# Patient Record
Sex: Male | Born: 1955
Health system: Southern US, Community
[De-identification: ages and names within clinical notes are randomized; demographics above are authoritative.]

## PROBLEM LIST (undated history)

## (undated) DIAGNOSIS — E785 Hyperlipidemia, unspecified: Secondary | ICD-10-CM

## (undated) DIAGNOSIS — R011 Cardiac murmur, unspecified: Secondary | ICD-10-CM

## (undated) DIAGNOSIS — F419 Anxiety disorder, unspecified: Secondary | ICD-10-CM

## (undated) DIAGNOSIS — I10 Essential (primary) hypertension: Secondary | ICD-10-CM

## (undated) DIAGNOSIS — K219 Gastro-esophageal reflux disease without esophagitis: Secondary | ICD-10-CM

## (undated) DIAGNOSIS — I350 Nonrheumatic aortic (valve) stenosis: Secondary | ICD-10-CM

## (undated) DIAGNOSIS — I447 Left bundle-branch block, unspecified: Secondary | ICD-10-CM

## (undated) DIAGNOSIS — I2510 Atherosclerotic heart disease of native coronary artery without angina pectoris: Secondary | ICD-10-CM

## (undated) HISTORY — DX: Essential (primary) hypertension: I10

## (undated) HISTORY — PX: OTHER SURGICAL HISTORY: SHX169

## (undated) HISTORY — DX: Anxiety disorder, unspecified: F41.9

## (undated) HISTORY — DX: Hyperlipidemia, unspecified: E78.5

## (undated) HISTORY — DX: Nonrheumatic aortic (valve) stenosis: I35.0

## (undated) HISTORY — PX: CARDIAC CATHETERIZATION: SHX172

## (undated) HISTORY — PX: WISDOM TOOTH EXTRACTION: SHX21

---

## 2000-12-15 ENCOUNTER — Encounter: Payer: Self-pay | Admitting: Family Medicine

## 2000-12-15 ENCOUNTER — Ambulatory Visit (HOSPITAL_COMMUNITY): Admission: RE | Admit: 2000-12-15 | Discharge: 2000-12-15 | Payer: Self-pay | Admitting: Family Medicine

## 2000-12-22 ENCOUNTER — Ambulatory Visit (HOSPITAL_COMMUNITY): Admission: RE | Admit: 2000-12-22 | Discharge: 2000-12-22 | Payer: Self-pay | Admitting: Pulmonary Disease

## 2004-04-18 ENCOUNTER — Emergency Department (HOSPITAL_COMMUNITY): Admission: EM | Admit: 2004-04-18 | Discharge: 2004-04-18 | Payer: Self-pay | Admitting: Emergency Medicine

## 2004-07-15 ENCOUNTER — Ambulatory Visit (HOSPITAL_COMMUNITY): Admission: RE | Admit: 2004-07-15 | Discharge: 2004-07-15 | Payer: Self-pay | Admitting: Family Medicine

## 2004-07-25 ENCOUNTER — Ambulatory Visit: Payer: Self-pay | Admitting: *Deleted

## 2004-07-26 ENCOUNTER — Ambulatory Visit: Payer: Self-pay | Admitting: *Deleted

## 2004-08-13 ENCOUNTER — Encounter (HOSPITAL_COMMUNITY): Admission: RE | Admit: 2004-08-13 | Discharge: 2004-08-16 | Payer: Self-pay | Admitting: *Deleted

## 2004-08-13 ENCOUNTER — Ambulatory Visit: Payer: Self-pay | Admitting: *Deleted

## 2004-08-23 ENCOUNTER — Ambulatory Visit: Payer: Self-pay | Admitting: *Deleted

## 2016-10-20 ENCOUNTER — Ambulatory Visit (INDEPENDENT_AMBULATORY_CARE_PROVIDER_SITE_OTHER): Payer: 59 | Admitting: Physician Assistant

## 2016-10-20 ENCOUNTER — Encounter: Payer: Self-pay | Admitting: Physician Assistant

## 2016-10-20 DIAGNOSIS — R011 Cardiac murmur, unspecified: Secondary | ICD-10-CM | POA: Diagnosis not present

## 2016-10-20 DIAGNOSIS — E785 Hyperlipidemia, unspecified: Secondary | ICD-10-CM | POA: Insufficient documentation

## 2016-10-20 DIAGNOSIS — I10 Essential (primary) hypertension: Secondary | ICD-10-CM | POA: Diagnosis not present

## 2016-10-20 MED ORDER — CLONIDINE HCL 0.1 MG PO TABS
0.1000 mg | ORAL_TABLET | Freq: Once | ORAL | Status: AC
  Administered 2016-10-20: 0.1 mg via ORAL

## 2016-10-20 MED ORDER — LISINOPRIL 20 MG PO TABS: 20.0000 mg | ORAL_TABLET | Freq: Every day | ORAL | 0 refills | Status: DC

## 2016-10-20 MED ORDER — AMLODIPINE BESYLATE 5 MG PO TABS
5.0000 mg | ORAL_TABLET | Freq: Every day | ORAL | 0 refills | Status: DC
Start: 2016-10-20 — End: 2016-11-03

## 2016-10-20 NOTE — Progress Notes (Signed)
Patient ID: Samuel Cook MRN: [id number], DOB: August 09, 1956, 61 y.o. Date of Encounter: @DATE @  Chief Complaint:  Chief Complaint  Patient presents with  . New Patient (Initial Visit)    HPI: 61 y.o. year old male  presents as a New Patient to Establish Care.   He was seeing Dr. Megan Mans in [place]. Last routine office visit there was in November. Says that he would see him routinely to follow-up his high blood pressure and high cholesterol. Says he has "white coat hypertension" and that "it took him 2 to 3 years to get comfortable enough with Dr. Megan Mans to where he did not have high blood pressure there." However, he also states that he has no blood pressure machine at home therefore was not checking his blood pressure anywhere else.  Says that he would go there for routine office visit every 6 months and would have complete physical exam every 12 months. "Would check prostate etc.".  [name] 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 Several of his coworkers see me and have recommended me as his provider.  He brings in his 3 prescription medicine bottles of the prescription medicines he takes on a daily basis. Crestor 20 mg daily  Ativan 0.5 mg daily Lisinopril 10 mg daily  Says that back in 2001 he was laid off from [employer] for 1-1/2 years. At that time provider felt that he was having some anxiety and depression. Says that at that time he was taking the lorazepam 4 times a day but now just takes it one every morning.  I saw some documentation in epic that indicated he had seen cardiology remotely. He says that remotely he went to the ER with chest pain and then follow-up they had him see cardiology but that was in negative evaluation.  Has no specific concerns to address today. States he has no other chronic medical problems that he knows of.  Noted that BP very high today. He states he is having no angina symptoms, even with exertion. No  stroke/TIA symptoms---no weakness in any extremity, no slurred speech etc    Past Medical History:  Diagnosis Date  . Anxiety   . Hyperlipidemia   . Hypertension      Home Meds: No outpatient prescriptions prior to visit.   No facility-administered medications prior to visit.       Allergies: No Known Allergies  Social History   Social History  . Marital status: Married    Spouse name: N/A  . Number of children: N/A  . Years of education: N/A   Occupational History  . Not on file.   Social History Main Topics  . Smoking status: Former Smoker    Quit date: 10/20/1993  . Smokeless tobacco: Former Neurosurgeon    Types: Snuff, Chew  . Alcohol use Yes     Comment: occassional  . Drug use: Yes    Types: Marijuana  . Sexual activity: Yes   Other Topics Concern  . Not on file   Social History Narrative  . No narrative on file    Family History  Problem Relation Age of Onset  . Cancer Mother   . Early death Mother   . Stroke Father   . Birth defects Maternal Aunt   . Hyperlipidemia Maternal Aunt   . Hearing loss Maternal Uncle   . Heart disease Maternal Grandmother      Review of Systems:  See HPI for pertinent ROS. All  other ROS negative.    Physical Exam: Blood pressure (!) 200/110, pulse 79, temperature 98.1 F (36.7 C), temperature source Oral, resp. rate 16, height 5\' 8"  (1.727 m), weight 195 lb 3.2 oz (88.5 kg), SpO2 98 %., Body mass index is 29.68 kg/m. General: WNWD WM. Appears in no acute distress. Neck: Supple. No thyromegaly. No lymphadenopathy. No carotid bruit Lungs: Clear bilaterally to auscultation without wheezes, rales, or rhonchi. Breathing is unlabored. Heart: RRR with S1 S2. III/VI murmur at apex Abdomen: Soft, non-tender, non-distended with normoactive bowel sounds. No hepatomegaly. No rebound/guarding. No obvious abdominal masses. Musculoskeletal:  Strength and tone normal for age. Extremities/Skin: Warm and dry.  No edema. Neuro: Alert  and oriented X 3. Moves all extremities spontaneously. Gait is normal. CNII-XII grossly in tact. Psych:  Responds to questions appropriately with a normal affect.     ASSESSMENT AND PLAN:  61 y.o. year old male with  1. Malignant hypertension Repeated BP myself---Left: 240/100---Right: 210/90 Gave Clonidine 0.1mg  here in the office to bring BP down.  Will increase dose of lisinopril from 10 to 20mg . Will add Norvasc 5mg .  Check CMET today F/U OV 2 weeks to recheck BP and BMET - lisinopril (PRINIVIL,ZESTRIL) 20 MG tablet; Take 1 tablet (20 mg total) by mouth daily.  Dispense: 30 tablet; Refill: 0 - amLODipine (NORVASC) 5 MG tablet; Take 1 tablet (5 mg total) by mouth daily.  Dispense: 30 tablet; Refill: 0  2. Essential hypertension Repeated BP myself---Left: 240/100---Right: 210/90 Gave Clonidine 0.1mg  here in the office to bring BP down.  Will increase dose of lisinopril from 10 to 20mg . Will add Norvasc 5mg .  Check CMET today F/U OV 2 weeks to recheck BP and BMET  - COMPLETE METABOLIC PANEL WITH GFR - lisinopril (PRINIVIL,ZESTRIL) 20 MG tablet; Take 1 tablet (20 mg total) by mouth daily.  Dispense: 30 tablet; Refill: 0 - amLODipine (NORVASC) 5 MG tablet; Take 1 tablet (5 mg total) by mouth daily.  Dispense: 30 tablet; Refill: 0  3. Hyperlipidemia, unspecified hyperlipidemia type He is not fasting. Will check LFTs to monitor. Wait to check FLP at future OV - COMPLETE METABOLIC PANEL WITH GFR  4. Murmur I asked if he had been told he had murmur in past, if he had had echo---says no. Obtain echo.  - ECHOCARDIOGRAM COMPLETE; Future  F/U OV 2 weeks to recheck BP and BMET  Signed, [address], [place], [facility] 10/20/2016 3:44 PM

## 2016-10-29 ENCOUNTER — Ambulatory Visit (HOSPITAL_COMMUNITY)
Admission: RE | Admit: 2016-10-29 | Discharge: 2016-10-29 | Disposition: A | Discharge status: Home / Self Care | Payer: 59 | Source: Ambulatory Visit | Attending: Physician Assistant | Admitting: Physician Assistant

## 2016-10-29 DIAGNOSIS — E785 Hyperlipidemia, unspecified: Secondary | ICD-10-CM | POA: Diagnosis not present

## 2016-10-29 DIAGNOSIS — I350 Nonrheumatic aortic (valve) stenosis: Secondary | ICD-10-CM | POA: Insufficient documentation

## 2016-10-29 DIAGNOSIS — F419 Anxiety disorder, unspecified: Secondary | ICD-10-CM | POA: Diagnosis not present

## 2016-10-29 DIAGNOSIS — R011 Cardiac murmur, unspecified: Secondary | ICD-10-CM

## 2016-10-29 DIAGNOSIS — I10 Essential (primary) hypertension: Secondary | ICD-10-CM | POA: Diagnosis not present

## 2016-10-29 DIAGNOSIS — Z87891 Personal history of nicotine dependence: Secondary | ICD-10-CM | POA: Diagnosis not present

## 2016-10-29 LAB — ECHOCARDIOGRAM COMPLETE
AO mean calculated velocity dopler: 225 cm/s
AOVTI: 63.9 cm
AV Area VTI index: 0.71 cm2/m2
AV Area VTI: 1.39 cm2
AV Area mean vel: 1.36 cm2
AV Mean grad: 23 mmHg
AV VEL mean LVOT/AV: 0.39
AV area mean vel ind: 0.67 cm2/m2
AV peak Index: 0.69
AVA: 1.44 cm2
AVLVOTPG: 7 mmHg
AVPG: 42 mmHg
AVPKVEL: 324 cm/s
Ao pk vel: 0.4 m/s
CHL CUP AV VEL: 1.44
CHL CUP MV DEC (S): 243
E decel time: 243 msec
E/e' ratio: 12.3
FS: 26 % — AB (ref 28–44)
IVS/LV PW RATIO, ED: 0.93
LA diam index: 1.63 cm/m2
LA vol index: 29.6 mL/m2
LASIZE: 33 mm
LAVOL: 59.7 mL
LAVOLA4C: 61.1 mL
LEFT ATRIUM END SYS DIAM: 33 mm
LV PW d: 13.7 mm — AB (ref 0.6–1.1)
LV TDI E'LATERAL: 7.29
LV TDI E'MEDIAL: 6.31
LVEEAVG: 12.3
LVEEMED: 12.3
LVELAT: 7.29 cm/s
LVOT VTI: 26.6 cm
LVOT area: 3.46 cm2
LVOT diameter: 21 mm
LVOT peak VTI: 0.42 cm
LVOT peak vel: 130 cm/s
LVOTSV: 92 mL
Lateral S' vel: 14.8 cm/s
MV pk E vel: 89.7 m/s
MVPG: 3 mmHg
MVPKAVEL: 123 m/s
TAPSE: 21.4 mm
Valve area index: 0.71

## 2016-10-29 NOTE — Progress Notes (Signed)
*  PRELIMINARY RESULTS* Echocardiogram 2D Echocardiogram has been performed.  Samuel Cook, Samuel Cook 10/29/2016, 12:22 PM

## 2016-11-03 ENCOUNTER — Ambulatory Visit (INDEPENDENT_AMBULATORY_CARE_PROVIDER_SITE_OTHER): Payer: 59 | Admitting: Physician Assistant

## 2016-11-03 ENCOUNTER — Encounter: Payer: Self-pay | Admitting: Physician Assistant

## 2016-11-03 VITALS — BP 200/102 | HR 88 | Temp 98.3°F | Resp 18 | Wt 190.4 lb

## 2016-11-03 DIAGNOSIS — E785 Hyperlipidemia, unspecified: Secondary | ICD-10-CM

## 2016-11-03 DIAGNOSIS — R011 Cardiac murmur, unspecified: Secondary | ICD-10-CM | POA: Diagnosis not present

## 2016-11-03 DIAGNOSIS — I350 Nonrheumatic aortic (valve) stenosis: Secondary | ICD-10-CM

## 2016-11-03 DIAGNOSIS — I10 Essential (primary) hypertension: Secondary | ICD-10-CM | POA: Diagnosis not present

## 2016-11-03 MED ORDER — AMLODIPINE BESYLATE 10 MG PO TABS: 10.0000 mg | ORAL_TABLET | Freq: Every day | ORAL | 3 refills | Status: DC

## 2016-11-03 MED ORDER — NEBIVOLOL HCL 5 MG PO TABS: 5.0000 mg | ORAL_TABLET | Freq: Every day | ORAL | 3 refills | Status: DC

## 2016-11-03 NOTE — Progress Notes (Signed)
Patient ID: Samuel Cook MRN: [id number], DOB: [birth year], 61 y.o. Date of Encounter: @DATE @  Chief Complaint:  Chief Complaint  Patient presents with  . Heart Murmur    f/u    HPI: 61 y.o. year old male     10/20/2016: presents as a New Patient to Establish Care.   He was seeing [place]. [name] in [place]. Last routine office visit there was in November. Says that he would see him routinely to follow-up his high blood pressure and high cholesterol. Says he has "white coat hypertension" and that "it took him 2 to 3 years to get comfortable enough with Dr. [name] to where he did not have high blood pressure there." However, he also states that he has no blood pressure machine at home therefore was not checking his blood pressure anywhere else.  Says that he would go there for routine office visit every 6 months and would have complete physical exam every 12 months. "Would check prostate etc.".  Works at [employer]. 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 Several of his coworkers see me and have recommended me as his provider.  He brings in his 3 prescription medicine bottles of the prescription medicines he takes on a daily basis. Crestor 20 mg daily  Ativan 0.5 mg daily Lisinopril 10 mg daily  Says that back in 2001 he was laid off from [employer] for 1-1/2 years. At that time provider felt that he was having some anxiety and depression. Says that at that time he was taking the lorazepam 4 times a day but now just takes it one every morning.  I saw some documentation in epic that indicated he had seen cardiology remotely. He says that remotely he went to the ER with chest pain and then follow-up they had him see cardiology but that was in negative evaluation.  Has no specific concerns to address today. States he has no other chronic medical problems that he knows of.  Noted that BP very high today. He states he is having no angina symptoms, even with exertion. No  stroke/TIA symptoms---no weakness in any extremity, no slurred speech etc  1. Malignant hypertension Repeated BP myself---Left: 240/100---Right: 210/90 Gave Clonidine 0.1mg  here in the office to bring BP down.  Will increase dose of lisinopril from 10 to 20mg . Will add Norvasc 5mg .  Check CMET today F/U OV 2 weeks to recheck BP and BMET - lisinopril (PRINIVIL,ZESTRIL) 20 MG tablet; Take 1 tablet (20 mg total) by mouth daily.  Dispense: 30 tablet; Refill: 0 - amLODipine (NORVASC) 5 MG tablet; Take 1 tablet (5 mg total) by mouth daily.  Dispense: 30 tablet; Refill: 0  2. Essential hypertension Repeated BP myself---Left: 240/100---Right: 210/90 Gave Clonidine 0.1mg  here in the office to bring BP down.  Will increase dose of lisinopril from 10 to 20mg . Will add Norvasc 5mg .  Check CMET today F/U OV 2 weeks to recheck BP and BMET  - COMPLETE METABOLIC PANEL WITH GFR - lisinopril (PRINIVIL,ZESTRIL) 20 MG tablet; Take 1 tablet (20 mg total) by mouth daily.  Dispense: 30 tablet; Refill: 0 - amLODipine (NORVASC) 5 MG tablet; Take 1 tablet (5 mg total) by mouth daily.  Dispense: 30 tablet; Refill: 0  3. Hyperlipidemia, unspecified hyperlipidemia type He is not fasting. Will check LFTs to monitor. Wait to check FLP at future OV - COMPLETE METABOLIC PANEL WITH GFR  4. Murmur I asked if he had been told he had murmur in past, if  he had had echo---says no. Obtain echo.  - ECHOCARDIOGRAM COMPLETE; Future  F/U OV 2 weeks to recheck BP and BMET    11/03/2016: Today he reports that he is taking the blood pressure medications as directed. He is having no adverse effects. Reports that he went and bought a blood pressure machine so that he can check blood pressure at home. He has written down some readings and has brought those in. First 3 readings documented are from 3/16. Then he has one reading for each day for 3/17, 3/18, and 3/19. 167/89, 171/81, 169/85, 137/84, 159/90, 169/91  He did go for  echocardiogram that was ordered at last visit. This was performed on 10/29/2016. The significant finding on this--- is of the aortic valve:  moderately to severely calcified annulus. Moderately thickened, moderately calcified leaflets. Moderate stenosis.  Has no complaints or concerns today. Just keeps talking about the fact that in the past his blood pressure has always been high at doctor's visits and that it took him several years to get comfortable enough with his prior provider to where his blood pressure was not high there. Continues to say that he feels perfectly fine with no symptoms.   Past Medical History:  Diagnosis Date  . Anxiety   . Hyperlipidemia   . Hypertension      Home Meds: Outpatient Medications Prior to Visit  Medication Sig Dispense Refill  . lisinopril (PRINIVIL,ZESTRIL) 20 MG tablet Take 1 tablet (20 mg total) by mouth daily. 30 tablet 0  . LORazepam (ATIVAN) 0.5 MG tablet Take 0.5 mg by mouth daily as needed for anxiety.    . rosuvastatin (CRESTOR) 20 MG tablet Take 20 mg by mouth daily.    Marland Kitchen amLODipine (NORVASC) 5 MG tablet Take 1 tablet (5 mg total) by mouth daily. 30 tablet 0   No facility-administered medications prior to visit.       Allergies: No Known Allergies  Social History   Social History  . Marital status: Married    Spouse name: N/A  . Number of children: N/A  . Years of education: N/A   Occupational History  . Not on file.   Social History Main Topics  . Smoking status: Former Smoker    Quit date: 10/20/1993  . Smokeless tobacco: Former Neurosurgeon    Types: Snuff, Chew  . Alcohol use Yes     Comment: occassional  . Drug use: Yes    Types: Marijuana  . Sexual activity: Yes   Other Topics Concern  . Not on file   Social History Narrative  . No narrative on file    Family History  Problem Relation Age of Onset  . Cancer Mother   . Early death Mother   . Stroke Father   . Birth defects Maternal Aunt   . Hyperlipidemia Maternal  Aunt   . Hearing loss Maternal Uncle   . Heart disease Maternal Grandmother      Review of Systems:  See HPI for pertinent ROS. All other ROS negative.    Physical Exam: Blood pressure (!) 200/102, pulse 88, temperature 98.3 F (36.8 C), temperature source Oral, resp. rate 18, weight 190 lb 6.4 oz (86.4 kg), SpO2 97 %., Body mass index is 28.95 kg/m. General: WNWD WM. Appears in no acute distress. Neck: Supple. No thyromegaly. No lymphadenopathy. No carotid bruit Lungs: Clear bilaterally to auscultation without wheezes, rales, or rhonchi. Breathing is unlabored. Heart: RRR with S1 S2. III/VI murmur at apex Abdomen: Soft, non-tender, non-distended with  normoactive bowel sounds. No hepatomegaly. No rebound/guarding. No obvious abdominal masses. Musculoskeletal:  Strength and tone normal for age. Extremities/Skin: Warm and dry.  No edema. Neuro: Alert and oriented X 3. Moves all extremities spontaneously. Gait is normal. CNII-XII grossly in tact. Psych:  Responds to questions appropriately with a normal affect.     ASSESSMENT AND PLAN:  61 y.o. year old male with   1. Malignant hypertension Continue lisinopril  20mg . Check BMET to f/u after increasing dose at LOV Will increase Norvasc to 10mg  QD Will add Bystolic 5mg  QD F/U OV 2 weeks.  Cont to check blood pressure at home and document readings and bring those to the next visit.  2. Essential hypertension  3. Hyperlipidemia, unspecified hyperlipidemia type He is not fasting. Will check LFTs to monitor. Wait to check FLP at future OV - COMPLETE METABOLIC PANEL WITH GFR  4. Murmur S/P Echo 10/29/2016-- showing aortic valve: Moderately to severely calcified annulus. Moderately thickened, moderately calcified leaflets. Moderate stenosis.  5. Aortic valve stenosis, etiology of cardiac valve disease unspecified S/P Echo 10/29/2016-- showing aortic valve: Moderately to severely calcified annulus. Moderately thickened, moderately  calcified leaflets. Moderate stenosis. Will refer him to cardiology for follow-up - Ambulatory referral to Cardiology   F/U OV 2 weeks to recheck BP. Once I get blood pressure controlled, will schedule him for CPE.  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  Once I get his blood pressure controlled, will have him come on a Friday for fasting labs and then follow-up appointment with me for complete physical exam the following week.  [address] [place], [place], [facility] 11/03/2016 4:39 PM

## 2016-11-04 LAB — COMPLETE METABOLIC PANEL WITH GFR
ALBUMIN: 5.1 g/dL (ref 3.6–5.1)
ALK PHOS: 61 U/L (ref 40–115)
ALT: 27 U/L (ref 9–46)
AST: 25 U/L (ref 10–35)
BUN: 19 mg/dL (ref 7–25)
CALCIUM: 9.9 mg/dL (ref 8.6–10.3)
CHLORIDE: 101 mmol/L (ref 98–110)
CO2: 26 mmol/L (ref 20–31)
Creat: 1.01 mg/dL (ref 0.70–1.25)
GFR, EST NON AFRICAN AMERICAN: 80 mL/min (ref >=60)
GFR, Est African American: >89 mL/min (ref >=60)
GLUCOSE: 111 mg/dL — AB (ref 70–99)
POTASSIUM: 4.5 mmol/L (ref 3.5–5.3)
SODIUM: 139 mmol/L (ref 135–146)
Total Bilirubin: 0.5 mg/dL (ref 0.2–1.2)
Total Protein: 7.9 g/dL (ref 6.1–8.1)

## 2016-11-11 ENCOUNTER — Encounter: Payer: Self-pay | Admitting: Cardiovascular Disease

## 2016-11-17 ENCOUNTER — Ambulatory Visit (INDEPENDENT_AMBULATORY_CARE_PROVIDER_SITE_OTHER): Payer: 59 | Admitting: Physician Assistant

## 2016-11-17 ENCOUNTER — Encounter: Payer: Self-pay | Admitting: Physician Assistant

## 2016-11-17 VITALS — BP 162/82 | HR 77 | Temp 98.0°F | Resp 16 | Wt 189.6 lb

## 2016-11-17 DIAGNOSIS — I10 Essential (primary) hypertension: Secondary | ICD-10-CM | POA: Diagnosis not present

## 2016-11-17 DIAGNOSIS — R011 Cardiac murmur, unspecified: Secondary | ICD-10-CM | POA: Diagnosis not present

## 2016-11-17 DIAGNOSIS — E785 Hyperlipidemia, unspecified: Secondary | ICD-10-CM | POA: Diagnosis not present

## 2016-11-17 DIAGNOSIS — I350 Nonrheumatic aortic (valve) stenosis: Secondary | ICD-10-CM | POA: Diagnosis not present

## 2016-11-17 MED ORDER — NEBIVOLOL HCL 10 MG PO TABS: 10.0000 mg | ORAL_TABLET | Freq: Every day | ORAL | 11 refills | Status: DC

## 2016-11-17 NOTE — Progress Notes (Signed)
Patient ID: Samuel Cook MRN: [id number], DOB: 12/15/1955, 61 y.o. Date of Encounter: @  Chief Complaint:  Chief Complaint  Patient presents with  . Hypertension    f/u    HPI: 61 y.o. year old male     10/20/2016: presents as a New Patient to Establish Care.   He was seeing Dr. Megan Cook in [place]. Last routine office visit there was in November. Says that he would see him routinely to follow-up his high blood pressure and high cholesterol. Says he has "white coat hypertension" and that "it took him 2 to 3 years to get comfortable enough with Dr. Megan Cook to where he did not have high blood pressure there." However, he also states that he has no blood pressure machine at home therefore was not checking his blood pressure anywhere else.  Says that he would go there for routine office visit every 6 months and would have complete physical exam every 12 months. "Would check prostate etc.".  [name] 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 Several of his coworkers see me and have recommended me as his provider.  He brings in his 3 prescription medicine bottles of the prescription medicines he takes on a daily basis. Crestor 20 mg daily  Ativan 0.5 mg daily Lisinopril 10 mg daily  Says that back in 2001 he was laid off from [employer] for 1-1/2 years. At that time provider felt that he was having some anxiety and depression. Says that at that time he was taking the lorazepam 4 times a day but now just takes it one every morning.  I saw some documentation in epic that indicated he had seen cardiology remotely. He says that remotely he went to the ER with chest pain and then follow-up they had him see cardiology but that was in negative evaluation.  Has no specific concerns to address today. States he has no other chronic medical problems that he knows of.  Noted that BP very high today. He states he is having no angina symptoms, even with exertion. No  stroke/TIA symptoms---no weakness in any extremity, no slurred speech etc  Malignant hypertension / Essential hypertension Repeated BP myself---Left: 240/100---Right: 210/90 Gave Clonidine 0.1mg  here in the office to bring BP down.  Will increase dose of lisinopril from 10 to . Will add Norvasc .  Check CMET today F/U OV 2 weeks to recheck BP and BMET - lisinopril (PRINIVIL,ZESTRIL) 20 MG tablet; Take 1 tablet (20 mg total) by mouth daily.  Dispense: 30 tablet; Refill: 0 - amLODipine (NORVASC) 5 MG tablet; Take 1 tablet (5 mg total) by mouth daily.  Dispense: 30 tablet; Refill: 0  Hyperlipidemia, unspecified hyperlipidemia type He is not fasting. Will check LFTs to monitor. Wait to check FLP at future OV - COMPLETE METABOLIC PANEL WITH GFR Murmur I asked if he had been told he had murmur in past, if he had had echo---says no. Obtain echo.  - ECHOCARDIOGRAM COMPLETE; Future   11/03/2016: Today he reports that he is taking the blood pressure medications as directed. He is having no adverse effects. Reports that he went and bought a blood pressure machine so that he can check blood pressure at home. He has written down some readings and has brought those in. First 3 readings documented are from 3/16. Then he has one reading for each day for 3/17, 3/18, and 3/19. 167/89, 171/81, 169/85, 137/84, 159/90, 169/91  He did go for echocardiogram that was  ordered at last visit. This was performed on 10/29/2016. The significant finding on this--- is of the aortic valve:  moderately to severely calcified annulus. Moderately thickened, moderately calcified leaflets. Moderate stenosis.  Has no complaints or concerns today. Just keeps talking about the fact that in the past his blood pressure has always been high at doctor's visits and that it took him several years to get comfortable enough with his prior provider to where his blood pressure was not high there. Continues to say that he feels  perfectly fine with no symptoms.  Malignant hypertension/ Essential hypertension Continue lisinopril  . Check BMET to f/u after increasing dose at LOV Will increase Norvasc to  QD Will add Bystolic  QD F/U OV 2 weeks.  Cont to check blood pressure at home and document readings and bring those to the next visit.  Hyperlipidemia, unspecified hyperlipidemia type He is not fasting. Will check LFTs to monitor. Wait to check FLP at future OV - COMPLETE METABOLIC PANEL WITH GFR  Murmur S/P Echo 10/29/2016-- showing aortic valve: Moderately to severely calcified annulus. Moderately thickened, moderately calcified leaflets. Moderate stenosis.  Aortic valve stenosis, etiology of cardiac valve disease unspecified S/P Echo 10/29/2016-- showing aortic valve: Moderately to severely calcified annulus. Moderately thickened, moderately calcified leaflets. Moderate stenosis. Will refer him to cardiology for follow-up - Ambulatory referral to Cardiology    11/17/2016: Today he brings in blood pressure log --has one reading per day. Readings are as follows: 194/90, 163/94, 172/96, 140/87, 162/95, 171/94, 129/83, 139/83, 134/82, 153/93 Today he reports that he has recently been noticing some discomfort in his left shoulder region. Sleeps on his left side--doesn't know if that is affecting this. He states that he has appointment to see Cardiology April 5. No other concerns to address today.     Past Medical History:  Diagnosis Date  . Anxiety   . Hyperlipidemia   . Hypertension      Home Meds: Outpatient Medications Prior to Visit  Medication Sig Dispense Refill  . amLODipine (NORVASC) 10 MG tablet Take 1 tablet (10 mg total) by mouth daily. 30 tablet 3  . lisinopril (PRINIVIL,ZESTRIL) 20 MG tablet Take 1 tablet (20 mg total) by mouth daily. 30 tablet 0  . LORazepam (ATIVAN) 0.5 MG tablet Take 0.5 mg by mouth daily as needed for anxiety.    . nebivolol (BYSTOLIC) 5 MG tablet Take 1  tablet (5 mg total) by mouth daily. 30 tablet 3  . rosuvastatin (CRESTOR) 20 MG tablet Take 20 mg by mouth daily.     No facility-administered medications prior to visit.       Allergies: No Known Allergies  Social History   Social History  . Marital status: Married    Spouse name: N/A  . Number of children: N/A  . Years of education: N/A   Occupational History  . Not on file.   Social History Main Topics  . Smoking status: Former Smoker    Quit date: 10/20/1993  . Smokeless tobacco: Former Neurosurgeon    Types: Snuff, Chew  . Alcohol use Yes     Comment: occassional  . Drug use: Yes    Types: Marijuana  . Sexual activity: Yes   Other Topics Concern  . Not on file   Social History Narrative  . No narrative on file    Family History  Problem Relation Age of Onset  . Cancer Mother   . Early death Mother   . Stroke Father   .  Birth defects Maternal Aunt   . Hyperlipidemia Maternal Aunt   . Hearing loss Maternal Uncle   . Heart disease Maternal Grandmother      Review of Systems:  See HPI for pertinent ROS. All other ROS negative.    Physical Exam: Blood pressure (!) 162/82, pulse 77, temperature 98 F (36.7 C), temperature source Oral, resp. rate 16, weight 189 lb 9.6 oz (86 kg), SpO2 98 %., Body mass index is 28.83 kg/m. General: WNWD WM. Appears in no acute distress. Neck: Supple. No thyromegaly. No lymphadenopathy. No carotid bruit Lungs: Clear bilaterally to auscultation without wheezes, rales, or rhonchi. Breathing is unlabored. Heart: RRR with S1 S2. III/VI murmur at apex Musculoskeletal:  Strength and tone normal for age. Extremities/Skin: Warm and dry.  No edema. Neuro: Alert and oriented X 3. Moves all extremities spontaneously. Gait is normal. CNII-XII grossly in tact. Psych:  Responds to questions appropriately with a normal affect.     ASSESSMENT AND PLAN:  61 y.o. year old male with   1. Essential hypertension 11/17/16: At the end of the visit I  was going over his AVS and discussed that the only medicine I was changing today is that I was increasing Bystolic to 10 mg.  He then notes that he doesn't know if he is taking Bystolic 5 mg currently-- thinks that he is already taking 10 mg dose.  Reviewed with him that I'm quite certain that it has to be the 5 mg dose as this is what is entered in the computer at the date of last visit.  He says that he will go home and check his medicine bottles and call me back.  Called back-- he reported taking other medicines but not Bystolic. He never started the Bystolic after last office visit. Told him to just go ahead and get the 10 mg dose of Bystolic filled and start taking Bystolic 10 mg daily.  2. Aortic valve stenosis, etiology of cardiac valve disease unspecified S/P Echo 10/29/2016-- showing aortic valve: Moderately to severely calcified annulus. Moderately thickened, moderately calcified leaflets. Moderate stenosis. Has appointment to follow-up with Cardiology 11/20/16   3. Hyperlipidemia, unspecified hyperlipidemia type 11/17/2016: On Crestor. I have checked LFTs to monitor. Waiting to do fasting lipid panel when he returns for physical in one month.  4. Murmur S/P Echo 10/29/2016-- showing aortic valve: Moderately to severely calcified annulus. Moderately thickened, moderately calcified leaflets. Moderate stenosis.  Left Shoulder Tendonitis: 11/17/2016: Gave handout -- he is to do stretches routinely.    He is scheduling his next visit as a complete physical exam. He is going to come fasting to that visit.  Murray Hodgkins [place], [place], [facility] 11/17/2016 8:27 AM

## 2016-11-18 ENCOUNTER — Other Ambulatory Visit: Payer: Self-pay | Admitting: Physician Assistant

## 2016-11-18 DIAGNOSIS — I10 Essential (primary) hypertension: Secondary | ICD-10-CM

## 2016-11-19 NOTE — Progress Notes (Signed)
Cardiology Office Note   Date:  11/20/2016   ID:  Samuel Cook, DOB [birth year], MRN [id number]  PCP:  Frazier Richards, PA-C  Cardiologist:   Charlton Haws, MD   No chief complaint on file.     History of Present Illness: Samuel Cook is a 61 y.o. male who presents for evaluation/consultation for aortic valve disease. Referred by [facility] Corrie Dandy Bet Durwin Nora PA-C Reviewed her notes from 3/19 and 4/2 Long standing Rx for HTN and elevated lipids has some anxiety and depression as well Works at [employer] She ordered echo for murmur and I have personally reviewed   Echo 10/29/16 EF 60-65%  Moderate LVH grade one diastolic Moderate AS peak velocity 3.24 m/sec mean gradient 23 mmHg  Peak 42 mmHg   He does not have chest pain , dyspnea or syncope Seen by [facility] before and no mention of murmur Long discussion with him about diagnosis, prognosis and natural history.   ECG with LBBB  No documented CAD   Past Medical History:  Diagnosis Date  . Anxiety   . Hyperlipidemia   . Hypertension     Past Surgical History:  Procedure Laterality Date  . none     none     Current Outpatient Prescriptions  Medication Sig Dispense Refill  . amLODipine (NORVASC) 10 MG tablet Take 1 tablet (10 mg total) by mouth daily. 30 tablet 3  . lisinopril (PRINIVIL,ZESTRIL) 20 MG tablet TAKE ONE TABLET BY MOUTH ONCE DAILY. 90 tablet 1  . LORazepam (ATIVAN) 0.5 MG tablet Take 0.5 mg by mouth daily as needed for anxiety.    . nebivolol (BYSTOLIC) 10 MG tablet Take 1 tablet (10 mg total) by mouth daily. 30 tablet 11  . rosuvastatin (CRESTOR) 20 MG tablet Take 20 mg by mouth daily.     No current facility-administered medications for this visit.     Allergies:   Patient has no known allergies.    Social History:  The patient  reports that he quit smoking about 23 years ago. He has quit using smokeless tobacco. His smokeless tobacco use included Snuff and Chew. He reports that he drinks  alcohol. He reports that he uses drugs, including Marijuana.   Family History:  The patient's family history includes Birth defects in his maternal aunt; Cancer in his mother; Early death in his mother; Hearing loss in his maternal uncle; Heart disease in his maternal grandmother; Hyperlipidemia in his maternal aunt; Stroke in his father.    ROS:  Please see the history of present illness.   Otherwise, review of systems are positive for none.   All other systems are reviewed and negative.    PHYSICAL EXAM: VS:  BP (!) 144/84 (BP Location: Right Arm)   Pulse 60   Ht  (1.778 m)   Wt 183 lb (83 kg)   SpO2 97%   BMI 26.26 kg/m  , BMI Body mass index is 26.26 kg/m. Affect appropriate Healthy:  appears stated age HEENT: normal Neck supple with no adenopathy JVP normal no bruits no thyromegaly Lungs clear with no wheezing and good diaphragmatic motion Heart:  S1/S2 preserved AS  murmur, no rub, gallop or click PMI normal Abdomen: benighn, BS positve, no tenderness, no AAA no bruit.  No HSM or HJR Distal pulses intact with no bruits No edema Neuro non-focal Skin warm and dry No muscular weakness    EKG:  SR LBBB    Recent Labs: 11/03/2016: ALT 27;  BUN 19; Creat 1.01; Potassium 4.5; Sodium 139    Lipid Panel No results found for: CHOL, TRIG, HDL, CHOLHDL, VLDL, LDLCALC, LDLDIRECT    Wt Readings from Last 3 Encounters:  11/20/16 183 lb (83 kg)  11/17/16 189 lb 9.6 oz (86 kg)  11/03/16 190 lb 6.4 oz (86.4 kg)      Other studies Reviewed: Additional studies/ records that were reviewed today include: Notes primary see above echo done March .    ASSESSMENT AND PLAN:  1.  Aortic stenosis moderate asymptomatic EF normal valve not bicuspid f/u echo in 6 months  2. HTN. Well controlled.  Continue current medications and low sodium Dash type diet.   3. Elevated Cholesterol on statin labs with primary 4. Anxiety/Depression seems compensated Rx per primary  5. LBBB:   Likely related to HTN and AS  Given moderate AS will have lexiscan myovue to r/o CAD ASA    Current medicines are reviewed at length with the patient today.  The patient does not have concerns regarding medicines.  The following changes have been made:  no change  Labs/ tests ordered today include: Echo in 6 months   Orders Placed This Encounter  Procedures  . NM Myocar Multi W/Spect W/Wall Motion / EF  . EKG 12-Lead  . ECHOCARDIOGRAM COMPLETE     Disposition:   FU with me in 6 months after echo     Signed, Charlton Haws, MD  11/20/2016 10:35 AM    [facility] [address] [place], [place], [place]  [address] Phone: [phone]; Fax: [phone]

## 2016-11-19 NOTE — Telephone Encounter (Signed)
Medication refilled per protocol. 

## 2016-11-20 ENCOUNTER — Encounter: Payer: Self-pay | Admitting: Cardiovascular Disease

## 2016-11-20 ENCOUNTER — Ambulatory Visit (INDEPENDENT_AMBULATORY_CARE_PROVIDER_SITE_OTHER): Payer: 59 | Admitting: Cardiovascular Disease

## 2016-11-20 VITALS — BP 144/84 | HR 60 | Ht 70.0 in | Wt 183.0 lb

## 2016-11-20 DIAGNOSIS — I447 Left bundle-branch block, unspecified: Secondary | ICD-10-CM | POA: Diagnosis not present

## 2016-11-20 DIAGNOSIS — I10 Essential (primary) hypertension: Secondary | ICD-10-CM

## 2016-11-20 DIAGNOSIS — R9431 Abnormal electrocardiogram [ECG] [EKG]: Secondary | ICD-10-CM | POA: Diagnosis not present

## 2016-11-20 DIAGNOSIS — I350 Nonrheumatic aortic (valve) stenosis: Secondary | ICD-10-CM

## 2016-11-20 NOTE — Patient Instructions (Signed)
Your physician wants you to follow-up in: 6 months Dr Haywood Filler will receive a reminder letter in the mail two months in advance. If you don't receive a letter, please call our office to schedule the follow-up appointment.   Your physician has requested that you have a lexiscan myoview. For further information please visit [email or web address] Please follow instruction sheet, as given.     Your physician has requested that you have an echocardiogram IN 6 MONTHS THE MORNING OF YOUR FOLLOW UP. Echocardiography is a painless test that uses sound waves to create images of your heart. It provides your doctor with information about the size and shape of your heart and how well your heart's chambers and valves are working. This procedure takes approximately one hour. There are no restrictions for this procedure.     Your physician recommends that you continue on your current medications as directed. Please refer to the Current Medication list given to you today.       Thank you for choosing [facility] !

## 2016-11-28 ENCOUNTER — Other Ambulatory Visit: Payer: Self-pay | Admitting: Physician Assistant

## 2016-11-28 ENCOUNTER — Other Ambulatory Visit: Payer: 59

## 2016-11-28 DIAGNOSIS — E785 Hyperlipidemia, unspecified: Secondary | ICD-10-CM

## 2016-11-28 DIAGNOSIS — I10 Essential (primary) hypertension: Secondary | ICD-10-CM

## 2016-11-28 DIAGNOSIS — Z79899 Other long term (current) drug therapy: Secondary | ICD-10-CM

## 2016-11-28 LAB — CBC WITH DIFFERENTIAL/PLATELET
BASOS ABS: 78 {cells}/uL (ref 0–200)
Basophils Relative: 1 %
EOS PCT: 4 %
Eosinophils Absolute: 312 cells/uL (ref 15–500)
HEMATOCRIT: 46.5 % (ref 38.5–50.0)
HEMOGLOBIN: 15.6 g/dL (ref 13.0–17.0)
LYMPHS ABS: 1794 {cells}/uL (ref 850–3900)
Lymphocytes Relative: 23 %
MCH: 33 pg (ref 27.0–33.0)
MCHC: 33.5 g/dL (ref 32.0–36.0)
MCV: 98.3 fL (ref 80.0–100.0)
MONO ABS: 780 {cells}/uL (ref 200–950)
MPV: 9.4 fL (ref 7.5–12.5)
Monocytes Relative: 10 %
NEUTROS ABS: 4836 {cells}/uL (ref 1500–7800)
Neutrophils Relative %: 62 %
Platelets: 270 10*3/uL (ref 140–400)
RBC: 4.73 MIL/uL (ref 4.20–5.80)
RDW: 12.9 % (ref 11.0–15.0)
WBC: 7.8 10*3/uL (ref 3.8–10.8)

## 2016-11-28 LAB — LIPID PANEL
Cholesterol: 135 mg/dL (ref <200)
HDL: 50 mg/dL (ref >40)
LDL Cholesterol: 73 mg/dL (ref <100)
Total CHOL/HDL Ratio: 2.7 Ratio (ref <5.0)
Triglycerides: 61 mg/dL (ref <150)
VLDL: 12 mg/dL (ref <30)

## 2016-12-02 LAB — TSH: TSH: 0.46 m[IU]/L (ref 0.40–4.50)

## 2016-12-02 LAB — PSA: PSA: 0.7 ng/mL (ref <=4.0)

## 2016-12-03 ENCOUNTER — Encounter (HOSPITAL_BASED_OUTPATIENT_CLINIC_OR_DEPARTMENT_OTHER)
Admission: RE | Admit: 2016-12-03 | Discharge: 2016-12-03 | Disposition: A | Discharge status: Home / Self Care | Payer: 59 | Source: Ambulatory Visit | Attending: Cardiovascular Disease | Admitting: Cardiovascular Disease

## 2016-12-03 ENCOUNTER — Encounter (HOSPITAL_COMMUNITY)
Admission: RE | Admit: 2016-12-03 | Discharge: 2016-12-03 | Disposition: A | Discharge status: Home / Self Care | Payer: 59 | Source: Ambulatory Visit | Attending: Cardiovascular Disease | Admitting: Cardiovascular Disease

## 2016-12-03 ENCOUNTER — Encounter (HOSPITAL_COMMUNITY): Payer: Self-pay

## 2016-12-03 DIAGNOSIS — I447 Left bundle-branch block, unspecified: Secondary | ICD-10-CM | POA: Diagnosis not present

## 2016-12-03 DIAGNOSIS — R9431 Abnormal electrocardiogram [ECG] [EKG]: Secondary | ICD-10-CM

## 2016-12-03 DIAGNOSIS — I350 Nonrheumatic aortic (valve) stenosis: Secondary | ICD-10-CM | POA: Diagnosis not present

## 2016-12-03 LAB — NM MYOCAR MULTI W/SPECT W/WALL MOTION / EF
CHL CUP RESTING HR STRESS: 49 {beats}/min
CSEPPHR: 83 {beats}/min
LV dias vol: 123 mL (ref 62–150)
LVSYSVOL: 56 mL
RATE: 0.36
SDS: 3
SRS: 10
SSS: 13
TID: 1.17

## 2016-12-03 MED ORDER — REGADENOSON 0.4 MG/5ML IV SOLN
INTRAVENOUS | Status: AC
  Administered 2016-12-03: 0.4 mg via INTRAVENOUS
  Filled 2016-12-03: qty 5

## 2016-12-03 MED ORDER — SODIUM CHLORIDE 0.9% FLUSH
INTRAVENOUS | Status: AC
  Administered 2016-12-03: 10 mL via INTRAVENOUS
  Filled 2016-12-03: qty 10

## 2016-12-03 MED ORDER — TECHNETIUM TC 99M TETROFOSMIN IV KIT
30.0000 | PACK | Freq: Once | INTRAVENOUS | Status: AC | PRN
  Administered 2016-12-03: 33 via INTRAVENOUS

## 2016-12-03 MED ORDER — TECHNETIUM TC 99M TETROFOSMIN IV KIT
10.0000 | PACK | Freq: Once | INTRAVENOUS | Status: AC | PRN
  Administered 2016-12-03: 11 via INTRAVENOUS

## 2016-12-06 ENCOUNTER — Other Ambulatory Visit: Payer: Self-pay | Admitting: Physician Assistant

## 2016-12-08 ENCOUNTER — Ambulatory Visit (HOSPITAL_COMMUNITY)
Admission: RE | Admit: 2016-12-08 | Discharge: 2016-12-08 | Disposition: A | Discharge status: Home / Self Care | Payer: 59 | Source: Ambulatory Visit | Attending: Adult Health | Admitting: Adult Health

## 2016-12-08 ENCOUNTER — Ambulatory Visit (INDEPENDENT_AMBULATORY_CARE_PROVIDER_SITE_OTHER): Payer: 59 | Admitting: Adult Health

## 2016-12-08 ENCOUNTER — Encounter: Payer: Self-pay | Admitting: Adult Health

## 2016-12-08 ENCOUNTER — Other Ambulatory Visit (HOSPITAL_COMMUNITY)
Admission: RE | Admit: 2016-12-08 | Discharge: 2016-12-08 | Disposition: A | Discharge status: Home / Self Care | Payer: 59 | Source: Ambulatory Visit | Attending: Adult Health | Admitting: Adult Health

## 2016-12-08 VITALS — BP 180/88 | HR 62 | Ht 70.0 in | Wt 187.0 lb

## 2016-12-08 DIAGNOSIS — E7800 Pure hypercholesterolemia, unspecified: Secondary | ICD-10-CM

## 2016-12-08 DIAGNOSIS — I10 Essential (primary) hypertension: Secondary | ICD-10-CM | POA: Diagnosis not present

## 2016-12-08 DIAGNOSIS — Z01818 Encounter for other preprocedural examination: Secondary | ICD-10-CM | POA: Insufficient documentation

## 2016-12-08 DIAGNOSIS — Z452 Encounter for adjustment and management of vascular access device: Secondary | ICD-10-CM | POA: Diagnosis not present

## 2016-12-08 DIAGNOSIS — R931 Abnormal findings on diagnostic imaging of heart and coronary circulation: Secondary | ICD-10-CM

## 2016-12-08 LAB — BASIC METABOLIC PANEL
ANION GAP: 8 (ref 5–15)
BUN: 17 mg/dL (ref 6–20)
CO2: 25 mmol/L (ref 22–32)
Calcium: 9.4 mg/dL (ref 8.9–10.3)
Chloride: 103 mmol/L (ref 101–111)
Creatinine, Ser: 1.03 mg/dL (ref 0.61–1.24)
GFR calc Af Amer: >60 mL/min (ref >60)
Glucose, Bld: 117 mg/dL — ABNORMAL HIGH (ref 65–99)
POTASSIUM: 3.8 mmol/L (ref 3.5–5.1)
SODIUM: 136 mmol/L (ref 135–145)

## 2016-12-08 LAB — CBC WITH DIFFERENTIAL/PLATELET
BASOS ABS: 0 10*3/uL (ref 0.0–0.1)
BASOS PCT: 0 %
Eosinophils Absolute: 0.1 10*3/uL (ref 0.0–0.7)
Eosinophils Relative: 1 %
HEMATOCRIT: 48.9 % (ref 39.0–52.0)
HEMOGLOBIN: 16.9 g/dL (ref 13.0–17.0)
LYMPHS PCT: 17 %
Lymphs Abs: 2.3 10*3/uL (ref 0.7–4.0)
MCH: 33.6 pg (ref 26.0–34.0)
MCHC: 34.6 g/dL (ref 30.0–36.0)
MCV: 97.2 fL (ref 78.0–100.0)
MONOS PCT: 8 %
Monocytes Absolute: 1 10*3/uL (ref 0.1–1.0)
NEUTROS ABS: 9.6 10*3/uL — AB (ref 1.7–7.7)
Neutrophils Relative %: 74 %
Platelets: 283 10*3/uL (ref 150–400)
RBC: 5.03 MIL/uL (ref 4.22–5.81)
RDW: 12 % (ref 11.5–15.5)
WBC: 13 10*3/uL — ABNORMAL HIGH (ref 4.0–10.5)

## 2016-12-08 LAB — PROTIME-INR
INR: 0.94
Prothrombin Time: 12.6 seconds (ref 11.4–15.2)

## 2016-12-08 MED ORDER — NITROGLYCERIN 0.4 MG SL SUBL
0.4000 mg | SUBLINGUAL_TABLET | SUBLINGUAL | 3 refills | Status: DC | PRN
Start: 2016-12-08 — End: 2016-12-31

## 2016-12-08 MED ORDER — ASPIRIN EC 81 MG PO TBEC: 81.0000 mg | DELAYED_RELEASE_TABLET | Freq: Every day | ORAL | 3 refills | Status: DC

## 2016-12-08 NOTE — Progress Notes (Signed)
Cardiology Office Note   Date:  12/08/2016   ID:  Cook, Samuel [birth year], MRN [id number]  PCP:  Frazier Richards, PA-C  Cardiologist:  Elzie Rings, NP   No chief complaint on file.     History of Present Illness: Samuel Cook is a 61 y.o. male who presents for ongoing assessment and management of aortic valve disease, hypertension, and hypercholesterolemia. Noncardiac issues include anxiety and depression. He was seen by Dr. Eden Emms on 11/20/2016 with complaints of chest discomfort. EKG revealed left bundle branch block, with no documented CAD. The patient was scheduled for Lexiscan Myoview to rule out CAD. And started on aspirin.  Stress MPI 12/03/2016   Findings consistent with prior large anteroseptal,septal,inferoseptal infarct with moderate peri-infarct ischemia. There is a moderate sized apical infarct with moderate peri-infarct ischemia.  This is a high risk study.  The left ventricular ejection fraction is normal (55-65%).  Per Dr. Eden Emms, patient is here to discuss cardiac catheterization.   Echocardiogram: 10/29/2016 Left ventricle: The cavity size was normal. Wall thickness was   increased in a pattern of moderate LVH. Systolic function was   normal. The estimated ejection fraction was in the range of 60%   to 65%. Doppler parameters are consistent with abnormal left   ventricular relaxation (grade 1 diastolic dysfunction).   Indeterminate filling pressures. - Ventricular septum: Septal motion showed abnormal function and   dyssynergy. These changes are consistent with intraventricular   conduction delay. - Aortic valve: Moderately to severely calcified annulus.   Moderately thickened, moderately calcified leaflets. There was   moderate stenosis. Peak velocity (S): 324 cm/s. Mean gradient   (S): 23 mm Hg. Valve area (VTI): 1.44 cm^2. Valve area (Vmax):   1.39 cm^2. Valve area (Vmean): 1.36 cm^2.  He comes today asymptomatic. He remains active. No  chest pain, but has had some fatigue and mild dyspnea.   Past Medical History:  Diagnosis Date  . Anxiety   . Hyperlipidemia   . Hypertension     Past Surgical History:  Procedure Laterality Date  . none     none     Current Outpatient Prescriptions  Medication Sig Dispense Refill  . amLODipine (NORVASC) 10 MG tablet Take 1 tablet (10 mg total) by mouth daily. 30 tablet 3  . lisinopril (PRINIVIL,ZESTRIL) 20 MG tablet TAKE ONE TABLET BY MOUTH ONCE DAILY. 90 tablet 1  . LORazepam (ATIVAN) 0.5 MG tablet Take 0.5 mg by mouth daily as needed for anxiety.    . nebivolol (BYSTOLIC) 10 MG tablet Take 1 tablet (10 mg total) by mouth daily. 30 tablet 11  . rosuvastatin (CRESTOR) 20 MG tablet TAKE ONE TABLET BY MOUTH ONCE DAILY. 30 tablet 1  . aspirin EC 81 MG tablet Take 1 tablet (81 mg total) by mouth daily. 90 tablet 3  . nitroGLYCERIN (NITROSTAT) 0.4 MG SL tablet Place 1 tablet (0.4 mg total) under the tongue every 5 (five) minutes as needed for chest pain. 25 tablet 3   No current facility-administered medications for this visit.     Allergies:   Patient has no known allergies.    Social History:  The patient  reports that he quit smoking about 23 years ago. He has quit using smokeless tobacco. His smokeless tobacco use included Snuff and Chew. He reports that he drinks alcohol. He reports that he uses drugs, including Marijuana.   Family History:  The patient's family history includes Birth defects in his maternal aunt; Cancer in his mother;  Early death in his mother; Hearing loss in his maternal uncle; Heart disease in his maternal grandmother; Hyperlipidemia in his maternal aunt; Stroke in his father.    ROS: All other systems are reviewed and negative. Unless otherwise mentioned in H&P    PHYSICAL EXAM: VS:  BP (!) 180/88   Pulse 62   Ht  (1.778 m)   Wt 187 lb (84.8 kg)   SpO2 96%   BMI 26.83 kg/m  , BMI Body mass index is 26.83 kg/m. GEN: Well nourished, well  developed, in no acute distress  HEENT: normal  Neck: no JVD, bilateral carotid bruits,L>R,  No masses Cardiac: RRR; 1/6 systolic murmur radiating into the carotids, no murmurs, rubs, or gallops,no edema  Respiratory:  Clear to auscultation bilaterally, normal work of breathing GI: soft, nontender, nondistended, + BS MS: no deformity or atrophy  Skin: warm and dry, no rash Neuro:  Strength and sensation are intact Psych: euthymic mood, full affect   EKG: Sinus bradycardia with LBBB.    Recent Labs: 11/03/2016: ALT 27; BUN 19; Creat 1.01; Potassium 4.5; Sodium 139 11/28/2016: Hemoglobin 15.6; Platelets 270; TSH 0.46    Lipid Panel    Component Value Date/Time   CHOL 135 11/28/2016 0844   TRIG 61 11/28/2016 0844   HDL 50 11/28/2016 0844   CHOLHDL 2.7 11/28/2016 0844   VLDL 12 11/28/2016 0844   LDLCALC 73 11/28/2016 0844      Wt Readings from Last 3 Encounters:  12/08/16 187 lb (84.8 kg)  11/20/16 183 lb (83 kg)  11/17/16 189 lb 9.6 oz (86 kg)     ASSESSMENT AND PLAN: 1. Abnormal Stress MPI: Findings consistent with prior large anteroseptal,septal,inferoseptal infarct with moderate peri-infarct ischemia. There is a moderate sized apical infarct with moderate peri-infarct ischemia. Discussed the test results with the patient and his wife. Discussed need for cardiac cath, procedure and risks. The patient understands that risks include but are not limited to stroke (1 in 1000), death (1 in 1000), kidney failure [usually temporary] (1 in 500), bleeding (1 in 200), allergic reaction [possibly serious] (1 in 200), and agrees to proceed.   Will plan cardiac cath on Friday, 4//27/2018, at 11:30 with Dr. Katrinka Blazing..Orders for labs and X-ray completed.  Will provide Rx for NTG sublingual and ASA 81 mg daily.   2. Hypertension: Hypertensive today. He is very anxious about test results. Continue amlodipine, lisinopril, nebivolol.    3. Anxiety: He takes lorazepam prn. May need to take this  prior to cath.   4. Hypercholesterolemia: Continue statin therapy.    Current medicines are reviewed at length with the patient today.    Labs/ tests ordered today include:   Orders Placed This Encounter  Procedures  . DG Chest 2 View  . CBC with Differential  . INR/PT  . Basic Metabolic Panel (BMET)     Disposition:   FU with cardiology after procedure.   Signed, Joni Reining, NP  12/08/2016 4:06 PM    [facility] [address], [place], [place] [address] Phone: [phone]; Fax: [phone]

## 2016-12-08 NOTE — Patient Instructions (Signed)
Medication Instructions:  START 81 MG ASPIRIN - ONCE DAILY   Labwork:TODAY CBC BMET PT/INR  Testing/Procedures: A chest x-ray takes a picture of the organs and structures inside the chest, including the heart, lungs, and blood vessels. This test can show several things, including, whether the heart is enlarges; whether fluid is building up in the lungs; and whether pacemaker / defibrillator leads are still in place.  Your physician has requested that you have a cardiac catheterization. Cardiac catheterization is used to diagnose and/or treat various heart conditions. Doctors may recommend this procedure for a number of different reasons. The most common reason is to evaluate chest pain. Chest pain can be a symptom of coronary artery disease (CAD), and cardiac catheterization can show whether plaque is narrowing or blocking your heart's arteries. This procedure is also used to evaluate the valves, as well as measure the blood flow and oxygen levels in different parts of your heart. For further information please visit [email or web address] Please follow instruction sheet, as given.    Follow-Up: Your physician recommends that you schedule a follow-up appointment in: TO BE DETERMINED AFTER CATH    Any Other Special Instructions Will Be Listed Below (If Applicable).     If you need a refill on your cardiac medications before your next appointment, please call your pharmacy.

## 2016-12-08 NOTE — Telephone Encounter (Signed)
Refill appropriate 

## 2016-12-12 ENCOUNTER — Ambulatory Visit (HOSPITAL_COMMUNITY)
Admission: RE | Admit: 2016-12-12 | Discharge: 2016-12-12 | Disposition: A | Discharge status: Home / Self Care | Payer: 59 | Source: Ambulatory Visit | Attending: Interventional Cardiology | Admitting: Interventional Cardiology

## 2016-12-12 ENCOUNTER — Encounter (HOSPITAL_COMMUNITY)
Admission: RE | Disposition: A | Discharge status: Home / Self Care | Payer: Self-pay | Source: Ambulatory Visit | Attending: Interventional Cardiology

## 2016-12-12 DIAGNOSIS — I350 Nonrheumatic aortic (valve) stenosis: Secondary | ICD-10-CM

## 2016-12-12 DIAGNOSIS — Z7982 Long term (current) use of aspirin: Secondary | ICD-10-CM | POA: Insufficient documentation

## 2016-12-12 DIAGNOSIS — E785 Hyperlipidemia, unspecified: Secondary | ICD-10-CM | POA: Diagnosis present

## 2016-12-12 DIAGNOSIS — I10 Essential (primary) hypertension: Secondary | ICD-10-CM | POA: Diagnosis present

## 2016-12-12 DIAGNOSIS — F1290 Cannabis use, unspecified, uncomplicated: Secondary | ICD-10-CM | POA: Diagnosis not present

## 2016-12-12 DIAGNOSIS — I25110 Atherosclerotic heart disease of native coronary artery with unstable angina pectoris: Secondary | ICD-10-CM | POA: Insufficient documentation

## 2016-12-12 DIAGNOSIS — E7800 Pure hypercholesterolemia, unspecified: Secondary | ICD-10-CM | POA: Insufficient documentation

## 2016-12-12 DIAGNOSIS — F419 Anxiety disorder, unspecified: Secondary | ICD-10-CM | POA: Diagnosis not present

## 2016-12-12 DIAGNOSIS — I447 Left bundle-branch block, unspecified: Secondary | ICD-10-CM | POA: Insufficient documentation

## 2016-12-12 DIAGNOSIS — Z8249 Family history of ischemic heart disease and other diseases of the circulatory system: Secondary | ICD-10-CM | POA: Diagnosis not present

## 2016-12-12 DIAGNOSIS — F329 Major depressive disorder, single episode, unspecified: Secondary | ICD-10-CM | POA: Insufficient documentation

## 2016-12-12 DIAGNOSIS — I2584 Coronary atherosclerosis due to calcified coronary lesion: Secondary | ICD-10-CM | POA: Insufficient documentation

## 2016-12-12 DIAGNOSIS — R9439 Abnormal result of other cardiovascular function study: Secondary | ICD-10-CM

## 2016-12-12 DIAGNOSIS — Z87891 Personal history of nicotine dependence: Secondary | ICD-10-CM | POA: Insufficient documentation

## 2016-12-12 DIAGNOSIS — I2510 Atherosclerotic heart disease of native coronary artery without angina pectoris: Secondary | ICD-10-CM

## 2016-12-12 HISTORY — PX: LEFT HEART CATH AND CORONARY ANGIOGRAPHY: CATH118249

## 2016-12-12 SURGERY — LEFT HEART CATH AND CORONARY ANGIOGRAPHY
Anesthesia: LOCAL

## 2016-12-12 MED ORDER — MIDAZOLAM HCL 2 MG/2ML IJ SOLN
INTRAMUSCULAR | Status: AC
  Filled 2016-12-12: qty 2

## 2016-12-12 MED ORDER — IOPAMIDOL (ISOVUE-370) INJECTION 76%
INTRAVENOUS | Status: DC | PRN
  Administered 2016-12-12: 120 mL via INTRA_ARTERIAL

## 2016-12-12 MED ORDER — SODIUM CHLORIDE 0.9% FLUSH: 3.0000 mL | INTRAVENOUS | Status: DC | PRN

## 2016-12-12 MED ORDER — HEPARIN (PORCINE) IN NACL 2-0.9 UNIT/ML-% IJ SOLN
INTRAMUSCULAR | Status: AC
  Filled 2016-12-12: qty 1000

## 2016-12-12 MED ORDER — ACETAMINOPHEN 325 MG PO TABS: 650.0000 mg | ORAL_TABLET | ORAL | Status: DC | PRN

## 2016-12-12 MED ORDER — IOPAMIDOL (ISOVUE-370) INJECTION 76%
INTRAVENOUS | Status: AC
  Filled 2016-12-12: qty 100

## 2016-12-12 MED ORDER — SODIUM CHLORIDE 0.9 % IV SOLN: 250.0000 mL | INTRAVENOUS | Status: DC | PRN

## 2016-12-12 MED ORDER — ASPIRIN 81 MG PO CHEW: 81.0000 mg | CHEWABLE_TABLET | ORAL | Status: DC

## 2016-12-12 MED ORDER — MIDAZOLAM HCL 2 MG/2ML IJ SOLN
INTRAMUSCULAR | Status: DC | PRN
  Administered 2016-12-12 (×2): 1 mg via INTRAVENOUS

## 2016-12-12 MED ORDER — FENTANYL CITRATE (PF) 100 MCG/2ML IJ SOLN
INTRAMUSCULAR | Status: DC | PRN
  Administered 2016-12-12: 50 ug via INTRAVENOUS

## 2016-12-12 MED ORDER — SODIUM CHLORIDE 0.9% FLUSH: 3.0000 mL | Freq: Two times a day (BID) | INTRAVENOUS | Status: DC

## 2016-12-12 MED ORDER — OXYCODONE-ACETAMINOPHEN 5-325 MG PO TABS: 1.0000 | ORAL_TABLET | ORAL | Status: DC | PRN

## 2016-12-12 MED ORDER — HEPARIN (PORCINE) IN NACL 2-0.9 UNIT/ML-% IJ SOLN
INTRAMUSCULAR | Status: DC | PRN
  Administered 2016-12-12: 1000 mL

## 2016-12-12 MED ORDER — LIDOCAINE HCL 1 % IJ SOLN
INTRAMUSCULAR | Status: AC
  Filled 2016-12-12: qty 40

## 2016-12-12 MED ORDER — SODIUM CHLORIDE 0.9 % WEIGHT BASED INFUSION
1.0000 mL/kg/h | INTRAVENOUS | Status: DC
Start: 2016-12-12 — End: 2016-12-12

## 2016-12-12 MED ORDER — LIDOCAINE HCL (PF) 1 % IJ SOLN
INTRAMUSCULAR | Status: DC | PRN
  Administered 2016-12-12: 2 mL

## 2016-12-12 MED ORDER — SODIUM CHLORIDE 0.9 % IV SOLN: INTRAVENOUS | Status: DC

## 2016-12-12 MED ORDER — SODIUM CHLORIDE 0.9 % WEIGHT BASED INFUSION
3.0000 mL/kg/h | INTRAVENOUS | Status: AC
  Administered 2016-12-12: 3 mL/kg/h via INTRAVENOUS

## 2016-12-12 MED ORDER — FENTANYL CITRATE (PF) 100 MCG/2ML IJ SOLN
INTRAMUSCULAR | Status: AC
  Filled 2016-12-12: qty 2

## 2016-12-12 MED ORDER — VERAPAMIL HCL 2.5 MG/ML IV SOLN
INTRAVENOUS | Status: DC | PRN
  Administered 2016-12-12: 15:00:00 via INTRA_ARTERIAL

## 2016-12-12 MED ORDER — ONDANSETRON HCL 4 MG/2ML IJ SOLN: 4.0000 mg | Freq: Four times a day (QID) | INTRAMUSCULAR | Status: DC | PRN

## 2016-12-12 MED ORDER — VERAPAMIL HCL 2.5 MG/ML IV SOLN
INTRAVENOUS | Status: AC
  Filled 2016-12-12: qty 2

## 2016-12-12 MED ORDER — ASPIRIN 81 MG PO CHEW: 81.0000 mg | CHEWABLE_TABLET | Freq: Every day | ORAL | Status: DC

## 2016-12-12 MED ORDER — HEPARIN SODIUM (PORCINE) 1000 UNIT/ML IJ SOLN
INTRAMUSCULAR | Status: AC
  Filled 2016-12-12: qty 1

## 2016-12-12 MED ORDER — HEPARIN SODIUM (PORCINE) 1000 UNIT/ML IJ SOLN
INTRAMUSCULAR | Status: DC | PRN
  Administered 2016-12-12: 4000 [IU] via INTRAVENOUS

## 2016-12-12 SURGICAL SUPPLY — 12 items
CATH INFINITI 5 FR JL3.5 (CATHETERS) ×1 IMPLANT
CATH INFINITI JR4 5F (CATHETERS) ×1 IMPLANT
COVER PRB 48X5XTLSCP FOLD TPE (BAG) IMPLANT
COVER PROBE 5X48 (BAG) ×2
DEVICE RAD COMP TR BAND LRG (VASCULAR PRODUCTS) ×1 IMPLANT
GLIDESHEATH SLEND A-KIT 6F 22G (SHEATH) ×1 IMPLANT
GUIDEWIRE INQWIRE 1.5J.035X260 (WIRE) IMPLANT
INQWIRE 1.5J .035X260CM (WIRE) ×2
KIT HEART LEFT (KITS) ×2 IMPLANT
PACK CARDIAC CATHETERIZATION (CUSTOM PROCEDURE TRAY) ×2 IMPLANT
TRANSDUCER W/STOPCOCK (MISCELLANEOUS) ×2 IMPLANT
TUBING CIL FLEX 10 FLL-RA (TUBING) ×2 IMPLANT

## 2016-12-12 NOTE — H&P (View-Only) (Signed)
Cardiology Office Note   Date:  12/08/2016   ID:  Samuel Cook, DOB 01/14/1956, MRN [id number]  PCP:  Samuel BETH, PA-C  Cardiologist:  Samuel Cook/ Samuel Dobrowolski, NP   No chief complaint on file.     History of Present Illness: Samuel Cook is a 60 y.o. male who presents for ongoing assessment and management of aortic valve disease, hypertension, and hypercholesterolemia. Noncardiac issues include anxiety and depression. He was seen by Dr. Nishan on 11/20/2016 with complaints of chest discomfort. EKG revealed left bundle branch block, with no documented CAD. The patient was scheduled for Lexiscan Myoview to rule out CAD. And started on aspirin.  Stress MPI 12/03/2016   Findings consistent with prior large anteroseptal,septal,inferoseptal infarct with moderate peri-infarct ischemia. There is a moderate sized apical infarct with moderate peri-infarct ischemia.  This is a high risk study.  The left ventricular ejection fraction is normal (55-65%).  Per Dr. Nishan, patient is here to discuss cardiac catheterization.   Echocardiogram: 10/29/2016 Left ventricle: The cavity size was normal. Wall thickness was   increased in a pattern of moderate LVH. Systolic function was   normal. The estimated ejection fraction was in the range of 60%   to 65%. Doppler parameters are consistent with abnormal left   ventricular relaxation (grade 1 diastolic dysfunction).   Indeterminate filling pressures. - Ventricular septum: Septal motion showed abnormal function and   dyssynergy. These changes are consistent with intraventricular   conduction delay. - Aortic valve: Moderately to severely calcified annulus.   Moderately thickened, moderately calcified leaflets. There was   moderate stenosis. Peak velocity (S): 324 cm/s. Mean gradient   (S): 23 mm Hg. Valve area (VTI): 1.44 cm^2. Valve area (Vmax):   1.39 cm^2. Valve area (Vmean): 1.36 cm^2.  He comes today asymptomatic. He remains active. No  chest pain, but has had some fatigue and mild dyspnea.   Past Medical History:  Diagnosis Date  . Anxiety   . Hyperlipidemia   . Hypertension     Past Surgical History:  Procedure Laterality Date  . none     none     Current Outpatient Prescriptions  Medication Sig Dispense Refill  . amLODipine (NORVASC) 10 MG tablet Take 1 tablet (10 mg total) by mouth daily. 30 tablet 3  . lisinopril (PRINIVIL,ZESTRIL) 20 MG tablet TAKE ONE TABLET BY MOUTH ONCE DAILY. 90 tablet 1  . LORazepam (ATIVAN) 0.5 MG tablet Take 0.5 mg by mouth daily as needed for anxiety.    . nebivolol (BYSTOLIC) 10 MG tablet Take 1 tablet (10 mg total) by mouth daily. 30 tablet 11  . rosuvastatin (CRESTOR) 20 MG tablet TAKE ONE TABLET BY MOUTH ONCE DAILY. 30 tablet 1  . aspirin EC 81 MG tablet Take 1 tablet (81 mg total) by mouth daily. 90 tablet 3  . nitroGLYCERIN (NITROSTAT) 0.4 MG SL tablet Place 1 tablet (0.4 mg total) under the tongue every 5 (five) minutes as needed for chest pain. 25 tablet 3   No current facility-administered medications for this visit.     Allergies:   Patient has no known allergies.    Social History:  The patient  reports that he quit smoking about 23 years ago. He has quit using smokeless tobacco. His smokeless tobacco use included Snuff and Chew. He reports that he drinks alcohol. He reports that he uses drugs, including Marijuana.   Family History:  The patient's family history includes Birth defects in his maternal aunt; Cancer in his mother;   Early death in his mother; Hearing loss in his maternal uncle; Heart disease in his maternal grandmother; Hyperlipidemia in his maternal aunt; Stroke in his father.    ROS: All other systems are reviewed and negative. Unless otherwise mentioned in H&P    PHYSICAL EXAM: VS:  BP (!) 180/88   Pulse 62   Ht  (1.778 m)   Wt 187 lb (84.8 kg)   SpO2 96%   BMI 26.83 kg/m  , BMI Body mass index is 26.83 kg/m. GEN: Well nourished, well  developed, in no acute distress  HEENT: normal  Neck: no JVD, bilateral carotid bruits,L>R,  No masses Cardiac: RRR; 1/6 systolic murmur radiating into the carotids, no murmurs, rubs, or gallops,no edema  Respiratory:  Clear to auscultation bilaterally, normal work of breathing GI: soft, nontender, nondistended, + BS MS: no deformity or atrophy  Skin: warm and dry, no rash Neuro:  Strength and sensation are intact Psych: euthymic mood, full affect   EKG: Sinus bradycardia with LBBB.    Recent Labs: 11/03/2016: ALT 27; BUN 19; Creat 1.01; Potassium 4.5; Sodium 139 11/28/2016: Hemoglobin 15.6; Platelets 270; TSH 0.46    Lipid Panel    Component Value Date/Time   CHOL 135 11/28/2016 0844   TRIG 61 11/28/2016 0844   HDL 50 11/28/2016 0844   CHOLHDL 2.7 11/28/2016 0844   VLDL 12 11/28/2016 0844   LDLCALC 73 11/28/2016 0844      Wt Readings from Last 3 Encounters:  12/08/16 187 lb (84.8 kg)  11/20/16 183 lb (83 kg)  11/17/16 189 lb 9.6 oz (86 kg)     ASSESSMENT AND PLAN: 1. Abnormal Stress MPI: Findings consistent with prior large anteroseptal,septal,inferoseptal infarct with moderate peri-infarct ischemia. There is a moderate sized apical infarct with moderate peri-infarct ischemia. Discussed the test results with the patient and his wife. Discussed need for cardiac cath, procedure and risks. The patient understands that risks include but are not limited to stroke (1 in 1000), death (1 in 1000), kidney failure [usually temporary] (1 in 500), bleeding (1 in 200), allergic reaction [possibly serious] (1 in 200), and agrees to proceed.   Will plan cardiac cath on Friday, 4//27/2018, at 11:30 with Dr. Katrinka Blazing..Orders for labs and X-ray completed.  Will provide Rx for NTG sublingual and ASA 81 mg daily.   2. Hypertension: Hypertensive today. He is very anxious about test results. Continue amlodipine, lisinopril, nebivolol.    3. Anxiety: He takes lorazepam prn. May need to take this  prior to cath.   4. Hypercholesterolemia: Continue statin therapy.    Current medicines are reviewed at length with the patient today.    Labs/ tests ordered today include:   Orders Placed This Encounter  Procedures  . DG Chest 2 View  . CBC with Differential  . INR/PT  . Basic Metabolic Panel (BMET)     Disposition:   FU with cardiology after procedure.   Signed, Joni Reining, NP  12/08/2016 4:06 PM    [facility] [address], [place], [place] [address] Phone: [phone]; Fax: [phone]

## 2016-12-12 NOTE — Interval H&P Note (Signed)
Cath Lab Visit (complete for each Cath Lab visit)  Clinical Evaluation Leading to the Procedure:   ACS: No.  Non-ACS:    Anginal Classification: CCS III  Anti-ischemic medical therapy: Minimal Therapy (1 class of medications)  Non-Invasive Test Results: High-risk stress test findings: cardiac mortality >3%/year  Prior CABG: No previous CABG      History and Physical Interval Note:  12/12/2016 2:43 PM  Samuel Cook  has presented today for surgery, with the diagnosis of unstable angina  The various methods of treatment have been discussed with the patient and family. After consideration of risks, benefits and other options for treatment, the patient has consented to  Procedure(s): Left Heart Cath and Coronary Angiography (N/A) as a surgical intervention .  The patient's history has been reviewed, patient examined, no change in status, stable for surgery.  I have reviewed the patient's chart and labs.  Questions were answered to the patient's satisfaction.     Lyn Records III

## 2016-12-12 NOTE — Discharge Instructions (Signed)
Radial Site Care °Refer to this sheet in the next few weeks. These instructions provide you with information about caring for yourself after your procedure. Your health care provider may also give you more specific instructions. Your treatment has been planned according to current medical practices, but problems sometimes occur. Call your health care provider if you have any problems or questions after your procedure. °What can I expect after the procedure? °After your procedure, it is typical to have the following: °· Bruising at the radial site that usually fades within 1-2 weeks. °· Blood collecting in the tissue (hematoma) that may be painful to the touch. It should usually decrease in size and tenderness within 1-2 weeks. °Follow these instructions at home: °· Take medicines only as directed by your health care provider. °· You may shower 24-48 hours after the procedure or as directed by your health care provider. Remove the bandage (dressing) and gently wash the site with plain soap and water. Pat the area dry with a clean towel. Do not rub the site, because this may cause bleeding. °· Do not take baths, swim, or use a hot tub until your health care provider approves. °· Check your insertion site every day for redness, swelling, or drainage. °· Do not apply powder or lotion to the site. °· Do not flex or bend the affected arm for 24 hours or as directed by your health care provider. °· Do not push or pull heavy objects with the affected arm for 24 hours or as directed by your health care provider. °· Do not lift over 10 lb (4.5 kg) for 5 days after your procedure or as directed by your health care provider. °· Ask your health care provider when it is okay to: °¨ Return to work or school. °¨ Resume usual physical activities or sports. °¨ Resume sexual activity. °· Do not drive home if you are discharged the same day as the procedure. Have someone else drive you. °· You may drive 24 hours after the procedure  unless otherwise instructed by your health care provider. °· Do not operate machinery or power tools for 24 hours after the procedure. °· If your procedure was done as an outpatient procedure, which means that you went home the same day as your procedure, a responsible adult should be with you for the first 24 hours after you arrive home. °· Keep all follow-up visits as directed by your health care provider. This is important. °Contact a health care provider if: °· You have a fever. °· You have chills. °· You have increased bleeding from the radial site. Hold pressure on the site. CALL 911 °Get help right away if: °· You have unusual pain at the radial site. °· You have redness, warmth, or swelling at the radial site. °· You have drainage (other than a small amount of blood on the dressing) from the radial site. °· The radial site is bleeding, and the bleeding does not stop after 30 minutes of holding steady pressure on the site. °· Your arm or hand becomes pale, cool, tingly, or numb. °This information is not intended to replace advice given to you by your health care provider. Make sure you discuss any questions you have with your health care provider. °Document Released: 09/06/2010 Document Revised: 01/10/2016 Document Reviewed: 02/20/2014 °Elsevier Interactive Patient Education © 2017 [employer] ° ° °

## 2016-12-15 ENCOUNTER — Encounter (HOSPITAL_COMMUNITY): Payer: Self-pay | Admitting: Interventional Cardiology

## 2016-12-16 ENCOUNTER — Encounter: Payer: Self-pay | Admitting: Cardiothoracic Surgery

## 2016-12-16 ENCOUNTER — Institutional Professional Consult (permissible substitution) (INDEPENDENT_AMBULATORY_CARE_PROVIDER_SITE_OTHER): Payer: 59 | Admitting: Cardiothoracic Surgery

## 2016-12-16 VITALS — BP 157/70 | HR 56 | Resp 16 | Ht 70.0 in | Wt 179.0 lb

## 2016-12-16 DIAGNOSIS — I2510 Atherosclerotic heart disease of native coronary artery without angina pectoris: Secondary | ICD-10-CM

## 2016-12-16 DIAGNOSIS — I350 Nonrheumatic aortic (valve) stenosis: Secondary | ICD-10-CM | POA: Diagnosis not present

## 2016-12-16 NOTE — Progress Notes (Signed)
[address]       [place] [address]             [phone]                    [name] Medical Record #[id number] Date of Birth: 1956/05/23  Referring: Lyn Records, MD Primary Care: Frazier Richards, PA-C  Chief Complaint:    Chief Complaint  Patient presents with  . Coronary Artery Disease     Surgical eval, ECHO 11/20/16, Cardiac Cath 12/12/16    History of Present Illness:    Samuel Cook 61 y.o. male is seen in the office  today for Consideration of coronary artery bypass grafting, patient also has noted an abnormal aortic valve. The patient denies any symptoms, he denies shortness of breath, denies angina, denies syncope, near syncope denies signs or symptoms of congestive heart failure. Last week he underwent cardiac catheterization after he had a stress test.       Current Activity/ Functional Status:  Patient is independent with mobility/ambulation, transfers, ADL's, IADL's.   Zubrod Score: At the time of surgery this patient's most appropriate activity status/level should be described as:     0    Normal activity, no symptoms     1    Restricted in physical strenuous activity but ambulatory, able to do out light work     2    Ambulatory and capable of self care, unable to do work activities, up and about               >50 % of waking hours                                  3    Only limited self care, in bed greater than 50% of waking hours     4    Completely disabled, no self care, confined to bed or chair     5    Moribund   Past Medical History:  Diagnosis Date  . Anxiety   . Hyperlipidemia   . Hypertension     Past Surgical History:  Procedure Laterality Date  . LEFT HEART CATH AND CORONARY ANGIOGRAPHY N/A 12/12/2016   Procedure: Left Heart Cath and Coronary Angiography;  Surgeon: Lyn Records, MD;  Location: [facility] INVASIVE CV LAB;  Service: Cardiovascular;  Laterality: N/A;  . none     none     Family History  Problem Relation Age of Onset  . Cancer Mother   . Early death Mother   . Stroke Father   . Birth defects Maternal Aunt   . Hyperlipidemia Maternal Aunt   . Hearing loss Maternal Uncle   . Heart disease Maternal Grandmother     Social History   Social History  . Marital status: Married    Spouse name: N/A  . Number of children: N/A  . Years of education: N/A   Occupational History  . Works as Psychologist, occupational    Social History Main Topics  . Smoking status: Former Smoker    Quit date: 10/20/1993  . Smokeless tobacco: Former Neurosurgeon    Types: Snuff, Chew  . Alcohol use Yes     Comment: occassional  . Drug use: Yes    Types: Marijuana  . Sexual activity: Yes   Other Topics Concern  . Not on file  Social History Narrative  . No narrative on file    History  Smoking Status  . Former Smoker  . Quit date: 10/20/1993  Smokeless Tobacco  . Former Neurosurgeon  . Types: Snuff, Chew    History  Alcohol Use  . Yes    Comment: occassional     No Known Allergies  Current Outpatient Prescriptions  Medication Sig Dispense Refill  . acetaminophen (TYLENOL) 500 MG tablet Take 1,000 mg by mouth 2 (two) times daily.    Marland Kitchen amLODipine (NORVASC) 10 MG tablet Take 1 tablet (10 mg total) by mouth daily. 30 tablet 3  . aspirin EC 81 MG tablet Take 1 tablet (81 mg total) by mouth daily. 90 tablet 3  . lisinopril (PRINIVIL,ZESTRIL) 20 MG tablet TAKE ONE TABLET BY MOUTH ONCE DAILY. 90 tablet 1  . LORazepam (ATIVAN) 0.5 MG tablet Take 0.5 mg by mouth daily as needed for anxiety.    . nebivolol (BYSTOLIC) 10 MG tablet Take 1 tablet (10 mg total) by mouth daily. 30 tablet 11  . nitroGLYCERIN (NITROSTAT) 0.4 MG SL tablet Place 1 tablet (0.4 mg total) under the tongue every 5 (five) minutes as needed for chest pain. 25 tablet 3  . Omega-3 Fatty Acids (FISH OIL) 1200 MG CAPS Take 1 capsule by mouth every evening.    Marland Kitchen omeprazole (PRILOSEC) 20 MG capsule Take 20 mg by mouth daily as  needed.    . rosuvastatin (CRESTOR) 20 MG tablet TAKE ONE TABLET BY MOUTH ONCE DAILY. 30 tablet 1   No current facility-administered medications for this visit.       Review of Systems:     Cardiac Review of Systems: Y or N  Chest Pain [ n   ]  Resting SOB [  n ] Exertional SOB  [ n ]  Orthopnea [n  ]   Pedal Edema [ n  ]    Palpitations [ n ] Syncope  [n  ]   Presyncope [ n  ]  General Review of Systems: [Y] = yes [  ]=no Constitional: recent weight change [ n ];  Wt loss over the last 3 months [   ] anorexia [  ]; fatigue [  ]; nausea [  ]; night sweats [  ]; fever [  ]; or chills [  ];          Dental: poor dentition[ y ]; Last Dentist visit: 5 yea4rs ago  Eye : blurred vision [  ]; diplopia [   ]; vision changes [  ];  Amaurosis fugax[  ]; Resp: cough [  ];  wheezing[  ];  hemoptysis[  ]; shortness of breath[  ]; paroxysmal nocturnal dyspnea[  ]; dyspnea on exertion[  ]; or orthopnea[  ];  GI:  gallstones[  ], vomiting[  ];  dysphagia[  ]; melena[  ];  hematochezia [  ]; heartburn[  ];   Hx of  Colonoscopy[n  ]; GU: kidney stones [  ]; hematuria[  ];   dysuria [  ];  nocturia[  ];  history of     obstruction [  ]; urinary frequency [  ]             Skin: rash, swelling[  ];, hair loss[  ];  peripheral edema[  ];  or itching[  ]; Musculosketetal: myalgias[  ];  joint swelling[  ];  joint erythema[  ];  joint pain[  ];  back pain[  ];  Heme/Lymph: bruising[  ];  bleeding[  ];  anemia[  ];  Neuro: TIA[  ];  headaches[  ];  stroke[  ];  vertigo[  ];  seizures[  ];   paresthesias[  ];  difficulty walking[  ];  Psych:depression[  ]; anxiety[  ];  Endocrine: diabetes[ n ];  thyroid dysfunction[ n ];  Immunizations: Flu up to date [  ]; Pneumococcal up to date [  ];  Other:  Physical Exam: BP (!) 157/70 (BP Location: Left Arm, Patient Position: Sitting, Cuff Size: Large)   Pulse (!) 56   Resp 16   Ht  (1.778 m)   Wt 179 lb (81.2 kg)   SpO2 95% Comment: RA  BMI 25.68 kg/m    PHYSICAL EXAMINATION: General appearance: alert, cooperative and no distress Head: Normocephalic, without obvious abnormality, atraumatic Neck: no adenopathy, no carotid bruit, no JVD, supple, symmetrical, trachea midline and thyroid not enlarged, symmetric, no tenderness/mass/nodules Lymph nodes: Cervical, supraclavicular, and axillary nodes normal. Resp: clear to auscultation bilaterally Back: symmetric, no curvature. ROM normal. No CVA tenderness. Cardio: systolic murmur: holosystolic 2/6, crescendo at lower left sternal border GI: soft, non-tender; bowel sounds normal; no masses,  no organomegaly Extremities: extremities normal, atraumatic, no cyanosis or edema and Homans sign is negative, no sign of DVT Neurologic: Grossly normal Patient has poor dentition with multiple upper broken off teeth  Diagnostic Studies & Laboratory data:     Recent Radiology Findings:   Dg Chest 2 View  Result Date: 12/09/2016 CLINICAL DATA:  Preop evaluation for upcoming cardiac catheterization EXAM: CHEST  2 VIEW COMPARISON:  07/15/2004 FINDINGS: The heart size and mediastinal contours are within normal limits. Both lungs are clear. The visualized skeletal structures are unremarkable. Bilateral nipple shadows are noted. IMPRESSION: No active cardiopulmonary disease. Electronically Signed   By: Alcide Clever M.D.   On: 12/09/2016 08:08   Nm Myocar Multi W/spect W/wall Motion / Ef  Result Date: 12/03/2016  Findings consistent with prior large anteroseptal,septal,inferoseptal infarct with moderate peri-infarct ischemia. There is a moderate sized apical infarct with moderate peri-infarct ischemia.  This is a high risk study.  The left ventricular ejection fraction is normal (55-65%).      I have independently reviewed the above radiologic studies.  Recent Lab Findings: Lab Results  Component Value Date   WBC 13.0 (H) 12/08/2016   HGB 16.9 12/08/2016   HCT 48.9 12/08/2016   PLT 283 12/08/2016    GLUCOSE 117 (H) 12/08/2016   CHOL 135 11/28/2016   TRIG 61 11/28/2016   HDL 50 11/28/2016   LDLCALC 73 11/28/2016   ALT 27 11/03/2016   AST 25 11/03/2016   NA 136 12/08/2016   K 3.8 12/08/2016   CL 103 12/08/2016   CREATININE 1.03 12/08/2016   BUN 17 12/08/2016   CO2 25 12/08/2016   TSH 0.46 11/28/2016   INR 0.94 12/08/2016   Procedures   Left Heart Cath and Coronary Angiography  Conclusion    Heavy coronary calcification throughout the left main and proximal to mid LAD.  Tubular 55-65% distal left main  Ostial 75% LAD  Ostial 90% first obtuse marginal  Nondominant right coronary with ostial 40-50% narrowing  Mild aortic stenosis with peak to peak gradient of 20 mmHg. Heavy calcification on echocardiography.  Normal LV systolic function with mildly elevated end-diastolic pressure consistent with diastolic heart failure.  RECOMMENDATION:  With the accompanying abnormal nuclear study demonstrating anterior wall and apical ischemia, the patient will be referred to [facility] for coronary bypass  grafting given distal left main and ostial LAD involvement.   The aortic valve is diseased but not severely stenosed. Recent echo did not demonstrate any significant aortic regurgitation. LV size and function were normal. It does not appear that valve replacement/therapy is indicated at this time but should be debated.    ------------------------------------------------------------------- LV EF: 60% -   65%  ------------------------------------------------------------------- Indications:      Murmur 785.2.  ------------------------------------------------------------------- History:   PMH:  Anxiety. Patient has a history of severe white coat hypertension. Pt reported no dizziness, headaches or double vision at the time of test. Former Smoker.  Risk factors: Hypertension. Dyslipidemia.  ------------------------------------------------------------------- Study Conclusions  -  Left ventricle: The cavity size was normal. Wall thickness was   increased in a pattern of moderate LVH. Systolic function was   normal. The estimated ejection fraction was in the range of 60%   to 65%. Doppler parameters are consistent with abnormal left   ventricular relaxation (grade 1 diastolic dysfunction).   Indeterminate filling pressures. - Ventricular septum: Septal motion showed abnormal function and   dyssynergy. These changes are consistent with intraventricular   conduction delay. - Aortic valve: Moderately to severely calcified annulus.   Moderately thickened, moderately calcified leaflets. There was   moderate stenosis. Peak velocity (S): 324 cm/s. Mean gradient   (S): 23 mm Hg. Valve area (VTI): 1.44 cm^2. Valve area (Vmax):   1.39 cm^2. Valve area (Vmean): 1.36 cm^2.  ------------------------------------------------------------------- Study data:  No prior study was available for comparison.  Study status:  Routine.  Procedure:  Transthoracic echocardiography. Image quality was adequate.          Transthoracic echocardiography.  M-mode, complete 2D, spectral Doppler, and color Doppler.  Birthdate:  Patient birthdate: [birth year].  Age:  Patient is 61 yr old.  Sex:  Gender: male.    BMI: 29.7 kg/m^2.  Blood pressure:     204/93  Patient status:  Inpatient.  Study date: Study date: 10/29/2016. Study time: 11:18 AM.  -------------------------------------------------------------------  ------------------------------------------------------------------- Left ventricle:  The cavity size was normal. Wall thickness was increased in a pattern of moderate LVH. Systolic function was normal. The estimated ejection fraction was in the range of 60% to 65%. Doppler parameters are consistent with abnormal left ventricular relaxation (grade 1 diastolic dysfunction). Indeterminate filling pressures.  ------------------------------------------------------------------- Aortic  valve:   Moderately to severely calcified annulus. Moderately thickened, moderately calcified leaflets.  Doppler: There was moderate stenosis.      VTI ratio of LVOT to aortic valve: 0.42. Valve area (VTI): 1.44 cm^2. Indexed valve area (VTI): 0.69 cm^2/m^2. Peak velocity ratio of LVOT to aortic valve: 0.4. Valve area (Vmax): 1.39 cm^2. Indexed valve area (Vmax): 0.67 cm^2/m^2. Mean velocity ratio of LVOT to aortic valve: 0.39. Valve area (Vmean): 1.36 cm^2. Indexed valve area (Vmean): 0.65 cm^2/m^2.    Mean gradient (S): 23 mm Hg. Peak gradient (S): 42 mm Hg.  ------------------------------------------------------------------- Aorta:  Aortic root: The aortic root was normal in size.  ------------------------------------------------------------------- Mitral valve:   Normal thickness leaflets .  Doppler:  There was no significant regurgitation.    Peak gradient (D): 3 mm Hg.  ------------------------------------------------------------------- Left atrium:  The atrium was normal in size.  ------------------------------------------------------------------- Atrial septum:  No defect or patent foramen ovale was identified.   ------------------------------------------------------------------- Right ventricle:  The cavity size was normal. Wall thickness was normal. Systolic function was normal.  ------------------------------------------------------------------- Ventricular septum:   Septal motion showed abnormal function and dyssynergy. These changes are consistent with intraventricular  conduction delay.  ------------------------------------------------------------------- Pulmonic valve:    The valve appears to be grossly normal. Doppler:  There was no significant regurgitation.  ------------------------------------------------------------------- Right atrium:  The atrium was normal in size.  ------------------------------------------------------------------- Systemic  veins: Inferior vena cava: The vessel was normal in size. The respirophasic diameter changes were in the normal range (>= 50%), consistent with normal central venous pressure.  ------------------------------------------------------------------- Measurements   Left ventricle                           Value          Reference  LV ID, ED, PLAX chordal          (L)     36.2  mm       43 - 52  LV ID, ES, PLAX chordal                  26.9  mm       23 - 38  LV fx shortening, PLAX chordal   (L)     26    %        >=29  LV PW thickness, ED                      13.7  mm       ----------  IVS/LV PW ratio, ED                      0.93           <=1.3  Stroke volume, 2D                        92    ml       ----------  Stroke volume/bsa, 2D                    44    ml/m^2   ----------  LV e&', lateral                           7.29  cm/s     ----------  LV E/e&', lateral                         12.3           ----------  LV e&', medial                            6.31  cm/s     ----------  LV E/e&', medial                          14.22          ----------  LV e&', average                           6.8   cm/s     ----------  LV E/e&', average                         13.19          ----------    Ventricular septum  Value          Reference  IVS thickness, ED                        12.7  mm       ----------    LVOT                                     Value          Reference  LVOT ID, S                               21    mm       ----------  LVOT area                                3.46  cm^2     ----------  LVOT peak velocity, S                    130   cm/s     ----------  LVOT mean velocity, S                    88.6  cm/s     ----------  LVOT VTI, S                              26.6  cm       ----------  LVOT peak gradient, S                    7     mm Hg    ----------    Aortic valve                             Value          Reference  Aortic valve peak velocity, S             324   cm/s     ----------  Aortic valve mean velocity, S            225   cm/s     ----------  Aortic valve VTI, S                      63.9  cm       ----------  Aortic mean gradient, S                  23    mm Hg    ----------  Aortic peak gradient, S                  42    mm Hg    ----------  VTI ratio, LVOT/AV                       0.42           ----------  Aortic valve area, VTI                   1.44  cm^2     ----------  Aortic valve area/bsa,  VTI               0.69  cm^2/m^2 ----------  Velocity ratio, peak, LVOT/AV            0.4            ----------  Aortic valve area, peak velocity         1.39  cm^2     ----------  Aortic valve area/bsa, peak              0.67  cm^2/m^2 ----------  velocity  Velocity ratio, mean, LVOT/AV            0.39           ----------  Aortic valve area, mean velocity         1.36  cm^2     ----------  Aortic valve area/bsa, mean              0.65  cm^2/m^2 ----------  velocity    Aorta                                    Value          Reference  Aortic root ID, ED                       27    mm       ----------    Left atrium                              Value          Reference  LA ID, A-P, ES                           33    mm       ----------  LA ID/bsa, A-P                           1.58  cm/m^2   <=2.2  LA volume, S                             59.7  ml       ----------  LA volume/bsa, S                         28.6  ml/m^2   ----------  LA volume, ES, 1-p A4C                   61.1  ml       ----------  LA volume/bsa, ES, 1-p A4C               29.3  ml/m^2   ----------  LA volume, ES, 1-p A2C                   57.5  ml       ----------  LA volume/bsa, ES, 1-p A2C               27.6  ml/m^2   ----------    Mitral valve  Value          Reference  Mitral E-wave peak velocity              89.7  cm/s     ----------  Mitral A-wave peak velocity              123   cm/s     ----------  Mitral deceleration time          (H)     243   ms       150 - 230  Mitral peak gradient, D                  3     mm Hg    ----------  Mitral E/A ratio, peak                   0.7            ----------    Right atrium                             Value          Reference  RA ID, S-I, ES, A4C                      35.9  mm       34 - 49  RA area, ES, A4C                         9.58  cm^2     8.3 - 19.5  RA volume, ES, A/L                       22.1  ml       ----------  RA volume/bsa, ES, A/L                   10.6  ml/m^2   ----------    Systemic veins                           Value          Reference  Estimated CVP                            3     mm Hg    ----------    Right ventricle                          Value          Reference  TAPSE                                    21.4  mm       ----------  RV s&', lateral, S                        14.8  cm/s     ----------  Legend: (L)  and  (H)  mark values outside specified reference range.  ------------------------------------------------------------------- Prepared and Electronically Authenticated by  Prentice Docker, MD 2018-03-14T14:53:52  Hemo Data    Most Recent Value  AO Systolic Pressure 132 mmHg  AO Diastolic Pressure 61 mmHg  AO Mean 87 mmHg  LV Systolic Pressure 151 mmHg  LV Diastolic Pressure 13 mmHg  LV EDP 21 mmHg  Arterial Occlusion Pressure Extended Systolic Pressure 131 mmHg  Arterial Occlusion Pressure Extended Diastolic Pressure 60 mmHg  Arterial Occlusion Pressure Extended Mean Pressure 86 mmHg  Left Ventricular Apex Extended Systolic Pressure 154 mmHg  Left Ventricular Apex Extended Diastolic Pressure 11 mmHg  Left Ventricular Apex Extended EDP Pressure 19 mmHg      Study Result    Findings consistent with prior large anteroseptal,septal,inferoseptal infarct with moderate peri-infarct ischemia. There is a moderate sized apical infarct with moderate peri-infarct ischemia.  This is a high risk study.  The left  ventricular ejection fraction is normal (55-65%).      Assessment / Plan:   Patient with asymptomatic coronary artery disease but positive stress test with three-vessel coronary artery disease, in addition he has a calcified aortic valve, with mild-to-moderate aortic stenosis. With the patient's poor dentition he's been referred to dental service prior to any consideration of aortic valve replacement.  After his dental issues have been resolved he will see me back and we will discuss proceeding with coronary artery bypass grafting plus or minus aortic valve replacement.     I  spent 40 minutes counseling the patient face to face and 50% or more the  time was spent in counseling and coordination of care. The total time spent in the appointment was 60 minutes.  Delight Ovens MD      [address] [place].[address] [place] [address] Office [phone]   Beeper [phone]  12/16/2016 3:24 PM

## 2016-12-16 NOTE — Patient Instructions (Addendum)
Coronary Artery Bypass Grafting Coronary artery bypass grafting (CABG) is a procedure to bypass or fix arteries of the heart (coronary arteries) that have become narrow or blocked. This narrowing is usually the result of a buildup of fatty deposits (plaques) in the walls of the vessels. The coronary arteries supply the heart with the oxygen and nutrients that it needs to pump blood to your body. In this procedure, a section of blood vessel from another part of the body (usually the chest, arm, or leg) is removed (harvested) and then inserted where it will allow blood to bypass the damaged part of the coronary artery. The harvested section of blood vessel is called the graft. Tell a health care provider about:  Any allergies you have.  All medicines you are taking or using, including steroids, blood thinners, vitamins, herbs, eye drops, creams, and over-the-counter medicines.  Any problems you or family members have had with anesthetic medicines.  Any blood disorders you have.  Any surgeries you have had.  Any medical conditions you have.  Whether you are pregnant or may be pregnant. What are the risks? Generally, this is a safe procedure. However, problems may occur, including:  Bleeding, which may require transfusions.  Infection.  Short term memory loss, confusion, and personality changes (cognitive dysfunction).  Pain at the surgical site.  Damage to other structures or organs.  Stroke.  Allergic reactions to medicines or dyes.  Heart attack during or after surgery.  Kidney failure. There may be additional risks and complications depending on where the veins are harvested from in your body for the procedure. What happens before the procedure? Staying hydrated  Follow instructions from your health care provider about hydration, which may include:  Up to 2 hours before the procedure - you may continue to drink clear liquids, such as water, clear fruit juice, black coffee,  and plain tea. Eating and drinking restrictions  Follow instructions from your health care provider about eating and drinking, which may include:  8 hours before the procedure - stop eating heavy meals or foods such as meat, fried foods, or fatty foods.  6 hours before the procedure - stop eating light meals or foods, such as toast or cereal.  6 hours before the procedure - stop drinking milk or drinks that contain milk.  2 hours before the procedure - stop drinking clear liquids. Medicine   Take over-the-counter and prescription medicines only as told by your health care provider.  Ask your health care provider about:  Changing or stopping your regular medicines. This is especially important if you are taking diabetes medicines or blood thinners. You may be asked to start new medicines and stop taking others. Do not stop medicines or adjust dosages on your own.  Taking medicines such as aspirin and ibuprofen. These medicines can thin your blood. Do not take these medicines before your procedure if your health care provider instructs you not to.  You may be given antibiotic medicine to help prevent infection. General instructions   Ask your health care provider how your surgical site will be marked or identified.  You may be asked to shower with a germ-killing soap.  For 3-6 weeks before the procedure, try not to use any products that contain nicotine or tobacco, such as cigarettes and e-cigarettes. Quitting smoking is one of the best things you can do for your heart health. If you need help quitting, ask your health care provider.  Talk with your health care provider about where graft  will come from. What happens during the procedure?  To reduce your risk of infection:  Your health care team will wash or sanitize their hands.  Your skin will be washed with soap.  Hair may be removed from the surgical area.  An IV tube will be inserted into one of your veins.  You will be  given one or more of the following:  A medicine to help you relax (sedative).  A medicine to make you fall asleep (general anesthetic).  A cut (incision) will be made down the front of the chest through the breastbone (sternum). The sternum will be spread open so your heart can be seen.  You may be placed on a heart-lung bypass machine. This machine will provide oxygen to your blood while the heart is undergoing surgery. Your surgeon may be able to do the surgery without the heart-lung bypass machine. That is called beating heart bypass surgery.  If a heart-lung bypass machine is needed, your heart will be temporarily stopped.  A section of blood vessel will be harvested from another part of your body (usually the chest, arm, or leg) and used to bypass the blocked arteries of your heart.  When the bypass is done, you will be taken off the heart-lung machine if it was used.  If your heart was stopped, it will be restarted and will take over again normally.  Your chest will be closed.  A bandage (dressing) will be placed over the incisions.  Tubes will remain in your chest and will be connected to a suction device to help drain fluid and reinflate the lungs. The procedure may vary among health care providers and hospitals. What happens after the procedure?  Your blood pressure, heart rate, breathing rate, and blood oxygen level will be monitored until the medicines you were given have worn off.  You may wake up with a tube in your throat to help your breathing. You may be connected to a breathing machine. You will not be able to talk while the tube is in place. The tube will be taken out as soon as it is safe.  You will be groggy and may have some pain. You will be given pain medicine to help control the pain.  You will be shown how to do deep breathing exercises. Summary  In this procedure, a section of blood vessel from another part of the body (usually the chest, arm, or leg) is  removed (harvested) and then inserted where it will allow blood to bypass the damaged part of the coronary artery. The harvested section of blood vessel is called the graft.  For 3-6 weeks before the procedure, try not to use any products that contain nicotine or tobacco, such as cigarettes and e-cigarettes. Quitting smoking is one of the best things you can do for your heart health. If you need help quitting, ask your health care provider.  You may be placed on a heart-lung bypass machine during the surgery. This machine will provide oxygen to your blood while the heart is undergoing surgery. Your surgeon may be able to do the surgery without the heart-lung bypass machine. That is called beating heart bypass surgery.  You may wake up with a tube in your throat to help your breathing. You may be connected to a breathing machine. You will not be able to talk while the tube is in place. The tube will be taken out as soon as it is safe. This information is not intended to replace  advice given to you by your health care provider. Make sure you discuss any questions you have with your health care provider. Document Released: 05/14/2005 Document Revised: 06/23/2016 Document Reviewed: 06/23/2016 Elsevier Interactive Patient Education  2017 [employer]  Coronary Artery Bypass Grafting, Care After This sheet gives you information about how to care for yourself after your procedure. Your health care provider may also give you more specific instructions. If you have problems or questions, contact your health care provider. What can I expect after the procedure? After the procedure, it is common to have:  Nausea and a lack of appetite.  Constipation.  Weakness and fatigue.  Depression or irritability.  Pain or discomfort in your incision areas. Follow these instructions at home: Medicines   Take over-the-counter and prescription medicines only as told by your health care provider. Do not stop  taking medicines or start any new medicines without approval from your health care provider.  If you were prescribed an antibiotic medicine, take it as told by your health care provider. Do not stop taking the antibiotic even if you start to feel better.  Do not drive or use heavy machinery while taking prescription pain medicine. Incision care   Follow instructions from your health care provider about how to take care of your incisions. Make sure you:  Wash your hands with soap and water before you change your bandage (dressing). If soap and water are not available, use hand sanitizer.  Change your dressing as told by your health care provider.  Leave stitches (sutures), skin glue, or adhesive strips in place. These skin closures may need to stay in place for 2 weeks or longer. If adhesive strip edges start to loosen and curl up, you may trim the loose edges. Do not remove adhesive strips completely unless your health care provider tells you to do that.  Keep incision areas clean, dry, and protected.  Check your incision areas every day for signs of infection. Check for:  More redness, swelling, or pain.  More fluid or blood.  Warmth.  Pus or a bad smell.  If incisions were made in your legs:  Avoid crossing your legs.  Avoid sitting for long periods of time. Change positions every 30 minutes.  Raise (elevate) your legs when you are sitting. Bathing   Do not take baths, swim, or use a hot tub until your health care provider approves.  Only take sponge baths. Pat the incisions dry. Do not rub incisions with a washcloth or towel.  Ask your health care provider when you can shower. Eating and drinking   Eat foods that are high in fiber, such as raw fruits and vegetables, whole grains, beans, and nuts. Meats should be lean cut. Avoid canned, processed, and fried foods. This can help prevent constipation and is a recommended part of a heart-healthy diet.  Drink enough fluid  to keep your urine clear or pale yellow.  Limit alcohol intake to no more than 1 drink a day for nonpregnant women and 2 drinks a day for men. One drink equals 12 oz of beer, 5 oz of wine, or 1 oz of hard liquor. Activity   Rest and limit your activity as told by your health care provider. You may be instructed to:  Stop any activity right away if you have chest pain, shortness of breath, irregular heartbeats, or dizziness. Get help right away if you have any of these symptoms.  Move around frequently for short periods or take short walks  as directed by your health care provider. Gradually increase your activities. You may need physical therapy or cardiac rehabilitation to help strengthen your muscles and build your endurance.  Avoid lifting, pushing, or pulling anything that is heavier than 10 lb (4.5 kg) for at least 6 weeks or as told by your health care provider.  Do not drive until your health care provider approves.  Ask your health care provider when you may return to work.  Ask your health care provider when you may resume sexual activity. General instructions   Do not use any products that contain nicotine or tobacco, such as cigarettes and e-cigarettes. If you need help quitting, ask your health care provider.  Take 2-3 deep breaths every few hours during the day, while you recover. This helps expand your lungs and prevent complications like pneumonia after surgery.  If you were given a device called an incentive spirometer, use it several times a day to practice deep breathing. Support your chest with a pillow or your arms when you take deep breaths or cough.  Wear compression stockings as told by your health care provider. These stockings help to prevent blood clots and reduce swelling in your legs.  Weigh yourself every day. This helps identify if your body is holding (retaining) fluid that may make your heart and lungs work harder.  Keep all follow-up visits as told by  your health care provider. This is important. Contact a health care provider if:  You have more redness, swelling, or pain around any incision.  You have more fluid or blood coming from any incision.  Any incision feels warm to the touch.  You have pus or a bad smell coming from any incision  You have a fever.  You have swelling in your ankles or legs.  You have pain in your legs.  You gain 2 lb (0.9 kg) or more a day.  You are nauseous or you vomit.  You have diarrhea. Get help right away if:  You have chest pain that spreads to your jaw or arms.  You are short of breath.  You have a fast or irregular heartbeat.  You notice a "clicking" in your breastbone (sternum) when you move.  You have numbness or weakness in your arms or legs.  You feel dizzy or light-headed. Summary  After the procedure, it is common to have pain or discomfort in the incision areas.  Do not take baths, swim, or use a hot tub until your health care provider approves.  Gradually increase your activities. You may need physical therapy or cardiac rehabilitation to help strengthen your muscles and build your endurance.  Weigh yourself every day. This helps identify if your body is holding (retaining) fluid that may make your heart and lungs work harder. This information is not intended to replace advice given to you by your health care provider. Make sure you discuss any questions you have with your health care provider. Document Released: 02/21/2005 Document Revised: 06/23/2016 Document Reviewed: 06/23/2016 Elsevier Interactive Patient Education  2017 [employer].

## 2016-12-18 ENCOUNTER — Encounter: Payer: 59 | Admitting: Physician Assistant

## 2016-12-22 ENCOUNTER — Telehealth (HOSPITAL_COMMUNITY): Payer: Self-pay | Admitting: Dentistry

## 2016-12-22 ENCOUNTER — Encounter: Payer: Self-pay | Admitting: Physician Assistant

## 2016-12-22 ENCOUNTER — Ambulatory Visit (INDEPENDENT_AMBULATORY_CARE_PROVIDER_SITE_OTHER): Payer: 59 | Admitting: Physician Assistant

## 2016-12-22 VITALS — BP 150/74 | HR 52 | Temp 98.0°F | Resp 14 | Wt 186.0 lb

## 2016-12-22 DIAGNOSIS — D72829 Elevated white blood cell count, unspecified: Secondary | ICD-10-CM

## 2016-12-22 LAB — CBC
HCT: 46 % (ref 38.5–50.0)
Hemoglobin: 16.3 g/dL (ref 13.0–17.0)
MCH: 34.8 pg — ABNORMAL HIGH (ref 27.0–33.0)
MCHC: 35.4 g/dL (ref 32.0–36.0)
MCV: 98.1 fL (ref 80.0–100.0)
MPV: 9.4 fL (ref 7.5–12.5)
PLATELETS: 272 10*3/uL (ref 140–400)
RBC: 4.69 MIL/uL (ref 4.20–5.80)
RDW: 12.1 % (ref 11.0–15.0)
WBC: 9.9 10*3/uL (ref 3.8–10.8)

## 2016-12-22 NOTE — Progress Notes (Signed)
Patient ID: Samuel Cook MRN: [id number], DOB: [birth year], 61 y.o. Date of Encounter: 12/22/2016, 12:17 PM    Chief Complaint:  Chief Complaint  Patient presents with  . white blood count off    need recheck      HPI: 61 y.o. year old male presents for above.    He recently had an office visit with Cardiology 11/20/16. Subsequently had abnormal nuclear scan. Then underwent cardiac catheterization 12/12/16.  Recently had surgical consultation as he was found to have three-vessel CAD and moderate aortic stenosis.  As part of his preop evaluation, labs showed elevated WBC. Patient felt that this might be secondary to his teeth.  He had a dental appointment with his dentist this past Thursday which would have been 12/18/16. He states that they did an x-ray which was okay.  They did feel that the plaque could be causing elevated white count.  States that they did a thorough cleaning and he was in there for a couple of hours. They also treated him with 4 tablets of penicillin.  He is here to recheck CBC to see if white count is back to normal after having dental cleaning and antibiotics at Dentist.  No other concerns to address today.     Home Meds:   Outpatient Medications Prior to Visit  Medication Sig Dispense Refill  . acetaminophen (TYLENOL) 500 MG tablet Take 1,000 mg by mouth 2 (two) times daily.    Marland Kitchen amLODipine (NORVASC) 10 MG tablet Take 1 tablet (10 mg total) by mouth daily. 30 tablet 3  . aspirin EC 81 MG tablet Take 1 tablet (81 mg total) by mouth daily. 90 tablet 3  . lisinopril (PRINIVIL,ZESTRIL) 20 MG tablet TAKE ONE TABLET BY MOUTH ONCE DAILY. 90 tablet 1  . LORazepam (ATIVAN) 0.5 MG tablet Take 0.5 mg by mouth daily as needed for anxiety.    . nebivolol (BYSTOLIC) 10 MG tablet Take 1 tablet (10 mg total) by mouth daily. 30 tablet 11  . nitroGLYCERIN (NITROSTAT) 0.4 MG SL tablet Place 1 tablet (0.4 mg total) under the tongue every 5 (five) minutes as needed for chest  pain. 25 tablet 3  . Omega-3 Fatty Acids (FISH OIL) 1200 MG CAPS Take 1 capsule by mouth every evening.    Marland Kitchen omeprazole (PRILOSEC) 20 MG capsule Take 20 mg by mouth daily as needed.    . rosuvastatin (CRESTOR) 20 MG tablet TAKE ONE TABLET BY MOUTH ONCE DAILY. 30 tablet 1   No facility-administered medications prior to visit.     Allergies: No Known Allergies    Review of Systems: See HPI for pertinent ROS. All other ROS negative.    Physical Exam: Blood pressure (!) 150/74, pulse (!) 52, temperature 98 F (36.7 C), temperature source Oral, resp. rate 14, weight 186 lb (84.4 kg), SpO2 98 %., Body mass index is 26.69 kg/m. General:  WNWD WM. Appears in no acute distress. Neck: Supple. No thyromegaly. No lymphadenopathy. Lungs: Clear bilaterally to auscultation without wheezes, rales, or rhonchi. Breathing is unlabored. Heart: Regular rhythm. Murmur. Msk:  Strength and tone normal for age. Extremities/Skin: Warm and dry. Neuro: Alert and oriented X 3. Moves all extremities spontaneously. Gait is normal. CNII-XII grossly in tact. Psych:  Responds to questions appropriately with a normal affect.   Results for orders placed or performed in visit on 12/22/16  CBC  Result Value Ref Range   WBC 9.9 3.8 - 10.8 K/uL   RBC 4.69 4.20 - 5.80 MIL/uL  Hemoglobin 16.3 13.0 - 17.0 g/dL   HCT 29.546.0 62.138.5 - 30.850.0 %   MCV 98.1 80.0 - 100.0 fL   MCH 34.8 (H) 27.0 - 33.0 pg   MCHC 35.4 32.0 - 36.0 g/dL   RDW 65.712.1 84.611.0 - 96.215.0 %   Platelets 272 140 - 400 K/uL   MPV 9.4 7.5 - 12.5 fL     ASSESSMENT AND PLAN:  61 y.o. year old male with  1. Leukocytosis, unspecified type - CBC WBC now down to 9.9--normal.  F/U with Dr. Tyrone SageGerhardt to let him know.  [address]igned, Mary Beth BedfordDixon, [place], [facility] 12/22/2016 12:17 PM

## 2016-12-22 NOTE — Telephone Encounter (Signed)
12/22/16 Called to schl. Dental Consult w/Dr. Kristin BruinsKulinski.  Pt. refused appt.  Pt. went to his primary dentist Dr. Tenny Crawoss. LRI

## 2016-12-23 ENCOUNTER — Other Ambulatory Visit: Payer: Self-pay | Admitting: *Deleted

## 2016-12-23 DIAGNOSIS — I2510 Atherosclerotic heart disease of native coronary artery without angina pectoris: Secondary | ICD-10-CM

## 2016-12-24 ENCOUNTER — Encounter: Payer: Self-pay | Admitting: Cardiothoracic Surgery

## 2016-12-24 ENCOUNTER — Ambulatory Visit (HOSPITAL_COMMUNITY)
Admission: RE | Admit: 2016-12-24 | Discharge: 2016-12-24 | Disposition: A | Discharge status: Home / Self Care | Payer: 59 | Source: Ambulatory Visit | Attending: Cardiothoracic Surgery | Admitting: Cardiothoracic Surgery

## 2016-12-24 ENCOUNTER — Ambulatory Visit (INDEPENDENT_AMBULATORY_CARE_PROVIDER_SITE_OTHER): Payer: 59 | Admitting: Cardiothoracic Surgery

## 2016-12-24 VITALS — BP 151/74 | HR 53 | Resp 16 | Ht 70.0 in | Wt 186.0 lb

## 2016-12-24 DIAGNOSIS — R9431 Abnormal electrocardiogram [ECG] [EKG]: Secondary | ICD-10-CM | POA: Insufficient documentation

## 2016-12-24 DIAGNOSIS — Z01812 Encounter for preprocedural laboratory examination: Secondary | ICD-10-CM | POA: Insufficient documentation

## 2016-12-24 DIAGNOSIS — I2510 Atherosclerotic heart disease of native coronary artery without angina pectoris: Secondary | ICD-10-CM | POA: Insufficient documentation

## 2016-12-24 DIAGNOSIS — Z01810 Encounter for preprocedural cardiovascular examination: Secondary | ICD-10-CM

## 2016-12-24 DIAGNOSIS — I350 Nonrheumatic aortic (valve) stenosis: Secondary | ICD-10-CM | POA: Diagnosis not present

## 2016-12-24 DIAGNOSIS — J9811 Atelectasis: Secondary | ICD-10-CM | POA: Diagnosis not present

## 2016-12-24 DIAGNOSIS — I447 Left bundle-branch block, unspecified: Secondary | ICD-10-CM

## 2016-12-24 DIAGNOSIS — I6523 Occlusion and stenosis of bilateral carotid arteries: Secondary | ICD-10-CM | POA: Insufficient documentation

## 2016-12-24 DIAGNOSIS — I5030 Unspecified diastolic (congestive) heart failure: Secondary | ICD-10-CM | POA: Diagnosis not present

## 2016-12-24 DIAGNOSIS — J449 Chronic obstructive pulmonary disease, unspecified: Secondary | ICD-10-CM

## 2016-12-24 DIAGNOSIS — Z736 Limitation of activities due to disability: Secondary | ICD-10-CM

## 2016-12-24 LAB — PULMONARY FUNCTION TEST
DL/VA % pred: 98 %
DL/VA: 4.53 ml/min/mmHg/L
DLCO unc % pred: 106 %
DLCO unc: 34.48 ml/min/mmHg
FEF 25-75 Post: 2.86 L/sec
FEF 25-75 Pre: 2.23 L/sec
FEF2575-%Change-Post: 28 %
FEF2575-%Pred-Post: 96 %
FEF2575-%Pred-Pre: 75 %
FEV1-%Change-Post: 6 %
FEV1-%Pred-Post: 104 %
FEV1-%Pred-Pre: 97 %
FEV1-Post: 3.74 L
FEV1-Pre: 3.51 L
FEV1FVC-%Change-Post: 4 %
FEV1FVC-%Pred-Pre: 91 %
FEV6-%Change-Post: 4 %
FEV6-%Pred-Post: 111 %
FEV6-%Pred-Pre: 106 %
FEV6-Post: 5.04 L
FEV6-Pre: 4.82 L
FEV6FVC-%Change-Post: 2 %
FEV6FVC-%Pred-Post: 103 %
FEV6FVC-%Pred-Pre: 100 %
FVC-%Change-Post: 1 %
FVC-%Pred-Post: 107 %
FVC-%Pred-Pre: 106 %
FVC-Post: 5.12 L
FVC-Pre: 5.04 L
Post FEV1/FVC ratio: 73 %
Post FEV6/FVC ratio: 98 %
Pre FEV1/FVC ratio: 70 %
Pre FEV6/FVC Ratio: 96 %
RV % pred: 102 %
RV: 2.3 L
TLC % pred: 107 %
TLC: 7.52 L

## 2016-12-24 MED ORDER — ALBUTEROL SULFATE (2.5 MG/3ML) 0.083% IN NEBU
2.5000 mg | INHALATION_SOLUTION | Freq: Once | RESPIRATORY_TRACT | Status: AC
  Administered 2016-12-24: 2.5 mg via RESPIRATORY_TRACT

## 2016-12-24 NOTE — Pre-Procedure Instructions (Signed)
    Loa SocksDale W Mcglaughlin  12/24/2016      [facility] - [place], [place] - [id number] [place] [id number] Phone: [phone] Fax: [phone]    Your procedure is scheduled on Friday, May 11th   [facility] at 5:30 Am.   Call this number if you have problems the morning of surgery:  [phone], other questions, call [phone] Mon - Fri from 8-4:30pm   Remember:  Do not eat food or drink liquids after midnight Thursday.   Take these medicines the morning of surgery with A SIP OF WATER : Bystolic, Norvasc, Omprazole              Stop taking any vitamins, herbal supplements, anti-inflammatories today  5-10   Do not wear jewelry - no rings or watches.  Do not wear lotions, colognes or deoderant.             Men may shave face and neck.   Do not bring valuables to the hospital.  [facility] is not responsible for any belongings or valuables.  Contacts, dentures or bridgework may not be worn into surgery.  Leave your suitcase in the car.  After surgery it may be brought to your room. For patients admitted to the hospital, discharge time will be determined by your treatment team.  Please read over the following fact sheets that you were given. Pain Booklet, MRSA Information and Surgical Site Infection Prevention

## 2016-12-24 NOTE — Progress Notes (Signed)
[address]       [place] [address]             [phone]                    [name] Medical Record #[id number] Date of Birth: 1955-12-24  Referring: Lyn Records, MD Primary Care: Deon Pilling  Chief Complaint:    Chief Complaint  Patient presents with  . Follow-up    further discuss CABG +or- AVR.Marland KitchenMarland KitchenNOTE FROM DENTIST    History of Present Illness:    Samuel Cook 61 y.o. male is seen in the office  today for Consideration of coronary artery bypass grafting because of positive stress test and cardiac cath evidence of left main obstruction earlier this week the patient discontinued his fish oil patient also has noted an abnormal aortic valve. The patient denies any symptoms, he denies shortness of breath, denies angina, denies syncope, near syncope denies signs or symptoms of congestive heart failure. Last week he underwent cardiac catheterization after he had a positive stress test. Since last week the patient has been seen by his dentist had Panorex and thorough cleaning, is not thought to have any active infection but does need crowns placed.      Current Activity/ Functional Status:  Patient is independent with mobility/ambulation, transfers, ADL's, IADL's.   Zubrod Score: At the time of surgery this patient's most appropriate activity status/level should be described as: [x]     0    Normal activity, no symptoms []     1    Restricted in physical strenuous activity but ambulatory, able to do out light work []     2    Ambulatory and capable of self care, unable to do work activities, up and about               >50 % of waking hours                              []     3    Only limited self care, in bed greater than 50% of waking hours []     4    Completely disabled, no self care, confined to bed or chair []     5    Moribund   Past Medical History:  Diagnosis Date  . Anxiety   . Hyperlipidemia   . Hypertension     Past  Surgical History:  Procedure Laterality Date  . LEFT HEART CATH AND CORONARY ANGIOGRAPHY N/A 12/12/2016   Procedure: Left Heart Cath and Coronary Angiography;  Surgeon: Lyn Records, MD;  Location: [facility] INVASIVE CV LAB;  Service: Cardiovascular;  Laterality: N/A;  . none     none    Family History  Problem Relation Age of Onset  . Cancer Mother   . Early death Mother   . Stroke Father   . Birth defects Maternal Aunt   . Hyperlipidemia Maternal Aunt   . Hearing loss Maternal Uncle   . Heart disease Maternal Grandmother     Social History   Social History  . Marital status: Married    Spouse name: N/A  . Number of children: N/A  . Years of education: N/A   Occupational History  . Works as Psychologist, occupational    Social History Main Topics  . Smoking status: Former Smoker    Quit date:  10/20/1993  . Smokeless tobacco: Former Neurosurgeon    Types: Snuff, Chew  . Alcohol use Yes     Comment: occassional  . Drug use: Yes    Types: Marijuana  . Sexual activity: Yes   Other Topics Concern  . Not on file   Social History Narrative  . No narrative on file    History  Smoking Status  . Former Smoker  . Quit date: 10/20/1993  Smokeless Tobacco  . Former Neurosurgeon  . Types: Snuff, Chew    History  Alcohol Use  . Yes    Comment: occassional     No Known Allergies  Current Outpatient Prescriptions  Medication Sig Dispense Refill  . acetaminophen (TYLENOL) 500 MG tablet Take 1,000 mg by mouth 2 (two) times daily.    Marland Kitchen amLODipine (NORVASC) 10 MG tablet Take 1 tablet (10 mg total) by mouth daily. 30 tablet 3  . aspirin EC 81 MG tablet Take 1 tablet (81 mg total) by mouth daily. 90 tablet 3  . lisinopril (PRINIVIL,ZESTRIL) 20 MG tablet TAKE ONE TABLET BY MOUTH ONCE DAILY. 90 tablet 1  . LORazepam (ATIVAN) 0.5 MG tablet Take 0.5 mg by mouth daily as needed for anxiety.    . nebivolol (BYSTOLIC) 10 MG tablet Take 1 tablet (10 mg total) by mouth daily. 30 tablet 11  . nitroGLYCERIN (NITROSTAT) 0.4  MG SL tablet Place 1 tablet (0.4 mg total) under the tongue every 5 (five) minutes as needed for chest pain. 25 tablet 3  . omeprazole (PRILOSEC) 20 MG capsule Take 20 mg by mouth daily as needed.    . rosuvastatin (CRESTOR) 20 MG tablet TAKE ONE TABLET BY MOUTH ONCE DAILY. 30 tablet 1  . Omega-3 Fatty Acids (FISH OIL) 1200 MG CAPS Take 1 capsule by mouth every evening.     No current facility-administered medications for this visit.    Earlier this week the patient discontinued his fish oil.   Review of Systems:     Cardiac Review of Systems: Y or N  Chest Pain [ n   ]  Resting SOB [  n ] Exertional SOB  [ n ]  Orthopnea [n  ]   Pedal Edema [ n  ]    Palpitations [ n ] Syncope  [n  ]   Presyncope [ n  ]  General Review of Systems: [Y] = yes [  ]=no Constitional: recent weight change [ n ];  Wt loss over the last 3 months [   ] anorexia [  ]; fatigue [  ]; nausea [  ]; night sweats [  ]; fever [  ]; or chills [  ];          Dental: poor dentition[ y ]; Last Dentist visit: 5 yea4rs ago  Eye : blurred vision [  ]; diplopia [   ]; vision changes [  ];  Amaurosis fugax[  ]; Resp: cough [  ];  wheezing[  ];  hemoptysis[  ]; shortness of breath[  ]; paroxysmal nocturnal dyspnea[  ]; dyspnea on exertion[  ]; or orthopnea[  ];  GI:  gallstones[  ], vomiting[  ];  dysphagia[  ]; melena[  ];  hematochezia [  ]; heartburn[  ];   Hx of  Colonoscopy[n  ]; GU: kidney stones [  ]; hematuria[  ];   dysuria [  ];  nocturia[  ];  history of     obstruction [  ]; urinary frequency [  ]  Skin: rash, swelling[  ];, hair loss[  ];  peripheral edema[  ];  or itching[  ]; Musculosketetal: myalgias[  ];  joint swelling[  ];  joint erythema[  ];  joint pain[  ];  back pain[  ];  Heme/Lymph: bruising[  ];  bleeding[  ];  anemia[  ];  Neuro: TIA[  ];  headaches[  ];  stroke[  ];  vertigo[  ];  seizures[  ];   paresthesias[  ];  difficulty walking[  ];  Psych:depression[  ]; anxiety[  ];  Endocrine:  diabetes[ n ];  thyroid dysfunction[ n ];  Immunizations: Flu up to date [  ]; Pneumococcal up to date [  ];  Other:  Physical Exam: BP (!) 151/74 (BP Location: Left Arm, Patient Position: Sitting, Cuff Size: Large)   Pulse (!) 53   Resp 16   Ht 5\' 10"  (1.778 m)   Wt 186 lb (84.4 kg)   SpO2 97% Comment: ON RA  BMI 26.69 kg/m   PHYSICAL EXAMINATION: General appearance: alert, cooperative and no distress Head: Normocephalic, without obvious abnormality, atraumatic Neck: no adenopathy, no carotid bruit, no JVD, supple, symmetrical, trachea midline and thyroid not enlarged, symmetric, no tenderness/mass/nodules Lymph nodes: Cervical, supraclavicular, and axillary nodes normal. Resp: clear to auscultation bilaterally Back: symmetric, no curvature. ROM normal. No CVA tenderness. Cardio: systolic murmur: holosystolic 2/6, crescendo at lower left sternal border GI: soft, non-tender; bowel sounds normal; no masses,  no organomegaly Extremities: extremities normal, atraumatic, no cyanosis or edema and Homans sign is negative, no sign of DVT Neurologic: Grossly normal Patient has poor dentition with multiple upper broken off teeth  Diagnostic Studies & Laboratory data:     Recent Radiology Findings:   Dg Chest 2 View  Result Date: 12/09/2016 CLINICAL DATA:  Preop evaluation for upcoming cardiac catheterization EXAM: CHEST  2 VIEW COMPARISON:  07/15/2004 FINDINGS: The heart size and mediastinal contours are within normal limits. Both lungs are clear. The visualized skeletal structures are unremarkable. Bilateral nipple shadows are noted. IMPRESSION: No active cardiopulmonary disease. Electronically Signed   By: Alcide Clever M.D.   On: 12/09/2016 08:08   Nm Myocar Multi W/spect W/wall Motion / Ef  Result Date: 12/03/2016  Findings consistent with prior large anteroseptal,septal,inferoseptal infarct with moderate peri-infarct ischemia. There is a moderate sized apical infarct with moderate  peri-infarct ischemia.  This is a high risk study.  The left ventricular ejection fraction is normal (55-65%).      I have independently reviewed the above radiologic studies.  Recent Lab Findings: Lab Results  Component Value Date   WBC 9.9 12/22/2016   HGB 16.3 12/22/2016   HCT 46.0 12/22/2016   PLT 272 12/22/2016   GLUCOSE 117 (H) 12/08/2016   CHOL 135 11/28/2016   TRIG 61 11/28/2016   HDL 50 11/28/2016   LDLCALC 73 11/28/2016   ALT 27 11/03/2016   AST 25 11/03/2016   NA 136 12/08/2016   K 3.8 12/08/2016   CL 103 12/08/2016   CREATININE 1.03 12/08/2016   BUN 17 12/08/2016   CO2 25 12/08/2016   TSH 0.46 11/28/2016   INR 0.94 12/08/2016   Procedures   Left Heart Cath and Coronary Angiography  Conclusion    Heavy coronary calcification throughout the left main and proximal to mid LAD.  Tubular 55-65% distal left main  Ostial 75% LAD  Ostial 90% first obtuse marginal  Nondominant right coronary with ostial 40-50% narrowing  Mild aortic stenosis with peak to  peak gradient of 20 mmHg. Heavy calcification on echocardiography.  Normal LV systolic function with mildly elevated end-diastolic pressure consistent with diastolic heart failure.  RECOMMENDATION:  With the accompanying abnormal nuclear study demonstrating anterior wall and apical ischemia, the patient will be referred to [facility] for coronary bypass grafting given distal left main and ostial LAD involvement.   The aortic valve is diseased but not severely stenosed. Recent echo did not demonstrate any significant aortic regurgitation. LV size and function were normal. It does not appear that valve replacement/therapy is indicated at this time but should be debated.    ------------------------------------------------------------------- LV EF: 60% -   65%  ------------------------------------------------------------------- Indications:      Murmur  785.2.  ------------------------------------------------------------------- History:   PMH:  Anxiety. Patient has a history of severe white coat hypertension. Pt reported no dizziness, headaches or double vision at the time of test. Former Smoker.  Risk factors: Hypertension. Dyslipidemia.  ------------------------------------------------------------------- Study Conclusions  - Left ventricle: The cavity size was normal. Wall thickness was   increased in a pattern of moderate LVH. Systolic function was   normal. The estimated ejection fraction was in the range of 60%   to 65%. Doppler parameters are consistent with abnormal left   ventricular relaxation (grade 1 diastolic dysfunction).   Indeterminate filling pressures. - Ventricular septum: Septal motion showed abnormal function and   dyssynergy. These changes are consistent with intraventricular   conduction delay. - Aortic valve: Moderately to severely calcified annulus.   Moderately thickened, moderately calcified leaflets. There was   moderate stenosis. Peak velocity (S): 324 cm/s. Mean gradient   (S): 23 mm Hg. Valve area (VTI): 1.44 cm^2. Valve area (Vmax):   1.39 cm^2. Valve area (Vmean): 1.36 cm^2.  ------------------------------------------------------------------- Study data:  No prior study was available for comparison.  Study status:  Routine.  Procedure:  Transthoracic echocardiography. Image quality was adequate.          Transthoracic echocardiography.  M-mode, complete 2D, spectral Doppler, and color Doppler.  Birthdate:  Patient birthdate: [birth year].  Age:  Patient is 61 yr old.  Sex:  Gender: male.    BMI: 29.7 kg/m^2.  Blood pressure:     204/93  Patient status:  Inpatient.  Study date: Study date: 10/29/2016. Study time: 11:18 AM.  -------------------------------------------------------------------  ------------------------------------------------------------------- Left ventricle:  The cavity size  was normal. Wall thickness was increased in a pattern of moderate LVH. Systolic function was normal. The estimated ejection fraction was in the range of 60% to 65%. Doppler parameters are consistent with abnormal left ventricular relaxation (grade 1 diastolic dysfunction). Indeterminate filling pressures.  ------------------------------------------------------------------- Aortic valve:   Moderately to severely calcified annulus. Moderately thickened, moderately calcified leaflets.  Doppler: There was moderate stenosis.      VTI ratio of LVOT to aortic valve: 0.42. Valve area (VTI): 1.44 cm^2. Indexed valve area (VTI): 0.69 cm^2/m^2. Peak velocity ratio of LVOT to aortic valve: 0.4. Valve area (Vmax): 1.39 cm^2. Indexed valve area (Vmax): 0.67 cm^2/m^2. Mean velocity ratio of LVOT to aortic valve: 0.39. Valve area (Vmean): 1.36 cm^2. Indexed valve area (Vmean): 0.65 cm^2/m^2.    Mean gradient (S): 23 mm Hg. Peak gradient (S): 42 mm Hg.  ------------------------------------------------------------------- Aorta:  Aortic root: The aortic root was normal in size.  ------------------------------------------------------------------- Mitral valve:   Normal thickness leaflets .  Doppler:  There was no significant regurgitation.    Peak gradient (D): 3 mm Hg.  ------------------------------------------------------------------- Left atrium:  The atrium was normal in size.  -------------------------------------------------------------------  Atrial septum:  No defect or patent foramen ovale was identified.   ------------------------------------------------------------------- Right ventricle:  The cavity size was normal. Wall thickness was normal. Systolic function was normal.  ------------------------------------------------------------------- Ventricular septum:   Septal motion showed abnormal function and dyssynergy. These changes are consistent with intraventricular conduction  delay.  ------------------------------------------------------------------- Pulmonic valve:    The valve appears to be grossly normal. Doppler:  There was no significant regurgitation.  ------------------------------------------------------------------- Right atrium:  The atrium was normal in size.  ------------------------------------------------------------------- Systemic veins: Inferior vena cava: The vessel was normal in size. The respirophasic diameter changes were in the normal range (>= 50%), consistent with normal central venous pressure.  ------------------------------------------------------------------- Measurements   Left ventricle                           Value          Reference  LV ID, ED, PLAX chordal          (L)     36.2  mm       43 - 52  LV ID, ES, PLAX chordal                  26.9  mm       23 - 38  LV fx shortening, PLAX chordal   (L)     26    %        >=29  LV PW thickness, ED                      13.7  mm       ----------  IVS/LV PW ratio, ED                      0.93           <=1.3  Stroke volume, 2D                        92    ml       ----------  Stroke volume/bsa, 2D                    44    ml/m^2   ----------  LV e&', lateral                           7.29  cm/s     ----------  LV E/e&', lateral                         12.3           ----------  LV e&', medial                            6.31  cm/s     ----------  LV E/e&', medial                          14.22          ----------  LV e&', average                           6.8   cm/s     ----------  LV E/e&', average  13.19          ----------    Ventricular septum                       Value          Reference  IVS thickness, ED                        12.7  mm       ----------    LVOT                                     Value          Reference  LVOT ID, S                               21    mm       ----------  LVOT area                                3.46  cm^2      ----------  LVOT peak velocity, S                    130   cm/s     ----------  LVOT mean velocity, S                    88.6  cm/s     ----------  LVOT VTI, S                              26.6  cm       ----------  LVOT peak gradient, S                    7     mm Hg    ----------    Aortic valve                             Value          Reference  Aortic valve peak velocity, S            324   cm/s     ----------  Aortic valve mean velocity, S            225   cm/s     ----------  Aortic valve VTI, S                      63.9  cm       ----------  Aortic mean gradient, S                  23    mm Hg    ----------  Aortic peak gradient, S                  42    mm Hg    ----------  VTI ratio, LVOT/AV                       0.42           ----------  Aortic valve area, VTI                   1.44  cm^2     ----------  Aortic valve area/bsa, VTI               0.69  cm^2/m^2 ----------  Velocity ratio, peak, LVOT/AV            0.4            ----------  Aortic valve area, peak velocity         1.39  cm^2     ----------  Aortic valve area/bsa, peak              0.67  cm^2/m^2 ----------  velocity  Velocity ratio, mean, LVOT/AV            0.39           ----------  Aortic valve area, mean velocity         1.36  cm^2     ----------  Aortic valve area/bsa, mean              0.65  cm^2/m^2 ----------  velocity    Aorta                                    Value          Reference  Aortic root ID, ED                       27    mm       ----------    Left atrium                              Value          Reference  LA ID, A-P, ES                           33    mm       ----------  LA ID/bsa, A-P                           1.58  cm/m^2   <=2.2  LA volume, S                             59.7  ml       ----------  LA volume/bsa, S                         28.6  ml/m^2   ----------  LA volume, ES, 1-p A4C                   61.1  ml       ----------  LA volume/bsa, ES, 1-p A4C               29.3   ml/m^2   ----------  LA volume, ES, 1-p A2C                   57.5  ml       ----------  LA volume/bsa, ES, 1-p A2C  27.6  ml/m^2   ----------    Mitral valve                             Value          Reference  Mitral E-wave peak velocity              89.7  cm/s     ----------  Mitral A-wave peak velocity              123   cm/s     ----------  Mitral deceleration time         (H)     243   ms       150 - 230  Mitral peak gradient, D                  3     mm Hg    ----------  Mitral E/A ratio, peak                   0.7            ----------    Right atrium                             Value          Reference  RA ID, S-I, ES, A4C                      35.9  mm       34 - 49  RA area, ES, A4C                         9.58  cm^2     8.3 - 19.5  RA volume, ES, A/L                       22.1  ml       ----------  RA volume/bsa, ES, A/L                   10.6  ml/m^2   ----------    Systemic veins                           Value          Reference  Estimated CVP                            3     mm Hg    ----------    Right ventricle                          Value          Reference  TAPSE                                    21.4  mm       ----------  RV s&', lateral, S                        14.8  cm/s     ----------  Legend: (L)  and  (H)  mark values outside specified reference range.  ------------------------------------------------------------------- Prepared and Electronically Authenticated by  Prentice Docker, MD 2018-03-14T14:53:52  Hemo Data    Most Recent Value  AO Systolic Pressure 132 mmHg  AO Diastolic Pressure 61 mmHg  AO Mean 87 mmHg  LV Systolic Pressure 151 mmHg  LV Diastolic Pressure 13 mmHg  LV EDP 21 mmHg  Arterial Occlusion Pressure Extended Systolic Pressure 131 mmHg  Arterial Occlusion Pressure Extended Diastolic Pressure 60 mmHg  Arterial Occlusion Pressure Extended Mean Pressure 86 mmHg  Left Ventricular Apex Extended Systolic Pressure  154 mmHg  Left Ventricular Apex Extended Diastolic Pressure 11 mmHg  Left Ventricular Apex Extended EDP Pressure 19 mmHg      Study Result    Findings consistent with prior large anteroseptal,septal,inferoseptal infarct with moderate peri-infarct ischemia. There is a moderate sized apical infarct with moderate peri-infarct ischemia.  This is a high risk study.  The left ventricular ejection fraction is normal (55-65%).      Assessment / Plan:   Patient with asymptomatic coronary artery disease but positive stress test with three-vessel coronary artery disease And left main obstruction, in addition he has a calcified aortic valve, with moderate aortic stenosis. Peak velocity (S): 324 cm/s. . I discussed the recommendation with the patient to proceed with coronary artery bypass grafting and likely aortic valve replacement. I discussed mechanical versus tissue valve with the patient is prefers a tissue valve, does not wish to take Coumadin. With his positive stress test and left main obstruction we'll plan to proceed with surgery later this week, it did not seem prudent to wait extended period time until the patient has dental caps placed. The risk of surgery including death infection stroke myocardial infarction bleeding blood transfusion possible need for permanent pacemaker all discussed in detail.    Delight Ovens MD      [address] [place] [place] [address] Office [phone]   Beeper [phone]  12/24/2016 5:07 PM

## 2016-12-25 ENCOUNTER — Ambulatory Visit (HOSPITAL_BASED_OUTPATIENT_CLINIC_OR_DEPARTMENT_OTHER)
Admission: RE | Admit: 2016-12-25 | Discharge: 2016-12-25 | Disposition: A | Discharge status: Home / Self Care | Payer: 59 | Source: Ambulatory Visit | Attending: Cardiothoracic Surgery | Admitting: Cardiothoracic Surgery

## 2016-12-25 ENCOUNTER — Ambulatory Visit (HOSPITAL_COMMUNITY): Payer: 59

## 2016-12-25 ENCOUNTER — Encounter (HOSPITAL_COMMUNITY): Payer: Self-pay | Admitting: Certified Registered Nurse Anesthetist

## 2016-12-25 ENCOUNTER — Other Ambulatory Visit: Payer: Self-pay | Admitting: *Deleted

## 2016-12-25 ENCOUNTER — Encounter (HOSPITAL_COMMUNITY): Payer: Self-pay

## 2016-12-25 ENCOUNTER — Ambulatory Visit (HOSPITAL_COMMUNITY)
Admission: RE | Admit: 2016-12-25 | Discharge: 2016-12-25 | Disposition: A | Discharge status: Home / Self Care | Payer: 59 | Source: Ambulatory Visit | Attending: Cardiothoracic Surgery | Admitting: Cardiothoracic Surgery

## 2016-12-25 ENCOUNTER — Encounter (HOSPITAL_COMMUNITY)
Admission: RE | Admit: 2016-12-25 | Discharge: 2016-12-25 | Disposition: A | Discharge status: Home / Self Care | Payer: 59 | Source: Ambulatory Visit | Attending: Cardiothoracic Surgery | Admitting: Cardiothoracic Surgery

## 2016-12-25 ENCOUNTER — Other Ambulatory Visit: Payer: Self-pay

## 2016-12-25 DIAGNOSIS — I2510 Atherosclerotic heart disease of native coronary artery without angina pectoris: Secondary | ICD-10-CM

## 2016-12-25 DIAGNOSIS — R911 Solitary pulmonary nodule: Secondary | ICD-10-CM

## 2016-12-25 DIAGNOSIS — J9811 Atelectasis: Secondary | ICD-10-CM | POA: Diagnosis not present

## 2016-12-25 DIAGNOSIS — R918 Other nonspecific abnormal finding of lung field: Secondary | ICD-10-CM

## 2016-12-25 DIAGNOSIS — I350 Nonrheumatic aortic (valve) stenosis: Secondary | ICD-10-CM | POA: Diagnosis not present

## 2016-12-25 DIAGNOSIS — J984 Other disorders of lung: Secondary | ICD-10-CM | POA: Diagnosis not present

## 2016-12-25 DIAGNOSIS — I5030 Unspecified diastolic (congestive) heart failure: Secondary | ICD-10-CM | POA: Diagnosis not present

## 2016-12-25 DIAGNOSIS — I700 Atherosclerosis of aorta: Secondary | ICD-10-CM | POA: Insufficient documentation

## 2016-12-25 HISTORY — DX: Cardiac murmur, unspecified: R01.1

## 2016-12-25 HISTORY — DX: Atherosclerotic heart disease of native coronary artery without angina pectoris: I25.10

## 2016-12-25 HISTORY — DX: Gastro-esophageal reflux disease without esophagitis: K21.9

## 2016-12-25 LAB — ABO/RH: ABO/RH(D): A POS

## 2016-12-25 LAB — CBC
HCT: 47.1 % (ref 39.0–52.0)
Hemoglobin: 16.2 g/dL (ref 13.0–17.0)
MCH: 33.1 pg (ref 26.0–34.0)
MCHC: 34.4 g/dL (ref 30.0–36.0)
MCV: 96.1 fL (ref 78.0–100.0)
Platelets: 219 10*3/uL (ref 150–400)
RBC: 4.9 MIL/uL (ref 4.22–5.81)
RDW: 11.9 % (ref 11.5–15.5)
WBC: 11.7 10*3/uL — ABNORMAL HIGH (ref 4.0–10.5)

## 2016-12-25 LAB — COMPREHENSIVE METABOLIC PANEL
ALT: 43 U/L (ref 17–63)
AST: 30 U/L (ref 15–41)
Albumin: 4.2 g/dL (ref 3.5–5.0)
Alkaline Phosphatase: 57 U/L (ref 38–126)
Anion gap: 10 (ref 5–15)
BUN: 15 mg/dL (ref 6–20)
CO2: 22 mmol/L (ref 22–32)
Calcium: 9.4 mg/dL (ref 8.9–10.3)
Chloride: 103 mmol/L (ref 101–111)
Creatinine, Ser: 0.82 mg/dL (ref 0.61–1.24)
GFR calc Af Amer: >60 mL/min (ref >60)
GFR calc non Af Amer: >60 mL/min (ref >60)
Glucose, Bld: 112 mg/dL — ABNORMAL HIGH (ref 65–99)
Potassium: 4.2 mmol/L (ref 3.5–5.1)
Sodium: 135 mmol/L (ref 135–145)
Total Bilirubin: 0.7 mg/dL (ref 0.3–1.2)
Total Protein: 7 g/dL (ref 6.5–8.1)

## 2016-12-25 LAB — URINALYSIS, ROUTINE W REFLEX MICROSCOPIC
Bilirubin Urine: NEGATIVE
Glucose, UA: NEGATIVE mg/dL
Hgb urine dipstick: NEGATIVE
Ketones, ur: NEGATIVE mg/dL
Leukocytes, UA: NEGATIVE
Nitrite: NEGATIVE
Protein, ur: NEGATIVE mg/dL
Specific Gravity, Urine: 1.016 (ref 1.005–1.030)
pH: 5 (ref 5.0–8.0)

## 2016-12-25 LAB — BLOOD GAS, ARTERIAL
Acid-Base Excess: 1 mmol/L (ref 0.0–2.0)
Bicarbonate: 24.7 mmol/L (ref 20.0–28.0)
FIO2: 21
O2 Saturation: 97.1 %
Patient temperature: 98.6
pCO2 arterial: 36.4 mmHg (ref 32.0–48.0)
pH, Arterial: 7.447 (ref 7.350–7.450)
pO2, Arterial: 90.9 mmHg (ref 83.0–108.0)

## 2016-12-25 LAB — TYPE AND SCREEN
ABO/RH(D): A POS
Antibody Screen: NEGATIVE

## 2016-12-25 LAB — VAS US DOPPLER PRE CABG
LEFT ECA DIAS: -21 cm/s
LEFT VERTEBRAL DIAS: 13 cm/s
Left CCA dist dias: 15 cm/s
Left CCA dist sys: 73 cm/s
Left CCA prox dias: 13 cm/s
Left CCA prox sys: 109 cm/s
Left ICA dist dias: -21 cm/s
Left ICA dist sys: -56 cm/s
Left ICA prox dias: -19 cm/s
Left ICA prox sys: -71 cm/s
RIGHT ECA DIAS: -19 cm/s
RIGHT VERTEBRAL DIAS: 12 cm/s
Right CCA prox dias: -17 cm/s
Right CCA prox sys: -87 cm/s
Right cca dist sys: -99 cm/s

## 2016-12-25 LAB — APTT: aPTT: 29 seconds (ref 24–36)

## 2016-12-25 LAB — PROTIME-INR
INR: 0.97
Prothrombin Time: 12.8 seconds (ref 11.4–15.2)

## 2016-12-25 LAB — SURGICAL PCR SCREEN
MRSA, PCR: NEGATIVE
STAPHYLOCOCCUS AUREUS: NEGATIVE

## 2016-12-25 MED ORDER — CHLORHEXIDINE GLUCONATE 0.12 % MT SOLN
15.0000 mL | Freq: Once | OROMUCOSAL | Status: AC
  Administered 2016-12-26: 15 mL via OROMUCOSAL
  Filled 2016-12-25: qty 15

## 2016-12-25 MED ORDER — POTASSIUM CHLORIDE 2 MEQ/ML IV SOLN
80.0000 meq | INTRAVENOUS | Status: DC
  Filled 2016-12-25: qty 40

## 2016-12-25 MED ORDER — METOPROLOL TARTRATE 12.5 MG HALF TABLET: 12.5000 mg | ORAL_TABLET | Freq: Once | ORAL | Status: DC

## 2016-12-25 MED ORDER — TRANEXAMIC ACID (OHS) PUMP PRIME SOLUTION
2.0000 mg/kg | INTRAVENOUS | Status: DC
  Filled 2016-12-25: qty 1.68

## 2016-12-25 MED ORDER — DOPAMINE-DEXTROSE 3.2-5 MG/ML-% IV SOLN
0.0000 ug/kg/min | INTRAVENOUS | Status: DC
  Filled 2016-12-25: qty 250

## 2016-12-25 MED ORDER — PLASMA-LYTE 148 IV SOLN
INTRAVENOUS | Status: AC
  Administered 2016-12-26: 500 mL
  Filled 2016-12-25: qty 2.5

## 2016-12-25 MED ORDER — DEXMEDETOMIDINE HCL IN NACL 400 MCG/100ML IV SOLN
0.1000 ug/kg/h | INTRAVENOUS | Status: AC
  Administered 2016-12-26: .3 ug/kg/h via INTRAVENOUS
  Filled 2016-12-25: qty 100

## 2016-12-25 MED ORDER — CEFUROXIME SODIUM 750 MG IJ SOLR
750.0000 mg | INTRAMUSCULAR | Status: DC
  Filled 2016-12-25: qty 750

## 2016-12-25 MED ORDER — SODIUM CHLORIDE 0.9 % IV SOLN
1.5000 mg/kg/h | INTRAVENOUS | Status: AC
  Administered 2016-12-26: 1.5 mg/kg/h via INTRAVENOUS
  Filled 2016-12-25: qty 25

## 2016-12-25 MED ORDER — MAGNESIUM SULFATE 50 % IJ SOLN
40.0000 meq | INTRAMUSCULAR | Status: DC
  Filled 2016-12-25: qty 10

## 2016-12-25 MED ORDER — SODIUM CHLORIDE 0.9 % IV SOLN
INTRAVENOUS | Status: DC
  Filled 2016-12-25: qty 30

## 2016-12-25 MED ORDER — TRANEXAMIC ACID (OHS) BOLUS VIA INFUSION
15.0000 mg/kg | INTRAVENOUS | Status: AC
  Administered 2016-12-26: 1263 mg via INTRAVENOUS
  Filled 2016-12-25: qty 1263

## 2016-12-25 MED ORDER — DEXTROSE 5 % IV SOLN
1.5000 g | INTRAVENOUS | Status: AC
  Administered 2016-12-26: 1.5 g via INTRAVENOUS
  Administered 2016-12-26: .75 g via INTRAVENOUS
  Filled 2016-12-25: qty 1.5

## 2016-12-25 MED ORDER — VANCOMYCIN HCL 10 G IV SOLR
1500.0000 mg | INTRAVENOUS | Status: AC
  Administered 2016-12-26: 1500 mg via INTRAVENOUS
  Filled 2016-12-25: qty 1500

## 2016-12-25 MED ORDER — DEXTROSE 5 % IV SOLN
0.0000 ug/min | INTRAVENOUS | Status: DC
  Filled 2016-12-25: qty 4

## 2016-12-25 MED ORDER — SODIUM CHLORIDE 0.9 % IV SOLN
30.0000 ug/min | INTRAVENOUS | Status: AC
  Administered 2016-12-26: 10 ug/min via INTRAVENOUS
  Filled 2016-12-25: qty 2

## 2016-12-25 MED ORDER — SODIUM CHLORIDE 0.9 % IV SOLN
INTRAVENOUS | Status: AC
  Administered 2016-12-26: 1 [IU]/h via INTRAVENOUS
  Filled 2016-12-25: qty 1

## 2016-12-25 MED ORDER — NITROGLYCERIN IN D5W 200-5 MCG/ML-% IV SOLN
2.0000 ug/min | INTRAVENOUS | Status: AC
  Administered 2016-12-26: 2 ug/min via INTRAVENOUS
  Filled 2016-12-25: qty 250

## 2016-12-25 NOTE — Progress Notes (Addendum)
Pre-op Cardiac Surgery  Carotid Findings:  Findings consistent with a 1-39 percent stenosis involving the right internal carotid artery and the left internal carotid artery. Bilateral vertebral arteries appear patent and antegrade.  Upper Extremity Right Left  Brachial Pressures 191, Tri 177, Tri  Radial Waveforms Tri Tri  Ulnar Waveforms Tri Tri  Palmar Arch (Allen's Test) waveform reverses with radial compression and increases less than 50% with ulnar compression waveform increases less than 50% with radial compression and decreases greater than 50% with ulnar compression (nearly obliterates)    Lower  Extremity Right Left  Dorsalis Pedis 193, Tri 195, Tri  Posterior Tibial 215, Tri 227, Tri  Ankle/Brachial Indices 1.1 1.2   Samuel Cook Samuel Cook- RDMS, RVT 10:53 AM  12/25/2016

## 2016-12-25 NOTE — Progress Notes (Addendum)
PCP is M. B. Dixon  LOV 12/2016 Cardio is Dr. Mendel RyderH Smith LOV 12/2016 Had pulmonary test 12/24/2016 Going to Doppler studies today 12/25/2016 @ 10am Echo 10/2016, Cath 11/2016   Denies any chest pains, sob.  Did say that they recently told him he had murmur, but denies any symptom Instructed him to stop smoking or chewing as of today.

## 2016-12-26 ENCOUNTER — Inpatient Hospital Stay (HOSPITAL_COMMUNITY): Payer: 59 | Admitting: Certified Registered Nurse Anesthetist

## 2016-12-26 ENCOUNTER — Inpatient Hospital Stay (HOSPITAL_COMMUNITY): Payer: 59

## 2016-12-26 ENCOUNTER — Inpatient Hospital Stay (HOSPITAL_COMMUNITY)
Admission: RE | Admit: 2016-12-26 | Discharge: 2016-12-31 | DRG: 236 | Disposition: A | Discharge status: Home / Self Care | Payer: 59 | Source: Ambulatory Visit | Attending: Cardiothoracic Surgery | Admitting: Cardiothoracic Surgery

## 2016-12-26 ENCOUNTER — Encounter (HOSPITAL_COMMUNITY)
Admission: RE | Disposition: A | Discharge status: Home / Self Care | Payer: Self-pay | Source: Ambulatory Visit | Attending: Cardiothoracic Surgery

## 2016-12-26 DIAGNOSIS — D72829 Elevated white blood cell count, unspecified: Secondary | ICD-10-CM | POA: Diagnosis not present

## 2016-12-26 DIAGNOSIS — I700 Atherosclerosis of aorta: Secondary | ICD-10-CM | POA: Diagnosis present

## 2016-12-26 DIAGNOSIS — I5030 Unspecified diastolic (congestive) heart failure: Secondary | ICD-10-CM | POA: Diagnosis not present

## 2016-12-26 DIAGNOSIS — Z951 Presence of aortocoronary bypass graft: Secondary | ICD-10-CM

## 2016-12-26 DIAGNOSIS — I2510 Atherosclerotic heart disease of native coronary artery without angina pectoris: Secondary | ICD-10-CM | POA: Diagnosis not present

## 2016-12-26 DIAGNOSIS — I110 Hypertensive heart disease with heart failure: Secondary | ICD-10-CM | POA: Diagnosis present

## 2016-12-26 DIAGNOSIS — R079 Chest pain, unspecified: Secondary | ICD-10-CM | POA: Diagnosis not present

## 2016-12-26 DIAGNOSIS — K219 Gastro-esophageal reflux disease without esophagitis: Secondary | ICD-10-CM | POA: Diagnosis present

## 2016-12-26 DIAGNOSIS — R918 Other nonspecific abnormal finding of lung field: Secondary | ICD-10-CM | POA: Diagnosis not present

## 2016-12-26 DIAGNOSIS — I350 Nonrheumatic aortic (valve) stenosis: Secondary | ICD-10-CM | POA: Diagnosis not present

## 2016-12-26 DIAGNOSIS — Z87891 Personal history of nicotine dependence: Secondary | ICD-10-CM | POA: Diagnosis not present

## 2016-12-26 DIAGNOSIS — E785 Hyperlipidemia, unspecified: Secondary | ICD-10-CM | POA: Diagnosis present

## 2016-12-26 DIAGNOSIS — Z79899 Other long term (current) drug therapy: Secondary | ICD-10-CM | POA: Diagnosis not present

## 2016-12-26 DIAGNOSIS — R9439 Abnormal result of other cardiovascular function study: Secondary | ICD-10-CM | POA: Diagnosis not present

## 2016-12-26 DIAGNOSIS — D62 Acute posthemorrhagic anemia: Secondary | ICD-10-CM | POA: Diagnosis not present

## 2016-12-26 DIAGNOSIS — Z4682 Encounter for fitting and adjustment of non-vascular catheter: Secondary | ICD-10-CM | POA: Diagnosis not present

## 2016-12-26 DIAGNOSIS — J9811 Atelectasis: Secondary | ICD-10-CM | POA: Diagnosis not present

## 2016-12-26 DIAGNOSIS — F419 Anxiety disorder, unspecified: Secondary | ICD-10-CM | POA: Diagnosis not present

## 2016-12-26 DIAGNOSIS — I083 Combined rheumatic disorders of mitral, aortic and tricuspid valves: Secondary | ICD-10-CM | POA: Diagnosis not present

## 2016-12-26 HISTORY — PX: CORONARY ARTERY BYPASS GRAFT: SHX141

## 2016-12-26 HISTORY — PX: TEE WITHOUT CARDIOVERSION: SHX5443

## 2016-12-26 LAB — POCT I-STAT 3, ART BLOOD GAS (G3+)
Acid-Base Excess: 6 mmol/L — ABNORMAL HIGH (ref 0.0–2.0)
Acid-Base Excess: 2 mmol/L (ref 0.0–2.0)
Acid-base deficit: 2 mmol/L (ref 0.0–2.0)
Acid-base deficit: 2 mmol/L (ref 0.0–2.0)
Bicarbonate: 31.7 mmol/L — ABNORMAL HIGH (ref 20.0–28.0)
Bicarbonate: 26.8 mmol/L (ref 20.0–28.0)
Bicarbonate: 26.6 mmol/L (ref 20.0–28.0)
Bicarbonate: 24.3 mmol/L (ref 20.0–28.0)
Bicarbonate: 23.3 mmol/L (ref 20.0–28.0)
O2 Saturation: 100 %
O2 Saturation: 100 %
O2 Saturation: 95 %
O2 Saturation: 95 %
O2 Saturation: 88 %
Patient temperature: 35.9
Patient temperature: 37.1
Patient temperature: 37.8
TCO2: 33 mmol/L (ref 0–100)
TCO2: 28 mmol/L (ref 0–100)
TCO2: 28 mmol/L (ref 0–100)
TCO2: 26 mmol/L (ref 0–100)
TCO2: 25 mmol/L (ref 0–100)
pCO2 arterial: 48.2 mmHg — ABNORMAL HIGH (ref 32.0–48.0)
pCO2 arterial: 43.3 mmHg (ref 32.0–48.0)
pCO2 arterial: 48.4 mmHg — ABNORMAL HIGH (ref 32.0–48.0)
pCO2 arterial: 47.3 mmHg (ref 32.0–48.0)
pCO2 arterial: 42.6 mmHg (ref 32.0–48.0)
pH, Arterial: 7.426 (ref 7.350–7.450)
pH, Arterial: 7.4 (ref 7.350–7.450)
pH, Arterial: 7.342 — ABNORMAL LOW (ref 7.350–7.450)
pH, Arterial: 7.319 — ABNORMAL LOW (ref 7.350–7.450)
pH, Arterial: 7.35 (ref 7.350–7.450)
pO2, Arterial: 505 mmHg — ABNORMAL HIGH (ref 83.0–108.0)
pO2, Arterial: 281 mmHg — ABNORMAL HIGH (ref 83.0–108.0)
pO2, Arterial: 80 mmHg — ABNORMAL LOW (ref 83.0–108.0)
pO2, Arterial: 82 mmHg — ABNORMAL LOW (ref 83.0–108.0)
pO2, Arterial: 60 mmHg — ABNORMAL LOW (ref 83.0–108.0)

## 2016-12-26 LAB — POCT I-STAT, CHEM 8
BUN: 19 mg/dL (ref 6–20)
BUN: 18 mg/dL (ref 6–20)
BUN: 15 mg/dL (ref 6–20)
BUN: 17 mg/dL (ref 6–20)
BUN: 17 mg/dL (ref 6–20)
BUN: 13 mg/dL (ref 6–20)
Calcium, Ion: 1.22 mmol/L (ref 1.15–1.40)
Calcium, Ion: 1.19 mmol/L (ref 1.15–1.40)
Calcium, Ion: 1.04 mmol/L — ABNORMAL LOW (ref 1.15–1.40)
Calcium, Ion: 1.11 mmol/L — ABNORMAL LOW (ref 1.15–1.40)
Calcium, Ion: 1.13 mmol/L — ABNORMAL LOW (ref 1.15–1.40)
Calcium, Ion: 1.2 mmol/L (ref 1.15–1.40)
Chloride: 103 mmol/L (ref 101–111)
Chloride: 105 mmol/L (ref 101–111)
Chloride: 101 mmol/L (ref 101–111)
Chloride: 103 mmol/L (ref 101–111)
Chloride: 103 mmol/L (ref 101–111)
Chloride: 104 mmol/L (ref 101–111)
Creatinine, Ser: 0.7 mg/dL (ref 0.61–1.24)
Creatinine, Ser: 0.5 mg/dL — ABNORMAL LOW (ref 0.61–1.24)
Creatinine, Ser: 0.5 mg/dL — ABNORMAL LOW (ref 0.61–1.24)
Creatinine, Ser: 0.6 mg/dL — ABNORMAL LOW (ref 0.61–1.24)
Creatinine, Ser: 0.7 mg/dL (ref 0.61–1.24)
Creatinine, Ser: 0.8 mg/dL (ref 0.61–1.24)
Glucose, Bld: 108 mg/dL — ABNORMAL HIGH (ref 65–99)
Glucose, Bld: 105 mg/dL — ABNORMAL HIGH (ref 65–99)
Glucose, Bld: 102 mg/dL — ABNORMAL HIGH (ref 65–99)
Glucose, Bld: 123 mg/dL — ABNORMAL HIGH (ref 65–99)
Glucose, Bld: 131 mg/dL — ABNORMAL HIGH (ref 65–99)
Glucose, Bld: 129 mg/dL — ABNORMAL HIGH (ref 65–99)
HCT: 36 % — ABNORMAL LOW (ref 39.0–52.0)
HCT: 35 % — ABNORMAL LOW (ref 39.0–52.0)
HCT: 30 % — ABNORMAL LOW (ref 39.0–52.0)
HCT: 31 % — ABNORMAL LOW (ref 39.0–52.0)
HCT: 32 % — ABNORMAL LOW (ref 39.0–52.0)
HCT: 36 % — ABNORMAL LOW (ref 39.0–52.0)
Hemoglobin: 12.2 g/dL — ABNORMAL LOW (ref 13.0–17.0)
Hemoglobin: 11.9 g/dL — ABNORMAL LOW (ref 13.0–17.0)
Hemoglobin: 10.2 g/dL — ABNORMAL LOW (ref 13.0–17.0)
Hemoglobin: 10.5 g/dL — ABNORMAL LOW (ref 13.0–17.0)
Hemoglobin: 10.9 g/dL — ABNORMAL LOW (ref 13.0–17.0)
Hemoglobin: 12.2 g/dL — ABNORMAL LOW (ref 13.0–17.0)
Potassium: 4 mmol/L (ref 3.5–5.1)
Potassium: 4.3 mmol/L (ref 3.5–5.1)
Potassium: 4.2 mmol/L (ref 3.5–5.1)
Potassium: 4.8 mmol/L (ref 3.5–5.1)
Potassium: 4 mmol/L (ref 3.5–5.1)
Potassium: 4.6 mmol/L (ref 3.5–5.1)
Sodium: 143 mmol/L (ref 135–145)
Sodium: 142 mmol/L (ref 135–145)
Sodium: 141 mmol/L (ref 135–145)
Sodium: 139 mmol/L (ref 135–145)
Sodium: 141 mmol/L (ref 135–145)
Sodium: 138 mmol/L (ref 135–145)
TCO2: 29 mmol/L (ref 0–100)
TCO2: 30 mmol/L (ref 0–100)
TCO2: 29 mmol/L (ref 0–100)
TCO2: 28 mmol/L (ref 0–100)
TCO2: 28 mmol/L (ref 0–100)
TCO2: 24 mmol/L (ref 0–100)

## 2016-12-26 LAB — CBC
HCT: 38.4 % — ABNORMAL LOW (ref 39.0–52.0)
HEMATOCRIT: 38.6 % — AB (ref 39.0–52.0)
Hemoglobin: 12.8 g/dL — ABNORMAL LOW (ref 13.0–17.0)
Hemoglobin: 13.2 g/dL (ref 13.0–17.0)
MCH: 32.3 pg (ref 26.0–34.0)
MCH: 33.6 pg (ref 26.0–34.0)
MCHC: 33.2 g/dL (ref 30.0–36.0)
MCHC: 34.4 g/dL (ref 30.0–36.0)
MCV: 97.5 fL (ref 78.0–100.0)
MCV: 97.7 fL (ref 78.0–100.0)
Platelets: 152 10*3/uL (ref 150–400)
Platelets: 169 10*3/uL (ref 150–400)
RBC: 3.96 MIL/uL — ABNORMAL LOW (ref 4.22–5.81)
RBC: 3.93 MIL/uL — ABNORMAL LOW (ref 4.22–5.81)
RDW: 11.9 % (ref 11.5–15.5)
RDW: 12.2 % (ref 11.5–15.5)
WBC: 14.1 10*3/uL — ABNORMAL HIGH (ref 4.0–10.5)
WBC: 16.3 10*3/uL — ABNORMAL HIGH (ref 4.0–10.5)

## 2016-12-26 LAB — GLUCOSE, CAPILLARY
Glucose-Capillary: 93 mg/dL (ref 65–99)
Glucose-Capillary: 90 mg/dL (ref 65–99)
Glucose-Capillary: 117 mg/dL — ABNORMAL HIGH (ref 65–99)
Glucose-Capillary: 132 mg/dL — ABNORMAL HIGH (ref 65–99)
Glucose-Capillary: 132 mg/dL — ABNORMAL HIGH (ref 65–99)

## 2016-12-26 LAB — CREATININE, SERUM
Creatinine, Ser: 0.84 mg/dL (ref 0.61–1.24)
GFR calc Af Amer: >60 mL/min (ref >60)
GFR calc non Af Amer: >60 mL/min (ref >60)

## 2016-12-26 LAB — MAGNESIUM: Magnesium: 2.7 mg/dL — ABNORMAL HIGH (ref 1.7–2.4)

## 2016-12-26 LAB — POCT I-STAT 4, (NA,K, GLUC, HGB,HCT)
Glucose, Bld: 102 mg/dL — ABNORMAL HIGH (ref 65–99)
HCT: 36 % — ABNORMAL LOW (ref 39.0–52.0)
Hemoglobin: 12.2 g/dL — ABNORMAL LOW (ref 13.0–17.0)
Potassium: 3.6 mmol/L (ref 3.5–5.1)
Sodium: 142 mmol/L (ref 135–145)

## 2016-12-26 LAB — HEMOGLOBIN A1C
Hgb A1c MFr Bld: 6.3 % — ABNORMAL HIGH (ref 4.8–5.6)
Mean Plasma Glucose: 134 mg/dL

## 2016-12-26 LAB — PLATELET COUNT: Platelets: 194 10*3/uL (ref 150–400)

## 2016-12-26 LAB — PROTIME-INR
INR: 1.35
Prothrombin Time: 16.8 seconds — ABNORMAL HIGH (ref 11.4–15.2)

## 2016-12-26 LAB — APTT: aPTT: 30 seconds (ref 24–36)

## 2016-12-26 LAB — HEMOGLOBIN AND HEMATOCRIT, BLOOD
HCT: 31.6 % — ABNORMAL LOW (ref 39.0–52.0)
Hemoglobin: 11.1 g/dL — ABNORMAL LOW (ref 13.0–17.0)

## 2016-12-26 SURGERY — CORONARY ARTERY BYPASS GRAFTING (CABG)
Anesthesia: General | Site: Chest

## 2016-12-26 MED ORDER — ACETAMINOPHEN 650 MG RE SUPP
650.0000 mg | Freq: Once | RECTAL | Status: AC
  Administered 2016-12-26: 650 mg via RECTAL

## 2016-12-26 MED ORDER — ORAL CARE MOUTH RINSE
15.0000 mL | Freq: Four times a day (QID) | OROMUCOSAL | Status: DC
  Administered 2016-12-26: 15 mL via OROMUCOSAL

## 2016-12-26 MED ORDER — DEXMEDETOMIDINE HCL IN NACL 400 MCG/100ML IV SOLN
0.4000 ug/kg/h | INTRAVENOUS | Status: DC
  Filled 2016-12-26: qty 100

## 2016-12-26 MED ORDER — SODIUM CHLORIDE 0.45 % IV SOLN
INTRAVENOUS | Status: DC | PRN
  Administered 2016-12-26: 14:00:00 via INTRAVENOUS

## 2016-12-26 MED ORDER — ROSUVASTATIN CALCIUM 10 MG PO TABS
20.0000 mg | ORAL_TABLET | Freq: Every day | ORAL | Status: DC
  Administered 2016-12-27 – 2016-12-31 (×5): 20 mg via ORAL
  Filled 2016-12-26 (×2): qty 1
  Filled 2016-12-26 (×3): qty 2

## 2016-12-26 MED ORDER — BISACODYL 10 MG RE SUPP: 10.0000 mg | Freq: Every day | RECTAL | Status: DC

## 2016-12-26 MED ORDER — 0.9 % SODIUM CHLORIDE (POUR BTL) OPTIME
TOPICAL | Status: DC | PRN
  Administered 2016-12-26: 6000 mL

## 2016-12-26 MED ORDER — METOPROLOL TARTRATE 12.5 MG HALF TABLET
12.5000 mg | ORAL_TABLET | Freq: Two times a day (BID) | ORAL | Status: DC
  Administered 2016-12-27 – 2016-12-28 (×3): 12.5 mg via ORAL
  Filled 2016-12-26 (×3): qty 1

## 2016-12-26 MED ORDER — NITROGLYCERIN IN D5W 200-5 MCG/ML-% IV SOLN
0.0000 ug/min | INTRAVENOUS | Status: DC
Start: 2016-12-26 — End: 2016-12-27

## 2016-12-26 MED ORDER — LACTATED RINGERS IV SOLN: 500.0000 mL | Freq: Once | INTRAVENOUS | Status: DC | PRN

## 2016-12-26 MED ORDER — HEPARIN SODIUM (PORCINE) 1000 UNIT/ML IJ SOLN
INTRAMUSCULAR | Status: DC | PRN
  Administered 2016-12-26: 35000 [IU] via INTRAVENOUS

## 2016-12-26 MED ORDER — SODIUM CHLORIDE 0.9 % IV SOLN
INTRAVENOUS | Status: DC | PRN
  Administered 2016-12-26: 08:00:00 via INTRAVENOUS

## 2016-12-26 MED ORDER — VANCOMYCIN HCL IN DEXTROSE 1-5 GM/200ML-% IV SOLN
1000.0000 mg | Freq: Once | INTRAVENOUS | Status: AC
  Administered 2016-12-26: 1000 mg via INTRAVENOUS
  Filled 2016-12-26: qty 200

## 2016-12-26 MED ORDER — ROCURONIUM BROMIDE 10 MG/ML (PF) SYRINGE
PREFILLED_SYRINGE | INTRAVENOUS | Status: DC | PRN
  Administered 2016-12-26: 100 mg via INTRAVENOUS
  Administered 2016-12-26: 25 mg via INTRAVENOUS
  Administered 2016-12-26 (×2): 50 mg via INTRAVENOUS
  Administered 2016-12-26: 40 mg via INTRAVENOUS

## 2016-12-26 MED ORDER — LACTATED RINGERS IV SOLN
INTRAVENOUS | Status: DC | PRN
  Administered 2016-12-26: 07:00:00 via INTRAVENOUS

## 2016-12-26 MED ORDER — PROTAMINE SULFATE 10 MG/ML IV SOLN
INTRAVENOUS | Status: AC
  Filled 2016-12-26: qty 25

## 2016-12-26 MED ORDER — MORPHINE SULFATE (PF) 4 MG/ML IV SOLN
2.0000 mg | INTRAVENOUS | Status: DC | PRN
  Administered 2016-12-26 – 2016-12-27 (×5): 4 mg via INTRAVENOUS
  Administered 2016-12-28: 2 mg via INTRAVENOUS
  Filled 2016-12-26 (×6): qty 1

## 2016-12-26 MED ORDER — EPHEDRINE 5 MG/ML INJ
INTRAVENOUS | Status: AC
  Filled 2016-12-26: qty 10

## 2016-12-26 MED ORDER — MIDAZOLAM HCL 2 MG/2ML IJ SOLN: 2.0000 mg | INTRAMUSCULAR | Status: DC | PRN

## 2016-12-26 MED ORDER — ACETAMINOPHEN 160 MG/5ML PO SOLN: 650.0000 mg | Freq: Once | ORAL | Status: AC

## 2016-12-26 MED ORDER — HEMOSTATIC AGENTS (NO CHARGE) OPTIME
TOPICAL | Status: DC | PRN
  Administered 2016-12-26: 1 via TOPICAL

## 2016-12-26 MED ORDER — ONDANSETRON HCL 4 MG/2ML IJ SOLN
4.0000 mg | Freq: Four times a day (QID) | INTRAMUSCULAR | Status: DC | PRN
Start: 2016-12-26 — End: 2016-12-31

## 2016-12-26 MED ORDER — OXYCODONE HCL 5 MG PO TABS
5.0000 mg | ORAL_TABLET | ORAL | Status: DC | PRN
  Administered 2016-12-27 – 2016-12-31 (×21): 10 mg via ORAL
  Filled 2016-12-26 (×21): qty 2
  Filled 2016-12-26: qty 1

## 2016-12-26 MED ORDER — METOPROLOL TARTRATE 5 MG/5ML IV SOLN: 2.5000 mg | INTRAVENOUS | Status: DC | PRN

## 2016-12-26 MED ORDER — SODIUM CHLORIDE 0.9% FLUSH
3.0000 mL | Freq: Two times a day (BID) | INTRAVENOUS | Status: DC
  Administered 2016-12-27 – 2016-12-31 (×8): 3 mL via INTRAVENOUS

## 2016-12-26 MED ORDER — PHENYLEPHRINE 40 MCG/ML (10ML) SYRINGE FOR IV PUSH (FOR BLOOD PRESSURE SUPPORT)
PREFILLED_SYRINGE | INTRAVENOUS | Status: AC
  Filled 2016-12-26: qty 10

## 2016-12-26 MED ORDER — LACTATED RINGERS IV SOLN: INTRAVENOUS | Status: DC

## 2016-12-26 MED ORDER — ARTIFICIAL TEARS OPHTHALMIC OINT
TOPICAL_OINTMENT | OPHTHALMIC | Status: DC | PRN
  Administered 2016-12-26: 1 via OPHTHALMIC

## 2016-12-26 MED ORDER — ACETAMINOPHEN 500 MG PO TABS
1000.0000 mg | ORAL_TABLET | Freq: Four times a day (QID) | ORAL | Status: DC
  Administered 2016-12-26 – 2016-12-31 (×18): 1000 mg via ORAL
  Filled 2016-12-26 (×18): qty 2

## 2016-12-26 MED ORDER — CHLORHEXIDINE GLUCONATE 4 % EX LIQD: 30.0000 mL | CUTANEOUS | Status: DC

## 2016-12-26 MED ORDER — PROTAMINE SULFATE 10 MG/ML IV SOLN
INTRAVENOUS | Status: AC
  Filled 2016-12-26: qty 5

## 2016-12-26 MED ORDER — DEXTROSE 5 % IV SOLN
1.5000 g | Freq: Two times a day (BID) | INTRAVENOUS | Status: AC
  Administered 2016-12-26 – 2016-12-28 (×4): 1.5 g via INTRAVENOUS
  Filled 2016-12-26 (×4): qty 1.5

## 2016-12-26 MED ORDER — ASPIRIN EC 325 MG PO TBEC
325.0000 mg | DELAYED_RELEASE_TABLET | Freq: Every day | ORAL | Status: DC
Start: 2016-12-27 — End: 2016-12-31
  Administered 2016-12-27 – 2016-12-31 (×5): 325 mg via ORAL
  Filled 2016-12-26 (×5): qty 1

## 2016-12-26 MED ORDER — SODIUM CHLORIDE 0.9 % IV SOLN
0.0000 ug/kg/h | INTRAVENOUS | Status: DC
  Administered 2016-12-26: 0.6 ug/kg/h via INTRAVENOUS
  Filled 2016-12-26: qty 2

## 2016-12-26 MED ORDER — FENTANYL CITRATE (PF) 250 MCG/5ML IJ SOLN
INTRAMUSCULAR | Status: DC | PRN
  Administered 2016-12-26: 250 ug via INTRAVENOUS
  Administered 2016-12-26: 50 ug via INTRAVENOUS
  Administered 2016-12-26: 150 ug via INTRAVENOUS
  Administered 2016-12-26: 200 ug via INTRAVENOUS
  Administered 2016-12-26: 100 ug via INTRAVENOUS
  Administered 2016-12-26: 150 ug via INTRAVENOUS
  Administered 2016-12-26: 100 ug via INTRAVENOUS
  Administered 2016-12-26: 50 ug via INTRAVENOUS
  Administered 2016-12-26 (×2): 100 ug via INTRAVENOUS

## 2016-12-26 MED ORDER — SODIUM CHLORIDE 0.9 % IV SOLN
0.0000 ug/min | INTRAVENOUS | Status: DC
  Filled 2016-12-26: qty 2

## 2016-12-26 MED ORDER — SODIUM CHLORIDE 0.9 % IV SOLN
INTRAVENOUS | Status: DC
  Filled 2016-12-26: qty 1

## 2016-12-26 MED ORDER — PROPOFOL 10 MG/ML IV BOLUS
INTRAVENOUS | Status: DC | PRN
  Administered 2016-12-26: 50 mg via INTRAVENOUS
  Administered 2016-12-26: 20 mg via INTRAVENOUS

## 2016-12-26 MED ORDER — ALBUMIN HUMAN 5 % IV SOLN
250.0000 mL | INTRAVENOUS | Status: AC | PRN
  Administered 2016-12-26 (×2): 250 mL via INTRAVENOUS

## 2016-12-26 MED ORDER — CHLORHEXIDINE GLUCONATE 0.12 % MT SOLN
15.0000 mL | OROMUCOSAL | Status: AC
Start: 2016-12-26 — End: 2016-12-26
  Administered 2016-12-26: 15 mL via OROMUCOSAL

## 2016-12-26 MED ORDER — HEPARIN SODIUM (PORCINE) 1000 UNIT/ML IJ SOLN
INTRAMUSCULAR | Status: AC
  Filled 2016-12-26: qty 1

## 2016-12-26 MED ORDER — SODIUM CHLORIDE 0.9 % IV SOLN
INTRAVENOUS | Status: DC
  Administered 2016-12-26: 14:00:00 via INTRAVENOUS

## 2016-12-26 MED ORDER — MORPHINE SULFATE (PF) 4 MG/ML IV SOLN: 2.0000 mg | INTRAVENOUS | Status: DC | PRN

## 2016-12-26 MED ORDER — POTASSIUM CHLORIDE 10 MEQ/50ML IV SOLN
10.0000 meq | INTRAVENOUS | Status: AC
  Administered 2016-12-26 (×3): 10 meq via INTRAVENOUS

## 2016-12-26 MED ORDER — BISACODYL 5 MG PO TBEC
10.0000 mg | DELAYED_RELEASE_TABLET | Freq: Every day | ORAL | Status: DC
  Administered 2016-12-27 – 2016-12-31 (×4): 10 mg via ORAL
  Filled 2016-12-26 (×4): qty 2

## 2016-12-26 MED ORDER — MAGNESIUM SULFATE 4 GM/100ML IV SOLN
4.0000 g | Freq: Once | INTRAVENOUS | Status: AC
  Administered 2016-12-26: 4 g via INTRAVENOUS
  Filled 2016-12-26: qty 100

## 2016-12-26 MED ORDER — ARTIFICIAL TEARS OPHTHALMIC OINT
TOPICAL_OINTMENT | OPHTHALMIC | Status: AC
  Filled 2016-12-26: qty 3.5

## 2016-12-26 MED ORDER — PANTOPRAZOLE SODIUM 40 MG PO TBEC: 40.0000 mg | DELAYED_RELEASE_TABLET | Freq: Every day | ORAL | Status: DC

## 2016-12-26 MED ORDER — FENTANYL CITRATE (PF) 250 MCG/5ML IJ SOLN
INTRAMUSCULAR | Status: AC
  Filled 2016-12-26: qty 5

## 2016-12-26 MED ORDER — ROCURONIUM BROMIDE 10 MG/ML (PF) SYRINGE
PREFILLED_SYRINGE | INTRAVENOUS | Status: AC
  Filled 2016-12-26: qty 10

## 2016-12-26 MED ORDER — SODIUM CHLORIDE 0.9 % IV SOLN
250.0000 mL | INTRAVENOUS | Status: DC
Start: 2016-12-27 — End: 2016-12-31

## 2016-12-26 MED ORDER — MORPHINE SULFATE (PF) 4 MG/ML IV SOLN
1.0000 mg | INTRAVENOUS | Status: AC | PRN
  Administered 2016-12-26 (×2): 4 mg via INTRAVENOUS
  Filled 2016-12-26 (×2): qty 1

## 2016-12-26 MED ORDER — MORPHINE SULFATE (PF) 2 MG/ML IV SOLN: 2.0000 mg | INTRAVENOUS | Status: DC | PRN

## 2016-12-26 MED ORDER — HEMOSTATIC AGENTS (NO CHARGE) OPTIME
TOPICAL | Status: DC | PRN
  Administered 2016-12-26: 1

## 2016-12-26 MED ORDER — INSULIN ASPART 100 UNIT/ML ~~LOC~~ SOLN
0.0000 [IU] | SUBCUTANEOUS | Status: DC
  Administered 2016-12-26 (×2): 2 [IU] via SUBCUTANEOUS
  Administered 2016-12-27: 12 [IU] via SUBCUTANEOUS
  Administered 2016-12-27 – 2016-12-28 (×4): 2 [IU] via SUBCUTANEOUS
  Administered 2016-12-28: 4 [IU] via SUBCUTANEOUS

## 2016-12-26 MED ORDER — METOPROLOL TARTRATE 25 MG/10 ML ORAL SUSPENSION: 12.5000 mg | Freq: Two times a day (BID) | ORAL | Status: DC

## 2016-12-26 MED ORDER — SODIUM CHLORIDE 0.9% FLUSH: 3.0000 mL | INTRAVENOUS | Status: DC | PRN

## 2016-12-26 MED ORDER — PROTAMINE SULFATE 10 MG/ML IV SOLN
INTRAVENOUS | Status: DC | PRN
  Administered 2016-12-26: 300 mg via INTRAVENOUS

## 2016-12-26 MED ORDER — CHLORHEXIDINE GLUCONATE 0.12% ORAL RINSE (MEDLINE KIT)
15.0000 mL | Freq: Two times a day (BID) | OROMUCOSAL | Status: DC
  Administered 2016-12-26: 15 mL via OROMUCOSAL

## 2016-12-26 MED ORDER — INSULIN REGULAR BOLUS VIA INFUSION
0.0000 [IU] | Freq: Three times a day (TID) | INTRAVENOUS | Status: DC
  Filled 2016-12-26: qty 10

## 2016-12-26 MED ORDER — SODIUM CHLORIDE 0.9 % IJ SOLN
OROMUCOSAL | Status: DC | PRN
  Administered 2016-12-26 (×3): 4 mL via TOPICAL

## 2016-12-26 MED ORDER — PROPOFOL 10 MG/ML IV BOLUS
INTRAVENOUS | Status: AC
  Filled 2016-12-26: qty 20

## 2016-12-26 MED ORDER — MORPHINE SULFATE (PF) 2 MG/ML IV SOLN: 1.0000 mg | INTRAVENOUS | Status: DC | PRN

## 2016-12-26 MED ORDER — MIDAZOLAM HCL 5 MG/5ML IJ SOLN
INTRAMUSCULAR | Status: DC | PRN
  Administered 2016-12-26 (×2): 3 mg via INTRAVENOUS
  Administered 2016-12-26 (×2): 2 mg via INTRAVENOUS

## 2016-12-26 MED ORDER — FENTANYL CITRATE (PF) 250 MCG/5ML IJ SOLN
INTRAMUSCULAR | Status: AC
  Filled 2016-12-26: qty 20

## 2016-12-26 MED ORDER — ASPIRIN 81 MG PO CHEW: 324.0000 mg | CHEWABLE_TABLET | Freq: Every day | ORAL | Status: DC

## 2016-12-26 MED ORDER — FAMOTIDINE IN NACL 20-0.9 MG/50ML-% IV SOLN
20.0000 mg | Freq: Two times a day (BID) | INTRAVENOUS | Status: DC
  Administered 2016-12-26: 20 mg via INTRAVENOUS

## 2016-12-26 MED ORDER — MIDAZOLAM HCL 10 MG/2ML IJ SOLN
INTRAMUSCULAR | Status: AC
  Filled 2016-12-26: qty 2

## 2016-12-26 MED ORDER — TRAMADOL HCL 50 MG PO TABS: 50.0000 mg | ORAL_TABLET | ORAL | Status: DC | PRN

## 2016-12-26 MED ORDER — DOCUSATE SODIUM 100 MG PO CAPS
200.0000 mg | ORAL_CAPSULE | Freq: Every day | ORAL | Status: DC
  Administered 2016-12-27 – 2016-12-31 (×4): 200 mg via ORAL
  Filled 2016-12-26 (×4): qty 2

## 2016-12-26 MED ORDER — ACETAMINOPHEN 160 MG/5ML PO SOLN: 1000.0000 mg | Freq: Four times a day (QID) | ORAL | Status: DC

## 2016-12-26 MED FILL — Lidocaine HCl IV Inj 20 MG/ML: INTRAVENOUS | Qty: 5 | Status: AC

## 2016-12-26 MED FILL — Mannitol IV Soln 20%: INTRAVENOUS | Qty: 500 | Status: AC

## 2016-12-26 MED FILL — Electrolyte-R (PH 7.4) Solution: INTRAVENOUS | Qty: 3000 | Status: AC

## 2016-12-26 MED FILL — Sodium Chloride IV Soln 0.9%: INTRAVENOUS | Qty: 2000 | Status: AC

## 2016-12-26 MED FILL — Heparin Sodium (Porcine) Inj 1000 Unit/ML: INTRAMUSCULAR | Qty: 10 | Status: AC

## 2016-12-26 MED FILL — Sodium Bicarbonate IV Soln 8.4%: INTRAVENOUS | Qty: 50 | Status: AC

## 2016-12-26 SURGICAL SUPPLY — 97 items
ADAPTER CARDIO PERF ANTE/RETRO (ADAPTER) ×3 IMPLANT
ADH SKN CLS APL DERMABOND .7 (GAUZE/BANDAGES/DRESSINGS) ×2
ADPR PRFSN 84XANTGRD RTRGD (ADAPTER) ×2
AGENT HMST KT MTR STRL THRMB (HEMOSTASIS) ×2
APL SKNCLS STERI-STRIP NONHPOA (GAUZE/BANDAGES/DRESSINGS) ×2
APPLICATOR COTTON TIP 6IN STRL (MISCELLANEOUS) IMPLANT
BAG DECANTER FOR FLEXI CONT (MISCELLANEOUS) ×3 IMPLANT
BANDAGE ACE 4X5 VEL STRL LF (GAUZE/BANDAGES/DRESSINGS) ×2 IMPLANT
BANDAGE ACE 6X5 VEL STRL LF (GAUZE/BANDAGES/DRESSINGS) ×2 IMPLANT
BANDAGE ELASTIC 4 VELCRO ST LF (GAUZE/BANDAGES/DRESSINGS) ×1 IMPLANT
BANDAGE ELASTIC 6 VELCRO ST LF (GAUZE/BANDAGES/DRESSINGS) ×1 IMPLANT
BENZOIN TINCTURE PRP APPL 2/3 (GAUZE/BANDAGES/DRESSINGS) ×1 IMPLANT
BLADE STERNUM SYSTEM 6 (BLADE) ×3 IMPLANT
BLADE SURG 11 STRL SS (BLADE) ×1 IMPLANT
BLADE SURG 15 STRL LF DISP TIS (BLADE) ×2 IMPLANT
BLADE SURG 15 STRL SS (BLADE) ×3
BNDG GAUZE ELAST 4 BULKY (GAUZE/BANDAGES/DRESSINGS) ×3 IMPLANT
BOOT SUTURE AID YELLOW STND (SUTURE) IMPLANT
CANISTER SUCT 3000ML PPV (MISCELLANEOUS) ×3 IMPLANT
CANNULA GUNDRY RCSP 15FR (MISCELLANEOUS) ×2 IMPLANT
CATH CPB KIT GERHARDT (MISCELLANEOUS) ×3 IMPLANT
CATH HEART VENT LEFT (CATHETERS) ×2 IMPLANT
CATH THORACIC 28FR (CATHETERS) ×3 IMPLANT
CATH/SQUID NICHOLS JEHLE COR (CATHETERS) IMPLANT
CRADLE DONUT ADULT HEAD (MISCELLANEOUS) ×3 IMPLANT
DERMABOND ADVANCED (GAUZE/BANDAGES/DRESSINGS) ×1
DERMABOND ADVANCED .7 DNX12 (GAUZE/BANDAGES/DRESSINGS) IMPLANT
DRAIN CHANNEL 28F RND 3/8 FF (WOUND CARE) ×3 IMPLANT
DRAPE CARDIOVASCULAR INCISE (DRAPES) ×3
DRAPE SLUSH/WARMER DISC (DRAPES) ×3 IMPLANT
DRAPE SRG 135X102X78XABS (DRAPES) ×2 IMPLANT
DRSG AQUACEL AG ADV 3.5X14 (GAUZE/BANDAGES/DRESSINGS) ×3 IMPLANT
ELECT BLADE 4.0 EZ CLEAN MEGAD (MISCELLANEOUS) ×3
ELECT CAUTERY BLADE 6.4 (BLADE) ×3 IMPLANT
ELECT REM PT RETURN 9FT ADLT (ELECTROSURGICAL) ×6
ELECTRODE BLDE 4.0 EZ CLN MEGD (MISCELLANEOUS) ×2 IMPLANT
ELECTRODE REM PT RTRN 9FT ADLT (ELECTROSURGICAL) ×4 IMPLANT
FELT TEFLON 1X6 (MISCELLANEOUS) ×5 IMPLANT
GAUZE SPONGE 4X4 12PLY STRL (GAUZE/BANDAGES/DRESSINGS) ×4 IMPLANT
GAUZE SPONGE 4X4 12PLY STRL LF (GAUZE/BANDAGES/DRESSINGS) ×2 IMPLANT
GLOVE BIO SURGEON STRL SZ 6.5 (GLOVE) ×13 IMPLANT
GLOVE BIOGEL M 7.0 STRL (GLOVE) ×7 IMPLANT
GLOVE SURG SS PI 6.0 STRL IVOR (GLOVE) ×2 IMPLANT
GOWN STRL REUS W/ TWL LRG LVL3 (GOWN DISPOSABLE) ×8 IMPLANT
GOWN STRL REUS W/TWL LRG LVL3 (GOWN DISPOSABLE) ×30
HEMOSTAT POWDER SURGIFOAM 1G (HEMOSTASIS) ×9 IMPLANT
HEMOSTAT SURGICEL 2X14 (HEMOSTASIS) ×3 IMPLANT
INSERT FOGARTY XLG (MISCELLANEOUS) IMPLANT
KIT BASIN OR (CUSTOM PROCEDURE TRAY) ×3 IMPLANT
KIT CATH SUCT 8FR (CATHETERS) ×3 IMPLANT
KIT ROOM TURNOVER OR (KITS) ×3 IMPLANT
KIT SUCTION CATH 14FR (SUCTIONS) ×6 IMPLANT
KIT VASOVIEW HEMOPRO VH 3000 (KITS) ×3 IMPLANT
LEAD PACING MYOCARDI (MISCELLANEOUS) ×3 IMPLANT
LINE VENT (MISCELLANEOUS) ×1 IMPLANT
MARKER GRAFT CORONARY BYPASS (MISCELLANEOUS) ×9 IMPLANT
NS IRRIG 1000ML POUR BTL (IV SOLUTION) ×16 IMPLANT
PACK OPEN HEART (CUSTOM PROCEDURE TRAY) ×3 IMPLANT
PAD ARMBOARD 7.5X6 YLW CONV (MISCELLANEOUS) ×6 IMPLANT
PAD ELECT DEFIB RADIOL ZOLL (MISCELLANEOUS) ×3 IMPLANT
PENCIL BUTTON HOLSTER BLD 10FT (ELECTRODE) ×3 IMPLANT
PUNCH AORTIC ROT 4.0MM RCL 40 (MISCELLANEOUS) ×1 IMPLANT
SET CARDIOPLEGIA MPS 5001102 (MISCELLANEOUS) ×1 IMPLANT
SOLUTION ANTI FOG 6CC (MISCELLANEOUS) ×1 IMPLANT
SPONGE LAP 18X18 X RAY DECT (DISPOSABLE) ×3 IMPLANT
SURGIFLO W/THROMBIN 8M KIT (HEMOSTASIS) ×1 IMPLANT
SUT BONE WAX W31G (SUTURE) ×3 IMPLANT
SUT ETHIBON 2 0 V 52N 30 (SUTURE) ×6 IMPLANT
SUT MNCRL AB 4-0 PS2 18 (SUTURE) ×1 IMPLANT
SUT PROLENE 3 0 RB 1 (SUTURE) ×3 IMPLANT
SUT PROLENE 3 0 SH1 36 (SUTURE) ×3 IMPLANT
SUT PROLENE 4 0 RB 1 (SUTURE)
SUT PROLENE 4 0 TF (SUTURE) ×6 IMPLANT
SUT PROLENE 4-0 RB1 .5 CRCL 36 (SUTURE) IMPLANT
SUT PROLENE 6 0 CC (SUTURE) ×11 IMPLANT
SUT PROLENE 7 0 BV 1 (SUTURE) ×1 IMPLANT
SUT PROLENE 7 0 BV1 MDA (SUTURE) ×3 IMPLANT
SUT PROLENE 7.0 RB 3 (SUTURE) ×1 IMPLANT
SUT PROLENE 8 0 BV175 6 (SUTURE) ×1 IMPLANT
SUT PROLENE BLUE 7 0 (SUTURE) ×1 IMPLANT
SUT STEEL 6MS V (SUTURE) ×3 IMPLANT
SUT STEEL SZ 6 DBL 3X14 BALL (SUTURE) ×3 IMPLANT
SUT VIC AB 1 CTX 18 (SUTURE) ×6 IMPLANT
SUT VIC AB 2-0 CT1 27 (SUTURE) ×3
SUT VIC AB 2-0 CT1 TAPERPNT 27 (SUTURE) IMPLANT
SUTURE E-PAK OPEN HEART (SUTURE) ×3 IMPLANT
SYSTEM SAHARA CHEST DRAIN ATS (WOUND CARE) ×3 IMPLANT
TAPE CLOTH SURG 4X10 WHT LF (GAUZE/BANDAGES/DRESSINGS) ×2 IMPLANT
TOWEL GREEN STERILE (TOWEL DISPOSABLE) ×9 IMPLANT
TOWEL GREEN STERILE FF (TOWEL DISPOSABLE) ×4 IMPLANT
TOWEL OR 17X24 6PK STRL BLUE (TOWEL DISPOSABLE) ×5 IMPLANT
TOWEL OR 17X26 10 PK STRL BLUE (TOWEL DISPOSABLE) ×4 IMPLANT
TRAY FOLEY SILVER 16FR TEMP (SET/KITS/TRAYS/PACK) ×3 IMPLANT
TUBING INSUFFLATION (TUBING) ×3 IMPLANT
UNDERPAD 30X30 (UNDERPADS AND DIAPERS) ×3 IMPLANT
VENT LEFT HEART 12002 (CATHETERS) ×3
WATER STERILE IRR 1000ML POUR (IV SOLUTION) ×6 IMPLANT

## 2016-12-26 NOTE — Transfer of Care (Signed)
Immediate Anesthesia Transfer of Care Note  Patient: Samuel Cook  Procedure(s) Performed: Procedure(s): CORONARY ARTERY BYPASS GRAFTING (CABG) x 3 WITH ENDOSCOPIC HARVESTING OF RIGHT SAPHENOUS VEIN (N/A) TRANSESOPHAGEAL ECHOCARDIOGRAM (TEE) (N/A)  Patient Location: SICU  Anesthesia Type:General  Level of Consciousness: Patient remains intubated per anesthesia plan  Airway & Oxygen Therapy: Patient remains intubated per anesthesia plan and Patient placed on Ventilator (see vital sign flow sheet for setting)  Post-op Assessment: Report given to RN and Post -op Vital signs reviewed and stable  Post vital signs: Reviewed and stable  Last Vitals:  Vitals:   12/26/16 0558 12/26/16 1350  BP: (!) 174/75   Pulse: (!) 57 80  Resp: 18 12  Temp: 37.2 C 36.3 C    Last Pain: There were no vitals filed for this visit.    Patients Stated Pain Goal: 5 (12/26/16 [id number])  Complications: No apparent anesthesia complications

## 2016-12-26 NOTE — Progress Notes (Signed)
[facility] BRIEF SICU PROGRESS NOTE  Day of Surgery  S/P Procedure(s) (LRB): CORONARY ARTERY BYPASS GRAFTING (CABG) x 3 WITH ENDOSCOPIC HARVESTING OF RIGHT SAPHENOUS VEIN (N/A) TRANSESOPHAGEAL ECHOCARDIOGRAM (TEE) (N/A)   Sedated on vent AAI paced w/ stable hemodynamics off drips O2 sats 100% Chest tube output low UOP > 100 mL/hr Labs okay  Plan: Continue routine early postop  Purcell Nailslarence H Owen, MD 12/26/2016 4:23 PM

## 2016-12-26 NOTE — Anesthesia Procedure Notes (Signed)
Central Venous Catheter Insertion Performed by: Val EagleMOSER, Oliver Heitzenrater, anesthesiologist Start/End5/06/2017 7:11 AM, 12/26/2016 7:23 AM Patient location: Pre-op. Preanesthetic checklist: patient identified, IV checked, site marked, risks and benefits discussed, surgical consent, monitors and equipment checked, pre-op evaluation, timeout performed and anesthesia consent Hand hygiene performed  and maximum sterile barriers used  PA cath was placed.Swan type:thermodilution Procedure performed without using ultrasound guided technique. Attempts: 1 Patient tolerated the procedure well with no immediate complications.

## 2016-12-26 NOTE — Progress Notes (Signed)
Per order o fDr. Tyrone SageGerhardt- add possible Aortic Valve Replacement

## 2016-12-26 NOTE — Procedures (Signed)
Extubation Procedure Note  Patient Details:   Name: Samuel Cook DOB: 22-Dec-1955 MRN: [id number]   Airway Documentation:     Evaluation  O2 sats: stable throughout Complications: No apparent complications Patient did tolerate procedure well. Bilateral Breath Sounds: Clear   Yes   Patient extubated to 2L nasal cannula.  Positive cuff leak noted.  No evidence of stridor.  Patient able to speak post extubation.  Sats and vitals are stable.  NIF of -28, VC of 900L performed.  No complications noted.   Samuel Cook, Samuel Cook 12/26/2016, 6:22 PM

## 2016-12-26 NOTE — Anesthesia Procedure Notes (Signed)
Central Venous Catheter Insertion Performed by: Val EagleMOSER, Slyvester Latona, anesthesiologist Start/End5/06/2017 7:11 AM, 12/26/2016 7:23 AM Patient location: Pre-op. Preanesthetic checklist: patient identified, IV checked, site marked, risks and benefits discussed, surgical consent, monitors and equipment checked, pre-op evaluation, timeout performed and anesthesia consent Lidocaine 1% used for infiltration and patient sedated Hand hygiene performed  and maximum sterile barriers used  Catheter size: 8.5 Fr Total catheter length 10. Sheath introducer Procedure performed using ultrasound guided technique. Ultrasound Notes:anatomy identified, needle tip was noted to be adjacent to the nerve/plexus identified, no ultrasound evidence of intravascular and/or intraneural injection and image(s) printed for medical record Attempts: 1 Following insertion, line sutured and dressing applied. Post procedure assessment: blood return through all ports, free fluid flow and no air  Patient tolerated the procedure well with no immediate complications.

## 2016-12-26 NOTE — H&P (Signed)
[address]       [place], [id number]             [phone]                    Samuel Cook [place] Medical Record #[id number] Date of Birth: 01/19/1956  Referring: Dr Mendel RyderH Smith Primary Care: Dorena Bodoixon, [name], PA-C  Chief Complaint:    Heart murmur    History of Present Illness:    Samuel Cook 61 y.o. male is seen in the office  today for Consideration of coronary artery bypass grafting because of positive stress test and cardiac cath evidence of left main obstruction earlier this week the patient discontinued his fish oil patient also has noted an abnormal aortic valve. The patient denies any symptoms, he denies shortness of breath, denies angina, denies syncope, near syncope denies signs or symptoms of congestive heart failure. Last week he underwent cardiac catheterization after he had a positive stress test. Since last week the patient has been seen by his dentist had Panorex and thorough cleaning, is not thought to have any active infection but does need crowns placed.      Current Activity/ Functional Status:  Patient is independent with mobility/ambulation, transfers, ADL's, IADL's.   Zubrod Score: At the time of surgery this patient's most appropriate activity status/level should be described as: [x]     0    Normal activity, no symptoms []     1    Restricted in physical strenuous activity but ambulatory, able to do out light work []     2    Ambulatory and capable of self care, unable to do work activities, up and about               >50 % of waking hours                              []     3    Only limited self care, in bed greater than 50% of waking hours []     4    Completely disabled, no self care, confined to bed or chair []     5    Moribund   Past Medical History:  Diagnosis Date  . Anxiety   . Coronary artery disease   . GERD (gastroesophageal reflux disease)    occasionally  . Heart murmur    dx 1.5 mths ago.  No symptoms  .  Hyperlipidemia   . Hypertension     Past Surgical History:  Procedure Laterality Date  . CARDIAC CATHETERIZATION    . LEFT HEART CATH AND CORONARY ANGIOGRAPHY N/A 12/12/2016   Procedure: Left Heart Cath and Coronary Angiography;  Surgeon: Lyn RecordsHenry W Smith, MD;  [facility];  Service: Cardiovascular;  Laterality: N/A;  . none     none    Family History  Problem Relation Age of Onset  . Cancer Mother   . Early death Mother   . Stroke Father   . Birth defects Maternal Aunt   . Hyperlipidemia Maternal Aunt   . Hearing loss Maternal Uncle   . Heart disease Maternal Grandmother     Social History   Social History  . Marital status: Married    Spouse name: N/A  . Number of children: N/A  . Years of education: N/A   Occupational History  . Works as Psychologist, occupationalwelder  Social History Main Topics  . Smoking status: Former Smoker    Quit date: 10/20/1993  . Smokeless tobacco: Former Neurosurgeon    Types: Snuff, Chew  . Alcohol use Yes     Comment: occassional  . Drug use: Yes    Types: Marijuana  . Sexual activity: Yes   Other Topics Concern  . Not on file   Social History Narrative  . No narrative on file    History  Smoking Status  . Former Smoker  . Packs/day: 1.00  . Years: 20.00  . Quit date: 10/20/1993  Smokeless Tobacco  . Current User  . Types: Snuff, Chew    History  Alcohol Use  . Yes    Comment: rare      Allergies  Allergen Reactions  . No Known Allergies     Current Facility-Administered Medications  Medication Dose Route Frequency Provider Last Rate Last Dose  . cefUROXime (ZINACEF) 1.5 g in dextrose 5 % 50 mL IVPB  1.5 g Intravenous To OR Delight Ovens, MD      . cefUROXime (ZINACEF) 750 mg in dextrose 5 % 50 mL IVPB  750 mg Intravenous To OR Delight Ovens, MD      . chlorhexidine (HIBICLENS) 4 % liquid 2 application  30 mL Topical UD Delight Ovens, MD      . dexmedetomidine (PRECEDEX) 400 MCG/100ML (4 mcg/mL) infusion   0.1-0.7 mcg/kg/hr Intravenous To OR Delight Ovens, MD      . DOPamine (INTROPIN) 800 mg in dextrose 5 % 250 mL (3.2 mg/mL) infusion  0-10 mcg/kg/min Intravenous To OR Delight Ovens, MD      . EPINEPHrine (ADRENALIN) 4 mg in dextrose 5 % 250 mL (0.016 mg/mL) infusion  0-10 mcg/min Intravenous To OR Delight Ovens, MD      . heparin 2,500 Units, papaverine 30 mg in electrolyte-148 (PLASMALYTE-148) 500 mL irrigation   Irrigation To OR Delight Ovens, MD      . heparin 30,000 units/NS 1000 mL solution for CELLSAVER   Other To OR Delight Ovens, MD      . insulin regular (NOVOLIN R,HUMULIN R) 100 Units in sodium chloride 0.9 % 100 mL (1 Units/mL) infusion   Intravenous To OR Delight Ovens, MD      . magnesium sulfate (IV Push/IM) injection 40 mEq  40 mEq Other To OR Delight Ovens, MD      . metoprolol tartrate (LOPRESSOR) tablet 12.5 mg  12.5 mg Oral Once Delight Ovens, MD      . nitroGLYCERIN 50 mg in dextrose 5 % 250 mL (0.2 mg/mL) infusion  2-200 mcg/min Intravenous To OR Delight Ovens, MD      . phenylephrine (NEO-SYNEPHRINE) 20 mg in sodium chloride 0.9 % 250 mL (0.08 mg/mL) infusion  30-200 mcg/min Intravenous To OR Delight Ovens, MD      . potassium chloride injection 80 mEq  80 mEq Other To OR Delight Ovens, MD      . tranexamic acid (CYKLOKAPRON) 2,500 mg in sodium chloride 0.9 % 250 mL (10 mg/mL) infusion  1.5 mg/kg/hr Intravenous To OR Delight Ovens, MD      . tranexamic acid (CYKLOKAPRON) bolus via infusion - over 30 minutes 1,263 mg  15 mg/kg Intravenous To OR Delight Ovens, MD      . tranexamic acid (CYKLOKAPRON) pump prime solution 168 mg  2 mg/kg Intracatheter To OR Delight Ovens, MD      .  vancomycin (VANCOCIN) 1,500 mg in sodium chloride 0.9 % 250 mL IVPB  1,500 mg Intravenous To OR Delight Ovens, MD       Earlier this week the patient discontinued his fish oil.   Review of Systems:     Cardiac Review of  Systems: Y or N  Chest Pain [ n   ]  Resting SOB [  n ] Exertional SOB  [ n ]  Orthopnea [n  ]   Pedal Edema [ n  ]    Palpitations [ n ] Syncope  [n  ]   Presyncope [ n  ]  General Review of Systems: [Y] = yes [  ]=no Constitional: recent weight change [ n ];  Wt loss over the last 3 months [   ] anorexia [  ]; fatigue [  ]; nausea [  ]; night sweats [  ]; fever [  ]; or chills [  ];          Dental: poor dentition[ y ]; Last Dentist visit: 5 yea4rs ago  Eye : blurred vision [  ]; diplopia [   ]; vision changes [  ];  Amaurosis fugax[  ]; Resp: cough [  ];  wheezing[  ];  hemoptysis[  ]; shortness of breath[  ]; paroxysmal nocturnal dyspnea[  ]; dyspnea on exertion[  ]; or orthopnea[  ];  GI:  gallstones[  ], vomiting[  ];  dysphagia[  ]; melena[  ];  hematochezia [  ]; heartburn[  ];   Hx of  Colonoscopy[n  ]; GU: kidney stones [  ]; hematuria[  ];   dysuria [  ];  nocturia[  ];  history of     obstruction [  ]; urinary frequency [  ]             Skin: rash, swelling[  ];, hair loss[  ];  peripheral edema[  ];  or itching[  ]; Musculosketetal: myalgias[  ];  joint swelling[  ];  joint erythema[  ];  joint pain[  ];  back pain[  ];  Heme/Lymph: bruising[  ];  bleeding[  ];  anemia[  ];  Neuro: TIA[  ];  headaches[  ];  stroke[  ];  vertigo[  ];  seizures[  ];   paresthesias[  ];  difficulty walking[  ];  Psych:depression[  ]; anxiety[  ];  Endocrine: diabetes[ n ];  thyroid dysfunction[ n ];  Immunizations: Flu up to date [  ]; Pneumococcal up to date [  ];  Other:  Physical Exam: BP (!) 174/75   Pulse (!) 57   Temp 98.9 F (37.2 C)   Resp 18   SpO2 99%   PHYSICAL EXAMINATION: General appearance: alert, cooperative and no distress Head: Normocephalic, without obvious abnormality, atraumatic Neck: no adenopathy, no carotid bruit, no JVD, supple, symmetrical, trachea midline and thyroid not enlarged, symmetric, no tenderness/mass/nodules Lymph nodes: Cervical, supraclavicular, and  axillary nodes normal. Resp: clear to auscultation bilaterally Back: symmetric, no curvature. ROM normal. No CVA tenderness. Cardio: systolic murmur: holosystolic 2/6, crescendo at lower left sternal border GI: soft, non-tender; bowel sounds normal; no masses,  no organomegaly Extremities: extremities normal, atraumatic, no cyanosis or edema and Homans sign is negative, no sign of DVT Neurologic: Grossly normal Patient has poor dentition with multiple upper broken off teeth  Diagnostic Studies & Laboratory data:     Recent Radiology Findings:   Dg Chest 2 View  Result Date: 12/25/2016 CLINICAL  DATA:  Coronary artery disease. Hypertension. Aortic stenosis. Pre-op respiratory exam EXAM: CHEST  2 VIEW COMPARISON:  12/08/2016 FINDINGS: The heart size and mediastinal contours are within normal limits. No evidence of pulmonary infiltrate or edema. No evidence of pleural effusion. A small nodular density is seen overlying the left lower lung which was not seen on previous study. This may represent a nipple shadow or pulmonary nodule. IMPRESSION: Small nodular density overlying left lower lung, which may represent a nipple shadow or pulmonary nodule. Chest radiograph with nipple markers is recommended for further evaluation. These results will be called to the ordering clinician or representative by the Radiologist Assistant, and communication documented in the PACS or zVision Dashboard. Electronically Signed   By: Myles Rosenthal M.D.   On: 12/25/2016 09:16   Dg Chest 2 View  Result Date: 12/09/2016 CLINICAL DATA:  Preop evaluation for upcoming cardiac catheterization EXAM: CHEST  2 VIEW COMPARISON:  07/15/2004 FINDINGS: The heart size and mediastinal contours are within normal limits. Both lungs are clear. The visualized skeletal structures are unremarkable. Bilateral nipple shadows are noted. IMPRESSION: No active cardiopulmonary disease. Electronically Signed   By: Alcide Clever M.D.   On: 12/09/2016 08:08     Ct Chest Wo Contrast  Result Date: 12/25/2016 CLINICAL DATA:  Left lung nodule. EXAM: CT CHEST WITHOUT CONTRAST TECHNIQUE: Multidetector CT imaging of the chest was performed following the standard protocol without IV contrast. COMPARISON:  Chest x-ray 12/25/2016. FINDINGS: Cardiovascular: The heart size is normal. No pericardial effusion. Coronary artery calcification is noted. Atherosclerotic calcification is noted in the wall of the thoracic aorta. Mediastinum/Nodes: No mediastinal lymphadenopathy. No evidence for gross hilar lymphadenopathy although assessment is limited by the lack of intravenous contrast on today's study. The esophagus has normal imaging features. There is no axillary lymphadenopathy. Lungs/Pleura: Linear scarring noted right apex. Subsegmental atelectasis noted posterior right lower lobe and lingula. No suspicious pulmonary nodule or mass. Nodular opacity seen on chest x-ray earlier today represents left nipple shadow. No pulmonary edema or pleural effusion. Upper Abdomen: Unremarkable. Musculoskeletal: Bone windows reveal no worrisome lytic or sclerotic osseous lesions. IMPRESSION: 1. No pulmonary nodule or mass. Nodular opacity seen on x-ray earlier today represents left nipple shadow. 2. Coronary artery and thoracic aortic atherosclerosis. Electronically Signed   By: Kennith Center M.D.   On: 12/25/2016 12:36   Nm Myocar Multi W/spect W/wall Motion / Ef  Result Date: 12/03/2016  Findings consistent with prior large anteroseptal,septal,inferoseptal infarct with moderate peri-infarct ischemia. There is a moderate sized apical infarct with moderate peri-infarct ischemia.  This is a high risk study.  The left ventricular ejection fraction is normal (55-65%).      I have independently reviewed the above radiologic studies.  Recent Lab Findings: Lab Results  Component Value Date   WBC 11.7 (H) 12/25/2016   HGB 16.2 12/25/2016   HCT 47.1 12/25/2016   PLT 219 12/25/2016    GLUCOSE 112 (H) 12/25/2016   CHOL 135 11/28/2016   TRIG 61 11/28/2016   HDL 50 11/28/2016   LDLCALC 73 11/28/2016   ALT 43 12/25/2016   AST 30 12/25/2016   NA 135 12/25/2016   K 4.2 12/25/2016   CL 103 12/25/2016   CREATININE 0.82 12/25/2016   BUN 15 12/25/2016   CO2 22 12/25/2016   TSH 0.46 11/28/2016   INR 0.97 12/25/2016   HGBA1C 6.3 (H) 12/25/2016   Procedures   Left Heart Cath and Coronary Angiography  Conclusion    Heavy coronary  calcification throughout the left main and proximal to mid LAD.  Tubular 55-65% distal left main  Ostial 75% LAD  Ostial 90% first obtuse marginal  Nondominant right coronary with ostial 40-50% narrowing  Mild aortic stenosis with peak to peak gradient of 20 mmHg. Heavy calcification on echocardiography.  Normal LV systolic function with mildly elevated end-diastolic pressure consistent with diastolic heart failure.  RECOMMENDATION:  With the accompanying abnormal nuclear study demonstrating anterior wall and apical ischemia, the patient will be referred to [facility] for coronary bypass grafting given distal left main and ostial LAD involvement.   The aortic valve is diseased but not severely stenosed. Recent echo did not demonstrate any significant aortic regurgitation. LV size and function were normal. It does not appear that valve replacement/therapy is indicated at this time but should be debated.    ------------------------------------------------------------------- LV EF: 60% -   65%  ------------------------------------------------------------------- Indications:      Murmur 785.2.  ------------------------------------------------------------------- History:   PMH:  Anxiety. Patient has a history of severe white coat hypertension. Pt reported no dizziness, headaches or double vision at the time of test. Former Smoker.  Risk factors: Hypertension.  Dyslipidemia.  ------------------------------------------------------------------- Study Conclusions  - Left ventricle: The cavity size was normal. Wall thickness was   increased in a pattern of moderate LVH. Systolic function was   normal. The estimated ejection fraction was in the range of 60%   to 65%. Doppler parameters are consistent with abnormal left   ventricular relaxation (grade 1 diastolic dysfunction).   Indeterminate filling pressures. - Ventricular septum: Septal motion showed abnormal function and   dyssynergy. These changes are consistent with intraventricular   conduction delay. - Aortic valve: Moderately to severely calcified annulus.   Moderately thickened, moderately calcified leaflets. There was   moderate stenosis. Peak velocity (S): 324 cm/s. Mean gradient   (S): 23 mm Hg. Valve area (VTI): 1.44 cm^2. Valve area (Vmax):   1.39 cm^2. Valve area (Vmean): 1.36 cm^2.  ------------------------------------------------------------------- Study data:  No prior study was available for comparison.  Study status:  Routine.  Procedure:  Transthoracic echocardiography. Image quality was adequate.          Transthoracic echocardiography.  M-mode, complete 2D, spectral Doppler, and color Doppler.  Birthdate:  Patient birthdate: 16-Sep-1955.  Age:  Patient is 61 yr old.  Sex:  Gender: male.    BMI: 29.7 kg/m^2.  Blood pressure:     204/93  Patient status:  Inpatient.  Study date: Study date: 10/29/2016. Study time: 11:18 AM.  -------------------------------------------------------------------  ------------------------------------------------------------------- Left ventricle:  The cavity size was normal. Wall thickness was increased in a pattern of moderate LVH. Systolic function was normal. The estimated ejection fraction was in the range of 60% to 65%. Doppler parameters are consistent with abnormal left ventricular relaxation (grade 1 diastolic  dysfunction). Indeterminate filling pressures.  ------------------------------------------------------------------- Aortic valve:   Moderately to severely calcified annulus. Moderately thickened, moderately calcified leaflets.  Doppler: There was moderate stenosis.      VTI ratio of LVOT to aortic valve: 0.42. Valve area (VTI): 1.44 cm^2. Indexed valve area (VTI): 0.69 cm^2/m^2. Peak velocity ratio of LVOT to aortic valve: 0.4. Valve area (Vmax): 1.39 cm^2. Indexed valve area (Vmax): 0.67 cm^2/m^2. Mean velocity ratio of LVOT to aortic valve: 0.39. Valve area (Vmean): 1.36 cm^2. Indexed valve area (Vmean): 0.65 cm^2/m^2.    Mean gradient (S): 23 mm Hg. Peak gradient (S): 42 mm Hg.  ------------------------------------------------------------------- Aorta:  Aortic root: The aortic root was normal in  size.  ------------------------------------------------------------------- Mitral valve:   Normal thickness leaflets .  Doppler:  There was no significant regurgitation.    Peak gradient (D): 3 mm Hg.  ------------------------------------------------------------------- Left atrium:  The atrium was normal in size.  ------------------------------------------------------------------- Atrial septum:  No defect or patent foramen ovale was identified.   ------------------------------------------------------------------- Right ventricle:  The cavity size was normal. Wall thickness was normal. Systolic function was normal.  ------------------------------------------------------------------- Ventricular septum:   Septal motion showed abnormal function and dyssynergy. These changes are consistent with intraventricular conduction delay.  ------------------------------------------------------------------- Pulmonic valve:    The valve appears to be grossly normal. Doppler:  There was no significant regurgitation.  ------------------------------------------------------------------- Right  atrium:  The atrium was normal in size.  ------------------------------------------------------------------- Systemic veins: Inferior vena cava: The vessel was normal in size. The respirophasic diameter changes were in the normal range (>= 50%), consistent with normal central venous pressure.  ------------------------------------------------------------------- Measurements   Left ventricle                           Value          Reference  LV ID, ED, PLAX chordal          (L)     36.2  mm       43 - 52  LV ID, ES, PLAX chordal                  26.9  mm       23 - 38  LV fx shortening, PLAX chordal   (L)     26    %        >=29  LV PW thickness, ED                      13.7  mm       ----------  IVS/LV PW ratio, ED                      0.93           <=1.3  Stroke volume, 2D                        92    ml       ----------  Stroke volume/bsa, 2D                    44    ml/m^2   ----------  LV e&', lateral                           7.29  cm/s     ----------  LV E/e&', lateral                         12.3           ----------  LV e&', medial                            6.31  cm/s     ----------  LV E/e&', medial                          14.22          ----------  LV e&', average  6.8   cm/s     ----------  LV E/e&', average                         13.19          ----------    Ventricular septum                       Value          Reference  IVS thickness, ED                        12.7  mm       ----------    LVOT                                     Value          Reference  LVOT ID, S                               21    mm       ----------  LVOT area                                3.46  cm^2     ----------  LVOT peak velocity, S                    130   cm/s     ----------  LVOT mean velocity, S                    88.6  cm/s     ----------  LVOT VTI, S                              26.6  cm       ----------  LVOT peak gradient, S                    7      mm Hg    ----------    Aortic valve                             Value          Reference  Aortic valve peak velocity, S            324   cm/s     ----------  Aortic valve mean velocity, S            225   cm/s     ----------  Aortic valve VTI, S                      63.9  cm       ----------  Aortic mean gradient, S                  23    mm Hg    ----------  Aortic peak gradient, S                  42    mm Hg    ----------  VTI ratio, LVOT/AV                       0.42           ----------  Aortic valve area, VTI                   1.44  cm^2     ----------  Aortic valve area/bsa, VTI               0.69  cm^2/m^2 ----------  Velocity ratio, peak, LVOT/AV            0.4            ----------  Aortic valve area, peak velocity         1.39  cm^2     ----------  Aortic valve area/bsa, peak              0.67  cm^2/m^2 ----------  velocity  Velocity ratio, mean, LVOT/AV            0.39           ----------  Aortic valve area, mean velocity         1.36  cm^2     ----------  Aortic valve area/bsa, mean              0.65  cm^2/m^2 ----------  velocity    Aorta                                    Value          Reference  Aortic root ID, ED                       27    mm       ----------    Left atrium                              Value          Reference  LA ID, A-P, ES                           33    mm       ----------  LA ID/bsa, A-P                           1.58  cm/m^2   <=2.2  LA volume, S                             59.7  ml       ----------  LA volume/bsa, S                         28.6  ml/m^2   ----------  LA volume, ES, 1-p A4C                   61.1  ml       ----------  LA volume/bsa, ES, 1-p A4C               29.3  ml/m^2   ----------  LA volume, ES, 1-p A2C  57.5  ml       ----------  LA volume/bsa, ES, 1-p A2C               27.6  ml/m^2   ----------    Mitral valve                             Value          Reference  Mitral E-wave peak velocity               89.7  cm/s     ----------  Mitral A-wave peak velocity              123   cm/s     ----------  Mitral deceleration time         (H)     243   ms       150 - 230  Mitral peak gradient, D                  3     mm Hg    ----------  Mitral E/A ratio, peak                   0.7            ----------    Right atrium                             Value          Reference  RA ID, S-I, ES, A4C                      35.9  mm       34 - 49  RA area, ES, A4C                         9.58  cm^2     8.3 - 19.5  RA volume, ES, A/L                       22.1  ml       ----------  RA volume/bsa, ES, A/L                   10.6  ml/m^2   ----------    Systemic veins                           Value          Reference  Estimated CVP                            3     mm Hg    ----------    Right ventricle                          Value          Reference  TAPSE                                    21.4  mm       ----------  RV s&', lateral, S  14.8  cm/s     ----------  Legend: (L)  and  (H)  mark values outside specified reference range.  ------------------------------------------------------------------- Prepared and Electronically Authenticated by  Prentice Docker, MD 2018-03-14T14:53:52  Hemo Data    Most Recent Value  AO Systolic Pressure 132 mmHg  AO Diastolic Pressure 61 mmHg  AO Mean 87 mmHg  LV Systolic Pressure 151 mmHg  LV Diastolic Pressure 13 mmHg  LV EDP 21 mmHg  Arterial Occlusion Pressure Extended Systolic Pressure 131 mmHg  Arterial Occlusion Pressure Extended Diastolic Pressure 60 mmHg  Arterial Occlusion Pressure Extended Mean Pressure 86 mmHg  Left Ventricular Apex Extended Systolic Pressure 154 mmHg  Left Ventricular Apex Extended Diastolic Pressure 11 mmHg  Left Ventricular Apex Extended EDP Pressure 19 mmHg      Study Result    Findings consistent with prior large anteroseptal,septal,inferoseptal infarct with moderate peri-infarct ischemia.  There is a moderate sized apical infarct with moderate peri-infarct ischemia.  This is a high risk study.  The left ventricular ejection fraction is normal (55-65%).      Assessment / Plan:   Patient with asymptomatic coronary artery disease but positive stress test with three-vessel coronary artery disease And left main obstruction, in addition he has a calcified aortic valve, with moderate aortic stenosis. Peak velocity (S): 324 cm/s. . I discussed the recommendation with the patient to proceed with coronary artery bypass grafting and likely aortic valve replacement. I discussed mechanical versus tissue valve with the patient is prefers a tissue valve, does not wish to take Coumadin. With his positive stress test and left main obstruction we'll plan to proceed with surgery later this week, it did not seem prudent to wait extended period time until the patient has dental caps placed. The risk of surgery including death infection stroke myocardial infarction bleeding blood transfusion possible need for permanent pacemaker all discussed in detail.   The goals risks and alternatives of the planned surgical procedure Procedure(s): CORONARY ARTERY BYPASS GRAFTING (CABG) (N/A) TRANSESOPHAGEAL ECHOCARDIOGRAM (TEE) (N/A) possible,AORTIC VALVE REPLACEMENT (AVR) (N/A)  have been discussed with the patient in detail. The risks of the procedure including death, infection, stroke, myocardial infarction, bleeding, blood transfusion, heart block have all been discussed specifically.  I have quoted Samuel Cook a 2% of perioperative mortality and a complication rate as high as 40 %. The patient's questions have been answered.Samuel Cook is willing  to proceed with the planned procedure.  Delight Ovens MD      [address] [place].[address] ,[place] [address] Office [phone]   Beeper [phone]  12/26/2016 7:06 AM

## 2016-12-26 NOTE — OR Nursing (Signed)
[id number] Chest incision made per Dr. Tyrone SageGerhardt.

## 2016-12-26 NOTE — Anesthesia Preprocedure Evaluation (Signed)
Anesthesia Evaluation  Patient identified by MRN, date of birth, ID band Patient awake    Reviewed: Allergy & Precautions, NPO status , Patient's Chart, lab work & pertinent test results  History of Anesthesia Complications Negative for: history of anesthetic complications  Airway Mallampati: III  TM Distance: <3 FB Neck ROM: Full    Dental  (+) Teeth Intact   Pulmonary neg shortness of breath, neg sleep apnea, neg COPD, neg recent URI, former smoker,    breath sounds clear to auscultation       Cardiovascular hypertension, Pt. on medications and Pt. on home beta blockers + CAD   Rhythm:Regular + Systolic murmurs    Neuro/Psych PSYCHIATRIC DISORDERS Anxiety negative neurological ROS     GI/Hepatic Neg liver ROS, GERD  Medicated and Controlled,  Endo/Other  negative endocrine ROS  Renal/GU negative Renal ROS     Musculoskeletal   Abdominal   Peds  Hematology negative hematology ROS (+)   Anesthesia Other Findings   Reproductive/Obstetrics                             Anesthesia Physical Anesthesia Plan  ASA: IV  Anesthesia Plan: General   Post-op Pain Management:    Induction: Intravenous  Airway Management Planned: Oral ETT  Additional Equipment: Arterial line, TEE, CVP, PA Cath and Ultrasound Guidance Line Placement  Intra-op Plan:   Post-operative Plan: Post-operative intubation/ventilation  Informed Consent: I have reviewed the patients History and Physical, chart, labs and discussed the procedure including the risks, benefits and alternatives for the proposed anesthesia with the patient or authorized representative who has indicated his/her understanding and acceptance.   Dental advisory given  Plan Discussed with: CRNA and Surgeon  Anesthesia Plan Comments:         Anesthesia Quick Evaluation

## 2016-12-26 NOTE — OR Nursing (Signed)
1012 Right leg incision closed per Jari Favreessa Conte, PA-C.

## 2016-12-26 NOTE — OR Nursing (Signed)
1ST CALL TO SICU @1238 

## 2016-12-26 NOTE — OR Nursing (Signed)
[id number] Right leg incision made per Jari Favreessa Conte, PA-C  for endoscopic harvesting of right saphenous vein.

## 2016-12-26 NOTE — Anesthesia Procedure Notes (Signed)
Procedure Name: Intubation Date/Time: 12/26/2016 8:02 AM Performed by: Rejeana Brock L Pre-anesthesia Checklist: Patient identified, Emergency Drugs available, Suction available and Patient being monitored Patient Re-evaluated:Patient Re-evaluated prior to inductionOxygen Delivery Method: Circle System Utilized Preoxygenation: Pre-oxygenation with 100% oxygen Intubation Type: IV induction Ventilation: Mask ventilation without difficulty and Oral airway inserted - appropriate to patient size Laryngoscope Size: Mac and 4 Grade View: Grade III Tube type: Oral Tube size: 8.0 mm Number of attempts: 1 Airway Equipment and Method: Stylet and Oral airway Placement Confirmation: ETT inserted through vocal cords under direct vision,  positive ETCO2 and breath sounds checked- equal and bilateral Secured at: 22 cm Tube secured with: Tape Dental Injury: Teeth and Oropharynx as per pre-operative assessment  Difficulty Due To: Difficult Airway- due to anterior larynx Future Recommendations: Recommend- induction with short-acting agent, and alternative techniques readily available

## 2016-12-26 NOTE — Progress Notes (Signed)
  Echocardiogram Echocardiogram Transesophageal has been performed.  [name], [name] 12/26/2016, 8:44 AM

## 2016-12-26 NOTE — Brief Op Note (Addendum)
      [address]       [place] [id number]             [phone]     12/26/2016  11:37 AM  PATIENT:  Loa Socksale W Boese  61 y.o. male  PRE-OPERATIVE DIAGNOSIS:   1. Coronary artery disease involving the ostial left main 2. Mild to moderate AS  POST-OPERATIVE DIAGNOSIS:   1. CAD post CABG x2 2. Mild AS by TEE  PROCEDURE:  Procedure(s): CORONARY ARTERY BYPASS GRAFTING (CABG) x 3 WITH ENDOSCOPIC HARVESTING OF RIGHT SAPHENOUS VEIN (N/A) TRANSESOPHAGEAL ECHOCARDIOGRAM (TEE) (N/A)  SURGEON:  Surgeon(s) and Role:    * Delight OvensGerhardt, Lynton Crescenzo B, MD - Primary  PHYSICIAN ASSISTANT:  Jari Favreessa Conte, PA-C   ANESTHESIA:   general  EBL:  Total I/O In: 1000 [I.V.:1000] Out: 320 [Urine:275; Blood:45]  BLOOD ADMINISTERED:none  DRAINS: ROUTINE   LOCAL MEDICATIONS USED:  NONE  SPECIMEN:  No Specimen  DISPOSITION OF SPECIMEN:  N/A  COUNTS:  YES  TOURNIQUET:  * No tourniquets in log *  DICTATION: .Dragon Dictation  PLAN OF CARE: Admit to inpatient   PATIENT DISPOSITION:  ICU - intubated and hemodynamically stable.   Delay start of Pharmacological VTE agent (>24hrs) due to surgical blood loss or risk of bleeding: yes

## 2016-12-27 ENCOUNTER — Inpatient Hospital Stay (HOSPITAL_COMMUNITY): Payer: 59

## 2016-12-27 LAB — GLUCOSE, CAPILLARY
Glucose-Capillary: 111 mg/dL — ABNORMAL HIGH (ref 65–99)
Glucose-Capillary: 95 mg/dL (ref 65–99)
Glucose-Capillary: 257 mg/dL — ABNORMAL HIGH (ref 65–99)
Glucose-Capillary: 127 mg/dL — ABNORMAL HIGH (ref 65–99)
Glucose-Capillary: 157 mg/dL — ABNORMAL HIGH (ref 65–99)

## 2016-12-27 LAB — MAGNESIUM
Magnesium: 2.1 mg/dL (ref 1.7–2.4)
Magnesium: 2.1 mg/dL (ref 1.7–2.4)

## 2016-12-27 LAB — BASIC METABOLIC PANEL
ANION GAP: 7 (ref 5–15)
BUN: 11 mg/dL (ref 6–20)
CALCIUM: 8.2 mg/dL — AB (ref 8.9–10.3)
CO2: 24 mmol/L (ref 22–32)
Chloride: 105 mmol/L (ref 101–111)
Creatinine, Ser: 0.89 mg/dL (ref 0.61–1.24)
GFR calc Af Amer: >60 mL/min (ref >60)
GFR calc non Af Amer: >60 mL/min (ref >60)
GLUCOSE: 118 mg/dL — AB (ref 65–99)
POTASSIUM: 4.4 mmol/L (ref 3.5–5.1)
Sodium: 136 mmol/L (ref 135–145)

## 2016-12-27 LAB — CBC
HCT: 39.3 % (ref 39.0–52.0)
HEMATOCRIT: 38.4 % — AB (ref 39.0–52.0)
Hemoglobin: 12.8 g/dL — ABNORMAL LOW (ref 13.0–17.0)
Hemoglobin: 13 g/dL (ref 13.0–17.0)
MCH: 32.6 pg (ref 26.0–34.0)
MCH: 33 pg (ref 26.0–34.0)
MCHC: 33.3 g/dL (ref 30.0–36.0)
MCHC: 33.1 g/dL (ref 30.0–36.0)
MCV: 97.7 fL (ref 78.0–100.0)
MCV: 99.7 fL (ref 78.0–100.0)
Platelets: 184 10*3/uL (ref 150–400)
Platelets: 186 10*3/uL (ref 150–400)
RBC: 3.93 MIL/uL — ABNORMAL LOW (ref 4.22–5.81)
RBC: 3.94 MIL/uL — ABNORMAL LOW (ref 4.22–5.81)
RDW: 12.1 % (ref 11.5–15.5)
RDW: 12.4 % (ref 11.5–15.5)
WBC: 16.9 10*3/uL — AB (ref 4.0–10.5)
WBC: 22.6 10*3/uL — ABNORMAL HIGH (ref 4.0–10.5)

## 2016-12-27 LAB — POCT I-STAT, CHEM 8
BUN: 15 mg/dL (ref 6–20)
Calcium, Ion: 1.26 mmol/L (ref 1.15–1.40)
Chloride: 96 mmol/L — ABNORMAL LOW (ref 101–111)
Creatinine, Ser: 1 mg/dL (ref 0.61–1.24)
Glucose, Bld: 139 mg/dL — ABNORMAL HIGH (ref 65–99)
HCT: 39 % (ref 39.0–52.0)
Hemoglobin: 13.3 g/dL (ref 13.0–17.0)
Potassium: 4.5 mmol/L (ref 3.5–5.1)
Sodium: 135 mmol/L (ref 135–145)
TCO2: 29 mmol/L (ref 0–100)

## 2016-12-27 LAB — CREATININE, SERUM
CREATININE: 1.04 mg/dL (ref 0.61–1.24)
GFR calc Af Amer: >60 mL/min (ref >60)
GFR calc non Af Amer: >60 mL/min (ref >60)

## 2016-12-27 MED ORDER — GUAIFENESIN ER 600 MG PO TB12
1200.0000 mg | ORAL_TABLET | Freq: Two times a day (BID) | ORAL | Status: DC
  Administered 2016-12-27 – 2016-12-31 (×8): 1200 mg via ORAL
  Filled 2016-12-27 (×9): qty 2

## 2016-12-27 MED ORDER — ENOXAPARIN SODIUM 30 MG/0.3ML ~~LOC~~ SOLN
30.0000 mg | SUBCUTANEOUS | Status: DC
  Administered 2016-12-28 – 2016-12-31 (×4): 30 mg via SUBCUTANEOUS
  Filled 2016-12-27 (×4): qty 0.3

## 2016-12-27 MED ORDER — ORAL CARE MOUTH RINSE
15.0000 mL | Freq: Two times a day (BID) | OROMUCOSAL | Status: DC
  Administered 2016-12-28 – 2016-12-30 (×3): 15 mL via OROMUCOSAL

## 2016-12-27 MED ORDER — FUROSEMIDE 10 MG/ML IJ SOLN
20.0000 mg | Freq: Once | INTRAMUSCULAR | Status: AC
  Administered 2016-12-27: 20 mg via INTRAVENOUS
  Filled 2016-12-27: qty 2

## 2016-12-27 MED ORDER — PANTOPRAZOLE SODIUM 40 MG PO TBEC
40.0000 mg | DELAYED_RELEASE_TABLET | Freq: Every day | ORAL | Status: DC
  Administered 2016-12-27 – 2016-12-31 (×5): 40 mg via ORAL
  Filled 2016-12-27 (×4): qty 1

## 2016-12-27 NOTE — Progress Notes (Signed)
[facility] BRIEF SICU PROGRESS NOTE  1 Day Post-Op  S/P Procedure(s) (LRB): CORONARY ARTERY BYPASS GRAFTING (CABG) x 3 WITH ENDOSCOPIC HARVESTING OF RIGHT SAPHENOUS VEIN (N/A) TRANSESOPHAGEAL ECHOCARDIOGRAM (TEE) (N/A)   Stable day NSR w/ stable BP Breathing comfortably w/ O2 sats 92-94% on 2 L/min UOP adequate  Plan: Continue routine care  Purcell Nailslarence H Jamila Slatten, MD 12/27/2016 4:48 PM

## 2016-12-27 NOTE — Progress Notes (Addendum)
[address]       [place] [id number]             [phone]        CARDIOTHORACIC SURGERY PROGRESS NOTE   R1 Day Post-Op Procedure(s) (LRB): CORONARY ARTERY BYPASS GRAFTING (CABG) x 3 WITH ENDOSCOPIC HARVESTING OF RIGHT SAPHENOUS VEIN (N/A) TRANSESOPHAGEAL ECHOCARDIOGRAM (TEE) (N/A)  Subjective: Looks good.  Mild soreness in chest.  No SOB.  No nausea  Objective: Vital signs: BP Readings from Last 1 Encounters:  12/27/16 123/66   Pulse Readings from Last 1 Encounters:  12/27/16 80   Resp Readings from Last 1 Encounters:  12/27/16 (!) 23   Temp Readings from Last 1 Encounters:  12/27/16 99.3 F (37.4 C)    Hemodynamics: PAP: (17-47)/(4-25) 33/13 CO:  [4.2 L/min-6.8 L/min] 6.3 L/min CI:  [2.1 L/min/m2-3.4 L/min/m2] 3.1 L/min/m2  Physical Exam:  Rhythm:   sinus  Breath sounds: clear  Heart sounds:  RRR  Incisions:  Dressings dry, intact  Abdomen:  Soft, non-distended, non-tender  Extremities:  Warm, well-perfused  Chest tubes:  low volume thin serosanguinous output, no air leak    Intake/Output from previous day: 05/11 0701 - 05/12 0700 In: 3298.8 [I.V.:1865.8; Blood:503; IV Piggyback:930] Out: 4185 [Urine:2385; Blood:1250; Chest Tube:550] Intake/Output this shift: No intake/output data recorded.  Lab Results:  CBC: Recent Labs  12/26/16 1935 12/26/16 1939 12/27/16 0400  WBC 16.3*  --  16.9*  HGB 13.2 12.2* 12.8*  HCT 38.4* 36.0* 38.4*  PLT 169  --  184    BMET:  Recent Labs  12/25/16 0841  12/26/16 1939 12/27/16 0400  NA 135  < > 138 136  K 4.2  < > 4.6 4.4  CL 103  < > 104 105  CO2 22  --   --  24  GLUCOSE 112*  < > 129* 118*  BUN 15  < > 13 11  CREATININE 0.82  < > 0.80 0.89  CALCIUM 9.4  --   --  8.2*  < > = values in this interval not displayed.   PT/INR:   Recent Labs  12/26/16 1359  LABPROT 16.8*  INR 1.35    CBG (last 3)   Recent Labs  12/26/16 2316 12/27/16 0332 12/27/16 0815  GLUCAP  132* 111* 95    ABG    Component Value Date/Time   PHART 7.350 12/26/2016 1944   PCO2ART 42.6 12/26/2016 1944   PO2ART 60.0 (L) 12/26/2016 1944   HCO3 23.3 12/26/2016 1944   TCO2 25 12/26/2016 1944   ACIDBASEDEF 2.0 12/26/2016 1944   O2SAT 88.0 12/26/2016 1944    CXR: PORTABLE CHEST 1 VIEW  COMPARISON:  Dec 26, 2016  FINDINGS: The ET and NG tubes have been removed. The PA catheter, left chest tube, and mediastinal drain are stable. No pneumothorax. Opacity in the left base remains, probably atelectasis. Mild increased atelectasis in the right base. No other change.  IMPRESSION: ET and NG tubes have been removed. Other support apparatus is stable with no pneumothorax. Bibasilar opacities, likely atelectasis.   Electronically Signed   By: Gerome Samavid  Williams III M.D   On: 12/27/2016 08:01   EKG: NSR w/out acute ischemic changes, LBBB    Assessment/Plan: S/P Procedure(s) (LRB): CORONARY ARTERY BYPASS GRAFTING (CABG) x 3 WITH ENDOSCOPIC HARVESTING OF RIGHT SAPHENOUS VEIN (N/A) TRANSESOPHAGEAL ECHOCARDIOGRAM (TEE) (N/A)  Doing well POD1 Maintaining NSR w/ stable hemodynamics, no drips Breathing comfortably w/ O2 sats 95% on 5  L/min Expected post op acute blood loss anemia, mild Expected post op volume excess, weight reportedly 6 lbs > preop Expected post op atelectasis, mild   Mobilize  D/C lines D/C tubes later today or tomorrow, depending on output Diuresis    Purcell Nails, MD 12/27/2016 9:02 AM

## 2016-12-28 ENCOUNTER — Inpatient Hospital Stay (HOSPITAL_COMMUNITY): Payer: 59

## 2016-12-28 LAB — CBC
HEMATOCRIT: 37.1 % — AB (ref 39.0–52.0)
HEMOGLOBIN: 12.5 g/dL — AB (ref 13.0–17.0)
MCH: 33.6 pg (ref 26.0–34.0)
MCHC: 33.7 g/dL (ref 30.0–36.0)
MCV: 99.7 fL (ref 78.0–100.0)
Platelets: 156 10*3/uL (ref 150–400)
RBC: 3.72 MIL/uL — ABNORMAL LOW (ref 4.22–5.81)
RDW: 12.7 % (ref 11.5–15.5)
WBC: 19.7 10*3/uL — AB (ref 4.0–10.5)

## 2016-12-28 LAB — GLUCOSE, CAPILLARY
Glucose-Capillary: 125 mg/dL — ABNORMAL HIGH (ref 65–99)
Glucose-Capillary: 131 mg/dL — ABNORMAL HIGH (ref 65–99)
Glucose-Capillary: 134 mg/dL — ABNORMAL HIGH (ref 65–99)
Glucose-Capillary: 161 mg/dL — ABNORMAL HIGH (ref 65–99)
Glucose-Capillary: 97 mg/dL (ref 65–99)

## 2016-12-28 LAB — BASIC METABOLIC PANEL
ANION GAP: 7 (ref 5–15)
BUN: 14 mg/dL (ref 6–20)
CHLORIDE: 98 mmol/L — AB (ref 101–111)
CO2: 28 mmol/L (ref 22–32)
Calcium: 8.7 mg/dL — ABNORMAL LOW (ref 8.9–10.3)
Creatinine, Ser: 0.91 mg/dL (ref 0.61–1.24)
GFR calc Af Amer: >60 mL/min (ref >60)
GFR calc non Af Amer: >60 mL/min (ref >60)
Glucose, Bld: 135 mg/dL — ABNORMAL HIGH (ref 65–99)
POTASSIUM: 4.5 mmol/L (ref 3.5–5.1)
SODIUM: 133 mmol/L — AB (ref 135–145)

## 2016-12-28 MED ORDER — FUROSEMIDE 40 MG PO TABS
40.0000 mg | ORAL_TABLET | Freq: Every day | ORAL | Status: DC
  Administered 2016-12-28 – 2016-12-31 (×4): 40 mg via ORAL
  Filled 2016-12-28 (×4): qty 1

## 2016-12-28 MED ORDER — SODIUM CHLORIDE 0.9 % IV SOLN: 250.0000 mL | INTRAVENOUS | Status: DC | PRN

## 2016-12-28 MED ORDER — LORAZEPAM 0.5 MG PO TABS: 0.5000 mg | ORAL_TABLET | Freq: Every day | ORAL | Status: DC | PRN

## 2016-12-28 MED ORDER — SODIUM CHLORIDE 0.9% FLUSH
3.0000 mL | Freq: Two times a day (BID) | INTRAVENOUS | Status: DC
  Administered 2016-12-28 – 2016-12-31 (×7): 3 mL via INTRAVENOUS

## 2016-12-28 MED ORDER — NEBIVOLOL HCL 10 MG PO TABS
10.0000 mg | ORAL_TABLET | Freq: Every day | ORAL | Status: DC
  Administered 2016-12-29 – 2016-12-31 (×3): 10 mg via ORAL
  Filled 2016-12-28 (×3): qty 1

## 2016-12-28 MED ORDER — METOPROLOL TARTRATE 12.5 MG HALF TABLET
12.5000 mg | ORAL_TABLET | Freq: Two times a day (BID) | ORAL | Status: AC
  Administered 2016-12-28: 12.5 mg via ORAL
  Filled 2016-12-28: qty 1

## 2016-12-28 MED ORDER — LISINOPRIL 10 MG PO TABS
20.0000 mg | ORAL_TABLET | Freq: Every day | ORAL | Status: DC
  Administered 2016-12-29 – 2016-12-31 (×3): 20 mg via ORAL
  Filled 2016-12-28 (×3): qty 2

## 2016-12-28 MED ORDER — MOVING RIGHT ALONG BOOK
Freq: Once | Status: AC
  Administered 2016-12-28: 10:00:00
  Filled 2016-12-28: qty 1

## 2016-12-28 MED ORDER — SODIUM CHLORIDE 0.9% FLUSH: 3.0000 mL | INTRAVENOUS | Status: DC | PRN

## 2016-12-28 MED ORDER — POTASSIUM CHLORIDE CRYS ER 20 MEQ PO TBCR
20.0000 meq | EXTENDED_RELEASE_TABLET | Freq: Every day | ORAL | Status: DC
  Administered 2016-12-28 – 2016-12-31 (×4): 20 meq via ORAL
  Filled 2016-12-28 (×4): qty 1

## 2016-12-28 MED ORDER — INSULIN ASPART 100 UNIT/ML ~~LOC~~ SOLN
0.0000 [IU] | Freq: Three times a day (TID) | SUBCUTANEOUS | Status: DC
  Administered 2016-12-28 (×2): 2 [IU] via SUBCUTANEOUS
  Administered 2016-12-30: 4 [IU] via SUBCUTANEOUS
  Administered 2016-12-30: 2 [IU] via SUBCUTANEOUS

## 2016-12-28 NOTE — Progress Notes (Signed)
[address]       [place] [address]             [phone]        CARDIOTHORACIC SURGERY PROGRESS NOTE   R2 Days Post-Op Procedure(s) (LRB): CORONARY ARTERY BYPASS GRAFTING (CABG) x 3 WITH ENDOSCOPIC HARVESTING OF RIGHT SAPHENOUS VEIN (N/A) TRANSESOPHAGEAL ECHOCARDIOGRAM (TEE) (N/A)  Subjective: Looks good and feels well.  Mild soreness in chest and productive cough.  No SOB.  Good appetite.  Objective: Vital signs: BP Readings from Last 1 Encounters:  12/28/16 (!) 143/71   Pulse Readings from Last 1 Encounters:  12/28/16 76   Resp Readings from Last 1 Encounters:  12/28/16 14   Temp Readings from Last 1 Encounters:  12/28/16 98.1 F (36.7 C) (Oral)    Hemodynamics:    Physical Exam:  Rhythm:   sinus  Breath sounds: clear  Heart sounds:  RRR  Incisions:  Dressing dry, intact  Abdomen:  Soft, non-distended, non-tender  Extremities:  Warm, well-perfused  Chest tubes:  low volume thin serosanguinous output, no air leak    Intake/Output from previous day: 05/12 0701 - 05/13 0700 In: 800 [P.O.:180; I.V.:520; IV Piggyback:100] Out: 2325 [Urine:2005; Chest Tube:320] Intake/Output this shift: Total I/O In: 283 [P.O.:240; I.V.:43] Out: 225 [Chest Tube:225]  Lab Results:  CBC: Recent Labs  12/27/16 1626 12/27/16 1629 12/28/16 0321  WBC 22.6*  --  19.7*  HGB 13.0 13.3 12.5*  HCT 39.3 39.0 37.1*  PLT 186  --  156    BMET:  Recent Labs  12/27/16 0400  12/27/16 1629 12/28/16 0321  NA 136  --  135 133*  K 4.4  --  4.5 4.5  CL 105  --  96* 98*  CO2 24  --   --  28  GLUCOSE 118*  --  139* 135*  BUN 11  --  15 14  CREATININE 0.89  < > 1.00 0.91  CALCIUM 8.2*  --   --  8.7*  < > = values in this interval not displayed.   PT/INR:   Recent Labs  12/26/16 1359  LABPROT 16.8*  INR 1.35    CBG (last 3)   Recent Labs  12/27/16 2354 12/28/16 0325 12/28/16 0819  GLUCAP 134* 125* 161*    ABG    Component Value  Date/Time   PHART 7.350 12/26/2016 1944   PCO2ART 42.6 12/26/2016 1944   PO2ART 60.0 (L) 12/26/2016 1944   HCO3 23.3 12/26/2016 1944   TCO2 29 12/27/2016 1629   ACIDBASEDEF 2.0 12/26/2016 1944   O2SAT 88.0 12/26/2016 1944    CXR: PORTABLE CHEST 1 VIEW  COMPARISON:  Dec 27, 2016  FINDINGS: The PA catheter has been removed leaving a right IJ sheath. A left chest tube remains in place. No pneumothorax. Increasing opacity in the right base may be atelectasis. Opacity in the left base is slightly increased, likely atelectasis. The cardiomediastinal silhouette is stable.  IMPRESSION: 1. Support apparatus as above. 2. Increasing bibasilar opacities, favored to represent atelectasis. Recommend attention on follow-up.   Electronically Signed   By: Gerome Sam III M.D   On: 12/28/2016 07:52   Assessment/Plan: S/P Procedure(s) (LRB): CORONARY ARTERY BYPASS GRAFTING (CABG) x 3 WITH ENDOSCOPIC HARVESTING OF RIGHT SAPHENOUS VEIN (N/A) TRANSESOPHAGEAL ECHOCARDIOGRAM (TEE) (N/A)  Doing well POD2 Maintaining NSR w/ stable BP Breathing comfortably w/ O2 sats 93-95% on 3 L/min Expected post op acute blood loss anemia, mild, stable Expected post  op volume excess, diuresing some, weight down 1 lb but reportedly 5 lbs > preop Expected post op atelectasis, mild Leukocytosis w/out fever, likely reactive   Mobilize  D/C tubes  Diuresis  Restart home beta blocker and ACE-I tomorrow if BP will tolerate  Transfer 2W   Purcell Nailslarence H Owen, MD 12/28/2016 10:00 AM

## 2016-12-29 ENCOUNTER — Inpatient Hospital Stay (HOSPITAL_COMMUNITY): Payer: 59

## 2016-12-29 ENCOUNTER — Encounter (HOSPITAL_COMMUNITY): Payer: Self-pay | Admitting: Cardiothoracic Surgery

## 2016-12-29 LAB — CBC
HEMATOCRIT: 36.7 % — AB (ref 39.0–52.0)
Hemoglobin: 12.2 g/dL — ABNORMAL LOW (ref 13.0–17.0)
MCH: 33.1 pg (ref 26.0–34.0)
MCHC: 33.2 g/dL (ref 30.0–36.0)
MCV: 99.5 fL (ref 78.0–100.0)
Platelets: 153 10*3/uL (ref 150–400)
RBC: 3.69 MIL/uL — AB (ref 4.22–5.81)
RDW: 12.5 % (ref 11.5–15.5)
WBC: 13.4 10*3/uL — AB (ref 4.0–10.5)

## 2016-12-29 LAB — ECHO TEE
Ao-asc: 3 cm
LVOT diameter: 22 mm
Mean grad: 1 mmHg
STJ: 2.7 cm
Sinus: 3.6 cm

## 2016-12-29 LAB — GLUCOSE, CAPILLARY
Glucose-Capillary: 128 mg/dL — ABNORMAL HIGH (ref 65–99)
Glucose-Capillary: 109 mg/dL — ABNORMAL HIGH (ref 65–99)
Glucose-Capillary: 109 mg/dL — ABNORMAL HIGH (ref 65–99)
Glucose-Capillary: 114 mg/dL — ABNORMAL HIGH (ref 65–99)
Glucose-Capillary: 120 mg/dL — ABNORMAL HIGH (ref 65–99)

## 2016-12-29 LAB — BASIC METABOLIC PANEL
ANION GAP: 10 (ref 5–15)
BUN: 11 mg/dL (ref 6–20)
CHLORIDE: 100 mmol/L — AB (ref 101–111)
CO2: 28 mmol/L (ref 22–32)
Calcium: 8.2 mg/dL — ABNORMAL LOW (ref 8.9–10.3)
Creatinine, Ser: 0.83 mg/dL (ref 0.61–1.24)
GFR calc Af Amer: >60 mL/min (ref >60)
GFR calc non Af Amer: >60 mL/min (ref >60)
GLUCOSE: 115 mg/dL — AB (ref 65–99)
POTASSIUM: 3.9 mmol/L (ref 3.5–5.1)
Sodium: 138 mmol/L (ref 135–145)

## 2016-12-29 MED FILL — Magnesium Sulfate Inj 50%: INTRAMUSCULAR | Qty: 10 | Status: AC

## 2016-12-29 MED FILL — Heparin Sodium (Porcine) Inj 1000 Unit/ML: INTRAMUSCULAR | Qty: 30 | Status: AC

## 2016-12-29 MED FILL — Potassium Chloride Inj 2 mEq/ML: INTRAVENOUS | Qty: 40 | Status: AC

## 2016-12-29 NOTE — Anesthesia Postprocedure Evaluation (Addendum)
Anesthesia Post Note  Patient: Samuel Cook  Procedure(s) Performed: Procedure(s) (LRB): CORONARY ARTERY BYPASS GRAFTING (CABG) x 3 WITH ENDOSCOPIC HARVESTING OF RIGHT SAPHENOUS VEIN (N/A) TRANSESOPHAGEAL ECHOCARDIOGRAM (TEE) (N/A)  Patient location during evaluation: SICU Anesthesia Type: General Level of consciousness: sedated Pain management: pain level controlled Vital Signs Assessment: post-procedure vital signs reviewed and stable Respiratory status: patient remains intubated per anesthesia plan Cardiovascular status: stable Anesthetic complications: no       Last Vitals:  Vitals:   12/28/16 2101 12/29/16 0606  BP: (!) 136/59 (!) 133/58  Pulse: 74 73  Resp: 18 18  Temp: 37 C 37.1 C    Last Pain:  Vitals:   12/29/16 0606  TempSrc: Oral  PainSc:                  Coretta Leisey

## 2016-12-29 NOTE — Discharge Summary (Signed)
Physician Discharge Summary  Patient ID: Samuel Cook MRN: [id number] DOB/AGE: 02-11-1956 61 y.o.  Admit date: 12/26/2016 Discharge date: 12/31/2016  Admission Diagnoses: Patient Active Problem List   Diagnosis Date Noted  . Abnormal nuclear stress test 12/12/2016  . Aortic stenosis 11/03/2016  . Malignant hypertension 10/20/2016  . Essential hypertension 10/20/2016  . Hyperlipidemia 10/20/2016  . Coronary artery disease 10/20/2016    Discharge Diagnoses:  Active Problems:   S/P CABG x 3   Discharged Condition: good  HPI:  Samuel Cook 61 y.o. male is seen in the office  today for Consideration of coronary artery bypass grafting because of positive stress test and cardiac cath evidence of left main obstruction earlier this week the patient discontinued his fish oil patient also has noted an abnormal aortic valve. The patient denies any symptoms, he denies shortness of breath, denies angina, denies syncope, near syncope denies signs or symptoms of congestive heart failure. Last week he underwent cardiac catheterization after he had a positive stress test. Since last week the patient has been seen by his dentist had Panorex and thorough cleaning, is not thought to have any active infection but does need crowns placed.   Hospital Course:  Samuel Cook underwent a coronary bypass grafting by Dr. Tyrone Sage on 12/26/2016. He tolerated the procedure well and was transferred to the ICU. He was extubated in a timely manner. On postoperative day 1 he is doing well maintaining normal sinus rhythm with stable hemodynamics, on no vasopressors. He was breathing comfortably on 5 L/m. He did experience an postop volume excess therefore we initiated a diuretic regimen. We were able to discontinue his lines and chest tubes and continued to mobilize the patient. On postop day 2 he continued to progress. We continued diuresis for excess volume overload. He did have some expected postoperative atelectasis which  was mild. He did have some mild leukocytosis without fever which is likely reactive. We've restarted his home dose of beta blocker. We transferred him to the telemetry floor. Postop day 3 he continued to require 2 L nasal cannula for oxygen support. We encouraged incentive spirometry and pulmonary toilet. We continued his diuretic regimen. He did have some bloody drainage from the distal end of the sternal incision. We continued to encourage mobilization. Today he is ambulating with limited assistance, tolerating room air, and his incisions are healing well and he is ready for discharge. He is instructed to change his distal dressing twice a day and use betadine for cleaning. The patient understands he may call our office if there are any changes or he has questions.   Consults: None  Significant Diagnostic Studies:   CLINICAL DATA:  Chest pain.  Recent coronary artery bypass grafting  EXAM: CHEST  2 VIEW  COMPARISON:  Dec 28, 2016  FINDINGS: Temporary pacemaker wires are attached to the right heart. Cordis has been removed. No evident pneumothorax. There is a small pleural effusion on each side with bibasilar atelectasis. Lungs elsewhere are clear. Heart is upper normal in size with pulmonary vascularity within normal limits. There is aortic atherosclerosis. No evident bone lesions.  IMPRESSION: No pneumothorax. Bibasilar atelectasis with small pleural effusions bilaterally. No new opacity. Stable cardiac prominence. There is aortic atherosclerosis.   Electronically Signed   By: Samuel Cook M.D.   On: 12/29/2016 07:27    Treatments:   [address]       [place] [address]  [phone]                           12/26/2016  11:37 AM  PATIENT:  Samuel Cook  61 y.o. male  PRE-OPERATIVE DIAGNOSIS:   1. Coronary artery disease involving the ostial left main 2. Mild to moderate AS  POST-OPERATIVE DIAGNOSIS:   1. CAD post  CABG x2 2. Mild AS by TEE  PROCEDURE:  Procedure(s): CORONARY ARTERY BYPASS GRAFTING (CABG) x 3 WITH ENDOSCOPIC HARVESTING OF RIGHT SAPHENOUS VEIN (N/A) TRANSESOPHAGEAL ECHOCARDIOGRAM (TEE) (N/A)  SURGEON:  Surgeon(s) and Role:    * Delight Cook, Samuel B, MD - Primary  PHYSICIAN ASSISTANT:  Samuel Favreessa Cortlyn Cannell, PA-C   ANESTHESIA:   general  EBL:  Total I/O In: 1000 [I.V.:1000] Out: 320 [Urine:275; Blood:45]  BLOOD ADMINISTERED:none  DRAINS: ROUTINE   LOCAL MEDICATIONS USED:  NONE  SPECIMEN:  No Specimen  DISPOSITION OF SPECIMEN:  N/A  COUNTS:  YES  TOURNIQUET:  * No tourniquets in log *  DICTATION: .Dragon Dictation  PLAN OF CARE: Admit to inpatient   PATIENT DISPOSITION:  ICU - intubated and hemodynamically stable.   Delay start of Pharmacological VTE agent (>24hrs) due to surgical blood loss or risk of bleeding: yes     Discharge Exam: Blood pressure 140/68, pulse 70, temperature 99.3 F (37.4 C), temperature source Oral, resp. rate 20, height 5\' 10"  (1.778 m), weight 84.6 kg (186 lb 9.6 oz), SpO2 93 %.   General appearance: alert, cooperative and no distress Heart: regular rate and rhythm, S1, S2 normal, 3/6 systolic murmur, click, rub or gallop Lungs: clear to auscultation bilaterally Abdomen: soft, non-tender; bowel sounds normal; no masses,  no organomegaly Extremities: extremities normal, atraumatic, no cyanosis or edema Wound: small amount of bloody drainage on distal end of incision  Disposition: 01-Home or Self Care  Discharge Instructions    Amb Referral to Cardiac Rehabilitation    Complete by:  As directed    Diagnosis:  CABG   CABG X ___:  3   Discharge patient    Complete by:  As directed    Discharge disposition:  01-Home or Self Care   Discharge patient date:  12/31/2016     Allergies as of 12/31/2016      Reactions   No Known Allergies       Medication List    STOP taking these medications   amLODipine 10 MG  tablet Commonly known as:  NORVASC   Fish Oil 1200 MG Caps   nitroGLYCERIN 0.4 MG SL tablet Commonly known as:  NITROSTAT     TAKE these medications   acetaminophen 500 MG tablet Commonly known as:  TYLENOL Take 1,000 mg by mouth 2 (two) times daily.   aspirin 325 MG EC tablet Take 1 tablet (325 mg total) by mouth daily. What changed:  medication strength  how much to take   lisinopril 20 MG tablet Commonly known as:  PRINIVIL,ZESTRIL Take 1 tablet (20 mg total) by mouth daily. What changed:  See the new instructions.   LORazepam 0.5 MG tablet Commonly known as:  ATIVAN Take 0.5 mg by mouth daily as needed for anxiety.   nebivolol 10 MG tablet Commonly known as:  BYSTOLIC Take 1 tablet (10 mg total) by mouth daily.   omeprazole 20 MG capsule Commonly known as:  PRILOSEC Take 20 mg by mouth daily as needed.   oxyCODONE 5 MG immediate release tablet Commonly known as:  Oxy IR/ROXICODONE  Take 1 tablet (5 mg total) by mouth every 4 (four) hours as needed for severe pain.   rosuvastatin 20 MG tablet Commonly known as:  CRESTOR TAKE ONE TABLET BY MOUTH ONCE DAILY.      Follow-up Information    Dorena Bodo, PA-C. Call in 1 day(s).   Specialty:  Physician Assistant Contact information: [address] [place] [address] [phone]        Delight Ovens, MD Follow up.   Specialty:  Cardiothoracic Surgery Why:  Your appointment is on 02/05/2017 at 11:30am. Please arrive at 11:00am for a chest xray at [facility] which is located on the first floor of our building.  Contact information: [address] [place] [address] [phone]        Jodelle Gross, NP Follow up.   Specialties:  Nurse Practitioner, Radiology, Cardiology Why:  Your appointment is on 01/23/2017 at 2:00pm. Please bring your medication list.  Contact information: [address] [place] [address] [phone]          The patient has  been discharged on:   1.Beta Blocker:  Yes [ x  ]                              No   [   ]                              If No, reason:  2.Ace Inhibitor/ARB: Yes [ x  ]                                     No  [    ]                                     If No, reason:  3.Statin:   Yes [ x  ]                  No  [   ]                  If No, reason:  4.Ecasa:  Yes  [  x ]                  No   [   ]                  If No, reason:   Signed: Sharlene Dory 12/31/2016, 9:23 AM

## 2016-12-29 NOTE — Progress Notes (Signed)
CARDIAC REHAB PHASE I   PRE:  Rate/Rhythm: 83 SR  BP:  Sitting: 128/72        SaO2: 98 2L, 94 RA  MODE:  Ambulation: 670 ft   POST:  Rate/Rhythm: 88 SR  BP:  Sitting: 148/58         SaO2: 93 RA  Pt eager to walk, states he has not walked since his arrival to the floor. Pt ambulated 670 ft on RA, rolling walker, steady gait, tolerated well with no complaints. Pt sats 90-95% on RA during ambulation. Encouraged IS, additional ambulation x2 today. Pt to recliner after walk, call bell within reach. Will follow.   [phone] Joylene GrapesEmily C Lizzette Carbonell, RN, BSN 12/29/2016 9:15 AM

## 2016-12-29 NOTE — Progress Notes (Addendum)
[address]       [place] [id number]             [phone]                            3 Days Post-Op Procedure(s) (LRB): CORONARY ARTERY BYPASS GRAFTING (CABG) x 3 WITH ENDOSCOPIC HARVESTING OF RIGHT SAPHENOUS VEIN (N/A) TRANSESOPHAGEAL ECHOCARDIOGRAM (TEE) (N/A) Subjective: Feels okay this morning. He is sweaty this morning which is usual for him per patient.   Objective: Vital signs in last 24 hours: Temp:  [98.6 F (37 C)-99.1 F (37.3 C)] 98.7 F (37.1 C) (05/14 0606) Pulse Rate:  [63-80] 73 (05/14 0606) Cardiac Rhythm: Normal sinus rhythm;Bundle branch block (05/13 1900) Resp:  [14-24] 18 (05/14 0606) BP: (115-145)/(53-73) 133/58 (05/14 0606) SpO2:  [91 %-96 %] 95 % (05/14 0606) Weight:  [85.7 kg (189 lb)] 85.7 kg (189 lb) (05/14 0606)   Intake/Output from previous day: 05/13 0701 - 05/14 0700 In: 336 [P.O.:240; I.V.:46; IV Piggyback:50] Out: 1415 [Urine:1150; Chest Tube:265] Intake/Output this shift: No intake/output data recorded.  General appearance: alert, cooperative and no distress Heart: regular rate and rhythm, S1, S2 normal, no murmur, click, rub or gallop Lungs: clear to auscultation bilaterally Abdomen: soft, non-tender; bowel sounds normal; no masses,  no organomegaly Extremities: extremities normal, atraumatic, no cyanosis or edema Wound: bottom 1/3 is bloody. The rest is clean and dry  Lab Results:  Recent Labs (last 2 labs)    Recent Labs  12/28/16 0321 12/29/16 0541  WBC 19.7* 13.4*  HGB 12.5* 12.2*  HCT 37.1* 36.7*  PLT 156 153     BMET:  Recent Labs (last 2 labs)    Recent Labs  12/28/16 0321 12/29/16 0541  NA 133* 138  K 4.5 3.9  CL 98* 100*  CO2 28 28  GLUCOSE 135* 115*  BUN 14 11  CREATININE 0.91 0.83  CALCIUM 8.7* 8.2*      PT/INR:  Recent Labs (last 2 labs)    Recent Labs  12/26/16 1359  LABPROT 16.8*  INR 1.35     ABG Labs (Brief)          Component Value  Date/Time   PHART 7.350 12/26/2016 1944   HCO3 23.3 12/26/2016 1944   TCO2 29 12/27/2016 1629   ACIDBASEDEF 2.0 12/26/2016 1944   O2SAT 88.0 12/26/2016 1944     CBG (last 3)   Recent Labs (last 2 labs)    Recent Labs  12/28/16 1643 12/28/16 2100 12/29/16 0655  GLUCAP 97 131* 109*      Assessment/Plan: S/P Procedure(s) (LRB): CORONARY ARTERY BYPASS GRAFTING (CABG) x 3 WITH ENDOSCOPIC HARVESTING OF RIGHT SAPHENOUS VEIN (N/A) TRANSESOPHAGEAL ECHOCARDIOGRAM (TEE) (N/A)  1. CV-NSR in the 70s. Good blood pressure control. Inverted T waves on EKG. Will order a 12 lead.     2. Pulm-tolerated 2L [place]. Work on incentive spirometry  and pulm toilet. CXR shows: No pneumothorax. Bibasilar atelectasis with small pleural effusions bilaterally. No new opacity. Stable cardiac prominence. There is aortic atherosclerosis. 3. Renal-creatinine 0.83, electrolytes okay. Lasix 40mg  daily. Weight cont to trend down. 4. Endo-blood glucose level well controlled 5. Incision-redressed with gauze and watch bloody drainage. Continue wires until 12 lead is done.   Plan: Continue ambulation. Encourage IS and flutter valve to wean oxygen requirements. Continue bowel regimen. Continue diuretics for fluid overload. Watch bloody drainage from sternal incision.  LOS: 3 days    Sharlene Dory 12/29/2016 Patient examined  and sternal dressing change personally performed - not purulent Cont dressing changes as needed patient examined and medical record reviewed,agree with above note. Kathlee Nations Trigt III 12/29/2016

## 2016-12-29 NOTE — Discharge Instructions (Signed)
Coronary Artery Bypass Grafting, Care After ° °This sheet gives you information about how to care for yourself after your procedure. Your health care provider may also give you more specific instructions. If you have problems or questions, contact your health care provider. °What can I expect after the procedure? °After the procedure, it is common to have: °· Nausea and a lack of appetite. °· Constipation. °· Weakness and fatigue. °· Depression or irritability. °· Pain or discomfort in your incision areas. °Follow these instructions at home: °Medicines  °· Take over-the-counter and prescription medicines only as told by your health care provider. Do not stop taking medicines or start any new medicines without approval from your health care provider. °· If you were prescribed an antibiotic medicine, take it as told by your health care provider. Do not stop taking the antibiotic even if you start to feel better. °· Do not drive or use heavy machinery while taking prescription pain medicine. °Incision care  °· Follow instructions from your health care provider about how to take care of your incisions. Make sure you: °¨ Wash your hands with soap and water before you change your bandage (dressing). If soap and water are not available, use hand sanitizer. °¨ Change your dressing as told by your health care provider. °¨ Leave stitches (sutures), skin glue, or adhesive strips in place. These skin closures may need to stay in place for 2 weeks or longer. If adhesive strip edges start to loosen and curl up, you may trim the loose edges. Do not remove adhesive strips completely unless your health care provider tells you to do that. °· Keep incision areas clean, dry, and protected. °· Check your incision areas every day for signs of infection. Check for: °¨ More redness, swelling, or pain. °¨ More fluid or blood. °¨ Warmth. °¨ Pus or a bad smell. °· If incisions were made in your legs: °¨ Avoid crossing your legs. °¨ Avoid  sitting for long periods of time. Change positions every 30 minutes. °¨ Raise (elevate) your legs when you are sitting. °Bathing  °· Do not take baths, swim, or use a hot tub until your health care provider approves. °· Only take sponge baths. Pat the incisions dry. Do not rub incisions with a washcloth or towel. °· Ask your health care provider when you can shower. °Eating and drinking  °· Eat foods that are high in fiber, such as raw fruits and vegetables, whole grains, beans, and nuts. Meats should be lean cut. Avoid canned, processed, and fried foods. This can help prevent constipation and is a recommended part of a heart-healthy diet. °· Drink enough fluid to keep your urine clear or pale yellow. °· Limit alcohol intake to no more than 1 drink a day for nonpregnant women and 2 drinks a day for men. One drink equals 12 oz of beer, 5 oz of wine, or 1½ oz of hard liquor. °Activity  °· Rest and limit your activity as told by your health care provider. You may be instructed to: °¨ Stop any activity right away if you have chest pain, shortness of breath, irregular heartbeats, or dizziness. Get help right away if you have any of these symptoms. °¨ Move around frequently for short periods or take short walks as directed by your health care provider. Gradually increase your activities. You may need physical therapy or cardiac rehabilitation to help strengthen your muscles and build your endurance. °¨ Avoid lifting, pushing, or pulling anything that is heavier than 10   lb (4.5 kg) for at least 6 weeks or as told by your health care provider. °· Do not drive until your health care provider approves. °· Ask your health care provider when you may return to work. °· Ask your health care provider when you may resume sexual activity. °General instructions  °· Do not use any products that contain nicotine or tobacco, such as cigarettes and e-cigarettes. If you need help quitting, ask your health care provider. °· Take 2-3 deep  breaths every few hours during the day, while you recover. This helps expand your lungs and prevent complications like pneumonia after surgery. °· If you were given a device called an incentive spirometer, use it several times a day to practice deep breathing. Support your chest with a pillow or your arms when you take deep breaths or cough. °· Wear compression stockings as told by your health care provider. These stockings help to prevent blood clots and reduce swelling in your legs. °· Weigh yourself every day. This helps identify if your body is holding (retaining) fluid that may make your heart and lungs work harder. °· Keep all follow-up visits as told by your health care provider. This is important. °Contact a health care provider if: °· You have more redness, swelling, or pain around any incision. °· You have more fluid or blood coming from any incision. °· Any incision feels warm to the touch. °· You have pus or a bad smell coming from any incision °· You have a fever. °· You have swelling in your ankles or legs. °· You have pain in your legs. °· You gain 2 lb (0.9 kg) or more a day. °· You are nauseous or you vomit. °· You have diarrhea. °Get help right away if: °· You have chest pain that spreads to your jaw or arms. °· You are short of breath. °· You have a fast or irregular heartbeat. °· You notice a "clicking" in your breastbone (sternum) when you move. °· You have numbness or weakness in your arms or legs. °· You feel dizzy or light-headed. °Summary °· After the procedure, it is common to have pain or discomfort in the incision areas. °· Do not take baths, swim, or use a hot tub until your health care provider approves. °· Gradually increase your activities. You may need physical therapy or cardiac rehabilitation to help strengthen your muscles and build your endurance. °· Weigh yourself every day. This helps identify if your body is holding (retaining) fluid that may make your heart and lungs work  harder. °This information is not intended to replace advice given to you by your health care provider. Make sure you discuss any questions you have with your health care provider. °Document Released: 02/21/2005 Document Revised: 06/23/2016 Document Reviewed: 06/23/2016 °Elsevier Interactive Patient Education © 2017 [employer] ° °

## 2016-12-30 LAB — CBC
HCT: 32.9 % — ABNORMAL LOW (ref 39.0–52.0)
Hemoglobin: 10.8 g/dL — ABNORMAL LOW (ref 13.0–17.0)
MCH: 32.5 pg (ref 26.0–34.0)
MCHC: 32.8 g/dL (ref 30.0–36.0)
MCV: 99.1 fL (ref 78.0–100.0)
Platelets: 188 10*3/uL (ref 150–400)
RBC: 3.32 MIL/uL — ABNORMAL LOW (ref 4.22–5.81)
RDW: 12.3 % (ref 11.5–15.5)
WBC: 9.5 10*3/uL (ref 4.0–10.5)

## 2016-12-30 LAB — GLUCOSE, CAPILLARY
Glucose-Capillary: 194 mg/dL — ABNORMAL HIGH (ref 65–99)
Glucose-Capillary: 101 mg/dL — ABNORMAL HIGH (ref 65–99)
Glucose-Capillary: 102 mg/dL — ABNORMAL HIGH (ref 65–99)
Glucose-Capillary: 122 mg/dL — ABNORMAL HIGH (ref 65–99)

## 2016-12-30 LAB — BASIC METABOLIC PANEL
Anion gap: 8 (ref 5–15)
BUN: 16 mg/dL (ref 6–20)
CO2: 29 mmol/L (ref 22–32)
Calcium: 8.4 mg/dL — ABNORMAL LOW (ref 8.9–10.3)
Chloride: 100 mmol/L — ABNORMAL LOW (ref 101–111)
Creatinine, Ser: 0.94 mg/dL (ref 0.61–1.24)
GFR calc Af Amer: >60 mL/min (ref >60)
GFR calc non Af Amer: >60 mL/min (ref >60)
Glucose, Bld: 113 mg/dL — ABNORMAL HIGH (ref 65–99)
Potassium: 3.7 mmol/L (ref 3.5–5.1)
Sodium: 137 mmol/L (ref 135–145)

## 2016-12-30 NOTE — Progress Notes (Signed)
Removed epicardial pacing wires per MD order. Patient tolerated well. 2 sutures removed. Steri strips applied. Sites cleansed.Midsternal dressing changed. Incision drainage serosanguinous fluid.  Bedrest for 1 hour. Vitals stable. Bed rest end at 1130  Minerva Endsiffany N Saharsh Sterling RN

## 2016-12-30 NOTE — Progress Notes (Signed)
CARDIAC REHAB PHASE I   Pt ambulating independently throughout the day, no complaints, declines additional ambulation with cardiac rehab at this time. Cardiac surgery discharge education completed with pt and wife at bedside. Reviewed risk factors, tobacco cessation, IS, sternal precautions, activity progression, exercise,  heart healthy diet, carb counting, daily weights, and phase 2 cardiac rehab. Pt verbalized understanding. Pt agrees to phase 2 cardiac rehab referral, will send to [facility] per pt request. Pt in recliner, call bell within reach.  [phone] Joylene GrapesEmily C Rorey Hodges, RN, BSN 12/30/2016 1:23 PM

## 2016-12-30 NOTE — Progress Notes (Signed)
4 Days Post-Op Procedure(s) (LRB): CORONARY ARTERY BYPASS GRAFTING (CABG) x 3 WITH ENDOSCOPIC HARVESTING OF RIGHT SAPHENOUS VEIN (N/A) TRANSESOPHAGEAL ECHOCARDIOGRAM (TEE) (N/A) Subjective: Feels pretty good this morning. Had a bad night with nursing where he didn't get his medication in time. Shares he walked several times yesterday.   Objective: Vital signs in last 24 hours: Temp:  [98.2 F (36.8 C)-99.3 F (37.4 C)] 98.8 F (37.1 C) (05/15 0330) Pulse Rate:  [71-77] 77 (05/15 0330) Cardiac Rhythm: Normal sinus rhythm;Bundle branch block (05/14 2000) Resp:  [18] 18 (05/15 0330) BP: (105-143)/(55-82) 143/82 (05/15 0330) SpO2:  [90 %-93 %] 93 % (05/15 0330) Weight:  [84.8 kg (186 lb 14.4 oz)] 84.8 kg (186 lb 14.4 oz) (05/15 0330)    Intake/Output from previous day: 05/14 0701 - 05/15 0700 In: 240 [P.O.:240] Out: -  Intake/Output this shift: No intake/output data recorded.  General appearance: alert, cooperative and no distress Heart: regular rate and rhythm, S1, S2 normal, no murmur, click, rub or gallop Lungs: clear to auscultation bilaterally Abdomen: soft, non-tender; bowel sounds normal; no masses,  no organomegaly Extremities: extremities normal, atraumatic, no cyanosis or edema Wound: less bloody drainage from the distal portion of the bloody drainage  Lab Results:  Recent Labs  12/29/16 0541 12/30/16 0242  WBC 13.4* 9.5  HGB 12.2* 10.8*  HCT 36.7* 32.9*  PLT 153 188   BMET:  Recent Labs  12/29/16 0541 12/30/16 0242  NA 138 137  K 3.9 3.7  CL 100* 100*  CO2 28 29  GLUCOSE 115* 113*  BUN 11 16  CREATININE 0.83 0.94  CALCIUM 8.2* 8.4*    PT/INR: No results for input(s): LABPROT, INR in the last 72 hours. ABG    Component Value Date/Time   PHART 7.350 12/26/2016 1944   HCO3 23.3 12/26/2016 1944   TCO2 29 12/27/2016 1629   ACIDBASEDEF 2.0 12/26/2016 1944   O2SAT 88.0 12/26/2016 1944   CBG (last 3)   Recent Labs  12/29/16 1617 12/29/16 2109  12/30/16 0618  GLUCAP 114* 120* 194*    Assessment/Plan: S/P Procedure(s) (LRB): CORONARY ARTERY BYPASS GRAFTING (CABG) x 3 WITH ENDOSCOPIC HARVESTING OF RIGHT SAPHENOUS VEIN (N/A) TRANSESOPHAGEAL ECHOCARDIOGRAM (TEE) (N/A)  1. CV-NSR in the 70s. Will discontinue EPW. Good blood pressure control. On ACEI and BB 2. Pulm-tolerating room air. Work on incentive    spirometry and pulm toilet. CXR shows: No pneumothorax. Bibasilar atelectasis with small pleural effusions bilaterally. No new opacity. Stable cardiac prominence. 3. Renal-creatinine 0.83, electrolytes okay. Lasix 40mg  daily. Weight cont to trend down. 4. Endo-blood glucose level well controlled 5. Incision-gauze less bloody this morning.Continue daily changes Plan: Continue ambulation. Encourage IS and flutter valve. Continue bowel regimen. Continue diuretics for fluid overload. Watch bloody drainage from sternal incision. Discontinue EPW. Home tomorrow if remains stable.    LOS: 4 days    Sharlene Doryessa N Rasheen Schewe 12/30/2016

## 2016-12-31 LAB — GLUCOSE, CAPILLARY: Glucose-Capillary: 116 mg/dL — ABNORMAL HIGH (ref 65–99)

## 2016-12-31 MED ORDER — LISINOPRIL 20 MG PO TABS: 20.0000 mg | ORAL_TABLET | Freq: Every day | ORAL | 1 refills | Status: DC

## 2016-12-31 MED ORDER — OXYCODONE HCL 5 MG PO TABS: 5.0000 mg | ORAL_TABLET | ORAL | 0 refills | Status: DC | PRN

## 2016-12-31 MED ORDER — ASPIRIN 325 MG PO TBEC: 325.0000 mg | DELAYED_RELEASE_TABLET | Freq: Every day | ORAL | 0 refills | Status: DC

## 2016-12-31 NOTE — Care Management Note (Signed)
Case Management Note Donn PieriniKristi Sonal Dorwart RN, BSN Unit 2W-Case Manager [phone]  Patient Details  Name: Loa SocksDale W Mcelhinny MRN: [id number] Date of Birth: [birth year]  Subjective/Objective: Pt admitted s/p CABGx3 on 5/11                   Action/Plan: PTA pt lived at home with wife- plan to return home with wife- no CM needs noted for discharge.  Expected Discharge Date:  12/31/16               Expected Discharge Plan:  Home/Self Care  In-House Referral:     Discharge planning Services  CM Consult  Post Acute Care Choice:  NA Choice offered to:  NA  DME Arranged:    DME Agency:     HH Arranged:    HH Agency:     Status of Service:  Completed, signed off  If discussed at Long Length of Stay Meetings, dates discussed:    Discharge Disposition: home/self care   Additional Comments:  Darrold SpanWebster, Devonta Blanford Hall, RN 12/31/2016, 10:01 AM

## 2016-12-31 NOTE — Progress Notes (Signed)
Loa Socksale W Vancott to be D/C'd Home per MD order. Discussed with the patient and all questions fully answered.    VVS, Skin clean, dry and intact without evidence of skin break down, no evidence of skin tears noted.  IV catheter discontinued intact. Site without signs and symptoms of complications. Dressing and pressure applied.  An After Visit Summary was printed and given to the patient.  Patient escorted via WC, and D/C home via private auto.  Kai LevinsJacobs, Aleaya Latona N  12/31/2016 10:46 AM

## 2016-12-31 NOTE — Progress Notes (Signed)
      [address]       [place] [id number]             [phone]      5 Days Post-Op Procedure(s) (LRB): CORONARY ARTERY BYPASS GRAFTING (CABG) x 3 WITH ENDOSCOPIC HARVESTING OF RIGHT SAPHENOUS VEIN (N/A) TRANSESOPHAGEAL ECHOCARDIOGRAM (TEE) (N/A) Subjective: Unhappy with his care overnight.  Objective: Vital signs in last 24 hours: Temp:  [98.2 F (36.8 C)-99.3 F (37.4 C)] 99.3 F (37.4 C) (05/16 0542) Pulse Rate:  [68-76] 75 (05/16 0555) Cardiac Rhythm: Normal sinus rhythm;Bundle branch block (05/16 0700) Resp:  [18-20] 20 (05/16 0555) BP: (119-162)/(58-74) 136/72 (05/16 0555) SpO2:  [93 %-95 %] 93 % (05/16 0542) Weight:  [84.6 kg (186 lb 9.6 oz)] 84.6 kg (186 lb 9.6 oz) (05/16 0431)  Hemodynamic parameters for last 24 hours:    Intake/Output from previous day: No intake/output data recorded. Intake/Output this shift: No intake/output data recorded.  General appearance: alert, cooperative and no distress Heart: regular rate and rhythm, S1, S2 normal, 3/6 systolicmurmur, click, rub or gallop Lungs: clear to auscultation bilaterally Abdomen: soft, non-tender; bowel sounds normal; no masses,  no organomegaly Extremities: extremities normal, atraumatic, no cyanosis or edema Wound: small amount of bloody drainage on distal end of incision  Lab Results:  Recent Labs  12/29/16 0541 12/30/16 0242  WBC 13.4* 9.5  HGB 12.2* 10.8*  HCT 36.7* 32.9*  PLT 153 188   BMET:  Recent Labs  12/29/16 0541 12/30/16 0242  NA 138 137  K 3.9 3.7  CL 100* 100*  CO2 28 29  GLUCOSE 115* 113*  BUN 11 16  CREATININE 0.83 0.94  CALCIUM 8.2* 8.4*    PT/INR: No results for input(s): LABPROT, INR in the last 72 hours. ABG    Component Value Date/Time   PHART 7.350 12/26/2016 1944   HCO3 23.3 12/26/2016 1944   TCO2 29 12/27/2016 1629   ACIDBASEDEF 2.0 12/26/2016 1944   O2SAT 88.0 12/26/2016 1944   CBG (last 3)   Recent Labs  12/30/16 1634  12/30/16 2107 12/31/16 0630  GLUCAP 102* 122* 116*    Assessment/Plan: S/P Procedure(s) (LRB): CORONARY ARTERY BYPASS GRAFTING (CABG) x 3 WITH ENDOSCOPIC HARVESTING OF RIGHT SAPHENOUS VEIN (N/A) TRANSESOPHAGEAL ECHOCARDIOGRAM (TEE) (N/A)  1. CV-NSR in the 70s, stable BBB. EPW out. Good blood pressure control. On ACEI and BB.  2. Pulm-tolerating room air. Work on incentive spirometry and pulm toilet.  3. Renal-creatinine 0.83, electrolytes okay. Lasix 40mg  daily. Weight cont to trend down. 4. Endo-blood glucose level well controlled 5. Incision-gauze less bloody this morning.Continue BID changes  Plan: Continue ambulation. Encourage IS and flutter valve. Continue bowel regimen. Weight has stabilized therefore will discontinue lasix on discharge. Sternal drainage has slowed down. Discharge today.     LOS: 5 days    Sharlene Doryessa N Samuel Cook 12/31/2016

## 2017-01-06 NOTE — Op Note (Deleted)
  The note originally documented on this encounter has been moved the the encounter in which it belongs.  

## 2017-01-06 NOTE — Op Note (Signed)
Samuel Cook, Samuel Cook                  ACCOUNT NO.:  [id number]  MEDICAL RECORD NO.:  [id number]  LOCATION:  RAD                           FACILITY:  [facility]  PHYSICIAN:  Sheliah Plane, MD    DATE OF BIRTH:  December 11, 1955  DATE OF PROCEDURE:  12/26/2016 DATE OF DISCHARGE:                              OPERATIVE REPORT   PREOPERATIVE DIAGNOSIS:  Left main coronary artery disease with three- vessel disease and mild aortic stenosis.  POSTOPERATIVE DIAGNOSIS:  Left main coronary artery disease with three- vessel disease and mild aortic stenosis.  SURGICAL PROCEDURE:  Coronary artery bypass grafting x3 with the left internal mammary to the left anterior descending coronary artery, reverse saphenous vein graft to the obtuse marginal coronary artery, reverse saphenous vein graft to the posterior descending coronary artery with right thigh and calf greater saphenous vein endoscopic harvesting.  SURGEON:  Sheliah Plane, MD.  FIRST ASSISTANT:  Jari Favre, [place].  BRIEF HISTORY:  The patient is a 61 year old male, who presented without symptoms of angina or chest pain, he was noted to have a murmur and because of this was referred for Cardiology evaluation.  A stress test was performed, showed lateral ischemia.  The patient also had an echocardiogram suggesting mild-to-moderate aortic stenosis.  Cardiac catheterization was performed by Dr. Verdis Prime, which demonstrated a tubular 80-85% left main obstruction.  In addition, the patient had disease in the obtuse marginal.  The right coronary artery was small and nondominant with the posterior descending arising off the distal circumflex.  Overall, ventricular function was preserved.  At the time of cardiac catheterization, hemodynamic suggested mild aortic stenosis. The patient was seen as an outpatient for consideration of coronary artery bypass grafting and after review of his films, coronary artery bypass grafting was recommended because of his  positive stress test and significant left main obstruction and a left dominant system.  We discussed possibility of aortic valve replacement.  The patient was adamant that he did not wish to have a mechanical valve and nor take Coumadin.  Risks and options of surgery were discussed with him and his wife in detail and he was agreeable and willing to proceed.  DESCRIPTION OF PROCEDURE:  With Swan-Ganz and arterial line monitors in place, the patient underwent general endotracheal anesthesia without incident.  Skin of the chest and legs was prepped with Betadine and draped in usual sterile manner.  Dr. Maple Hudson placed a TEE probe and with findings under separate note, but it appeared that the patient had a trileaflet aortic valve, but with good opening suggestive of mild aortic stenosis.  With this in mind, we decided to proceed with coronary artery bypass grafting only.  Appropriate time-out was performed.  We then proceeded with an endoscopic vein harvesting of the right greater saphenous vein from the thigh and calf.  The vein was of excellent quality and caliber.  Median sternotomy was performed.  Left internal mammary artery was dissected down as a pedicle graft.  The distal artery was divided, had good free flow.  Pericardium was opened.  Overall, ventricular function appeared preserved.  The patient was systemically heparinized.  Ascending aorta was cannulated.  The right  atrium was cannulated.  An aortic root vent cardioplegia needle was introduced into the ascending aorta.  The patient was placed on cardiopulmonary bypass 2.4 L/min/m2.  Sites of anastomosis were inspected and dissected out of the epicardium.  As noted, the patient's distal circumflex gave rise to the posterior descending.  Posterior descending vessel was large enough to bypass.  Aortic crossclamp was applied and 500 mL of cold blood potassium cardioplegia was administered into the aortic root.  The heart was then  elevated and the posterior descending coronary artery was identified, opened, and admitted to 1.5 mm probe.  Using a running 7-0 Prolene, distal anastomosis was performed with second reverse saphenous vein graft.  The heart was then elevated and the first obtuse marginal vessel was identified and opened and admitted a 1.5 mm probe.  Using a running 7-0 Prolene, distal anastomosis was performed.  The very distal circumflex branches as they arose from the AV groove were small and not large enough to bypass.  As noted, the posterior descending arose from the distal circumflex.  We then turned our attention to the left anterior descending coronary artery.  In between the mid and distal third of the vessel, the LAD was opened, admitted to 1.5 mm probe distally.  Using a running 8-0 Prolene, left internal mammary artery was anastomosed to the left anterior descending coronary artery.  With release of the bulldog on the mammary artery, there was appropriate rise in myocardial septal temperature.  The bulldog was placed back on the mammary artery.  Additional cold blood cardioplegia was administered. With crossclamp still in place, 2 punch aortotomies were performed and each of the two vein grafts were anastomosed to the ascending aorta. The heart was allowed to passively fill and de-air the bulldog, was removed from the mammary artery with rise in myocardial septal temperature.  Aortic crossclamp was then removed with total crossclamp time of 69 minutes.  The patient spontaneously converted to a sinus rhythm.  Sites of anastomosis were inspected free of bleeding.  Atrial and ventricular pacing wires were applied.  The patient was then ventilated and weaned from cardiopulmonary bypass without difficulty. He remained hemodynamically stable.  He was decannulated in usual fashion.  Protamine sulfate was administered with operative field hemostatic.  Atrial and ventricular wires as noted had been  applied.  A left pleural tube and a Blake mediastinal drain were left in place. Pericardium was loosely reapproximated.  Sternum was closed with #6 stainless steel wire.  Fascia was closed with interrupted 0 Vicryl, running 3-0 Vicryl in subcutaneous tissue, 3-0 subcuticular stitch in skin edges.  Dry dressings were applied.  Sponge and needle count was reported as correct at completion of the procedure.  Total pump time was 103 minutes.  The patient tolerated the procedure without obvious complication and was transferred to the Surgical Intensive Care Unit for further postoperative care.  He did not require any blood bank blood products during the operative procedure.     Sheliah PlaneEdward Lielle Vandervort, MD     EG/MEDQ  D:  01/05/2017  T:  01/06/2017  Job:  [id number]

## 2017-01-19 NOTE — Addendum Note (Signed)
Addendum  created 01/19/17 1500 by Coe Angelos, MD   Sign clinical note    

## 2017-01-19 NOTE — Addendum Note (Signed)
Addendum  created 01/19/17 1501 by Val EagleMoser, Karolyn Messing, MD   Sign clinical note

## 2017-01-22 ENCOUNTER — Encounter (HOSPITAL_COMMUNITY): Payer: Self-pay

## 2017-01-22 NOTE — Progress Notes (Signed)
Cardiology Office Note   Date:  01/23/2017   ID:  Samuel Cook, DOB [birth year], MRN [id number]  PCP:  [name], [name], PA-C  Cardiologist: Eden EmmsNishan  Chief Complaint  Patient presents with  . Hospitalization Follow-up    CABG  . Hypertension      History of Present Illness: Samuel Cook is a 61 y.o. male who presents for post hospital follow up after admission for abnormal stress test in the setting of chest pain. Cardiac cath on revealed multivessel disease, to include left main to mid LAD. He subsequently underwent CABG on 12/26/2016, (LIMA to LAD, reverse SVG to OM, reverse SVG to PDA, with right thigh endoscopic SVG). Post operative TEE revealed Mild AS.   On discharge, amlodipine, fish oil, and NTG sublingual was stopped. He was sent home on ASA, lisinopril, Bystolic, Crestor, and omeprazole.   He comes today feeling well, but complains of soreness in the lower sternal area. He is walking .5 miles daily. Climbing stairs in his home for exercise, and working in his yard. He has not yet been seen by CVTS. He has run out of anxiety medications. He denies worsening chest pain, dizziness or dyspnea.   Past Medical History:  Diagnosis Date  . Anxiety   . Coronary artery disease   . GERD (gastroesophageal reflux disease)    occasionally  . Heart murmur    dx 1.5 mths ago.  No symptoms  . Hyperlipidemia   . Hypertension     Past Surgical History:  Procedure Laterality Date  . CARDIAC CATHETERIZATION    . CORONARY ARTERY BYPASS GRAFT N/A 12/26/2016   Procedure: CORONARY ARTERY BYPASS GRAFTING (CABG) x 3 WITH ENDOSCOPIC HARVESTING OF RIGHT SAPHENOUS VEIN;  Surgeon: Delight OvensGerhardt, Edward B, MD;  [facility];  Service: Open Heart Surgery;  Laterality: N/A;  . LEFT HEART CATH AND CORONARY ANGIOGRAPHY N/A 12/12/2016   Procedure: Left Heart Cath and Coronary Angiography;  Surgeon: Lyn RecordsHenry W Smith, MD;  [facility];  Service: Cardiovascular;  Laterality: N/A;  . none     none  .  TEE WITHOUT CARDIOVERSION N/A 12/26/2016   Procedure: TRANSESOPHAGEAL ECHOCARDIOGRAM (TEE);  Surgeon: Delight OvensGerhardt, Edward B, MD;  [facility];  Service: Open Heart Surgery;  Laterality: N/A;     Current Outpatient Prescriptions  Medication Sig Dispense Refill  . acetaminophen (TYLENOL) 500 MG tablet Take 1,000 mg by mouth 2 (two) times daily.    Marland Kitchen. aspirin EC 325 MG EC tablet Take 1 tablet (325 mg total) by mouth daily. 30 tablet 0  . lisinopril (PRINIVIL,ZESTRIL) 20 MG tablet Take 1 tablet (20 mg total) by mouth daily. 30 tablet 1  . LORazepam (ATIVAN) 0.5 MG tablet Take 0.5 mg by mouth daily as needed for anxiety.    . nebivolol (BYSTOLIC) 10 MG tablet Take 1 tablet (10 mg total) by mouth daily. 30 tablet 11  . omeprazole (PRILOSEC) 20 MG capsule Take 20 mg by mouth daily as needed.    Marland Kitchen. oxyCODONE (OXY IR/ROXICODONE) 5 MG immediate release tablet Take 1 tablet (5 mg total) by mouth every 4 (four) hours as needed for severe pain. 30 tablet 0  . rosuvastatin (CRESTOR) 20 MG tablet TAKE ONE TABLET BY MOUTH ONCE DAILY. 30 tablet 1   No current facility-administered medications for this visit.     Allergies:   No known allergies    Social History:  The patient  reports that he quit smoking about 23 years ago. He has  a 20.00 pack-year smoking history. His smokeless tobacco use includes Snuff and Chew. He reports that he drinks alcohol. He reports that he uses drugs, including Marijuana and Codeine.   Family History:  The patient's family history includes Birth defects in his maternal aunt; Cancer in his mother; Early death in his mother; Hearing loss in his maternal uncle; Heart disease in his maternal grandmother; Hyperlipidemia in his maternal aunt; Stroke in his father.    ROS: All other systems are reviewed and negative. Unless otherwise mentioned in H&P    PHYSICAL EXAM: VS:  BP (!) 160/100   Pulse 71   Ht 5\' 10"  (1.778 m)   Wt 186 lb 9.6 oz (84.6 kg)   SpO2 97%   BMI 26.77 kg/m   , BMI Body mass index is 26.77 kg/m. GEN: Well nourished, well developed, in no acute distress  HEENT: normal  Neck: no JVD, carotid bruits, or masses Cardiac: RRR; 1/6 systolic murmur, rubs, or gallops,no edema  Respiratory:  clear to auscultation bilaterally, normal work of breathing GI: soft, nontender, nondistended, + BS MS: no deformity or atrophy Well healed sternotomy incision and vein harvest on the right lower extremity.  Some nonpitting edema.  Skin: warm and dry, no rash Neuro:  Strength and sensation are intact Psych: euthymic mood, full affect   Recent Labs: 11/28/2016: TSH 0.46 12/25/2016: ALT 43 12/27/2016: Magnesium 2.1 12/30/2016: BUN 16; Creatinine, Ser 0.94; Hemoglobin 10.8; Platelets 188; Potassium 3.7; Sodium 137    Lipid Panel    Component Value Date/Time   CHOL 135 11/28/2016 0844   TRIG 61 11/28/2016 0844   HDL 50 11/28/2016 0844   CHOLHDL 2.7 11/28/2016 0844   VLDL 12 11/28/2016 0844   LDLCALC 73 11/28/2016 0844      Wt Readings from Last 3 Encounters:  01/23/17 186 lb 9.6 oz (84.6 kg)  12/31/16 186 lb 9.6 oz (84.6 kg)  12/25/16 185 lb 11.2 oz (84.2 kg)      Other studies Reviewed: Conclusion    Heavy coronary calcification throughout the left main and proximal to mid LAD.  Tubular 55-65% distal left main  Ostial 75% LAD  Ostial 90% first obtuse marginal  Nondominant right coronary with ostial 40-50% narrowing  Mild aortic stenosis with peak to peak gradient of 20 mmHg. Heavy calcification on echocardiography.  Normal LV systolic function with mildly elevated end-diastolic pressure consistent with diastolic heart failure.     ASSESSMENT AND PLAN:  1. CAD: S/P CABG. He is overexerting himself a little too soon. He is walking and climbing stairs and has become sore in lower sternal area. He states that he does pretty much what he wants to do now that he feels better. I have asked him to temporarily decrease the exertion, and not over  do concerning walking up and down stairs for exercise. He is to not lift anything over 10-15 lbs until he is finished with cardiac rehab.   I have discussed cardiac rehab with he and his wife, and the patient is willing to proceed with this. A referral will be made. He is not to drive until released by CVTS. Continue statin, and ASA.   2. Hypertension: Blood pressure is elevated. Patient states that it has been running at that level since being home. He in on lisinopril 20 mg daily and nebivolol 10 mg daily. I will increase lisinopril to 20 mg BID. I have given him a BP recording sheet. He is to write down BP at home. Range  between 120/60-130/75 is recommended. Will see him back in one month to evaluate his response to medications. He sees CVTS in two weeks. They will also assess his response to medications.   3. Anxiety: Will follow with PCP for medications.    Current medicines are reviewed at length with the patient today.    Labs/ tests ordered today include:  Bettey Mare. Liborio Nixon, ANP, AACC   01/23/2017 2:22 PM    [facility] [address], [place], [place] [address] Phone: [phone]; Fax: [phone]

## 2017-01-23 ENCOUNTER — Ambulatory Visit (INDEPENDENT_AMBULATORY_CARE_PROVIDER_SITE_OTHER): Payer: 59 | Admitting: Adult Health

## 2017-01-23 ENCOUNTER — Encounter: Payer: Self-pay | Admitting: Adult Health

## 2017-01-23 VITALS — BP 160/100 | HR 71 | Ht 70.0 in | Wt 186.6 lb

## 2017-01-23 DIAGNOSIS — I10 Essential (primary) hypertension: Secondary | ICD-10-CM | POA: Diagnosis not present

## 2017-01-23 DIAGNOSIS — Z951 Presence of aortocoronary bypass graft: Secondary | ICD-10-CM

## 2017-01-23 DIAGNOSIS — I2510 Atherosclerotic heart disease of native coronary artery without angina pectoris: Secondary | ICD-10-CM

## 2017-01-23 DIAGNOSIS — Z79899 Other long term (current) drug therapy: Secondary | ICD-10-CM

## 2017-01-23 MED ORDER — LISINOPRIL 20 MG PO TABS: 20.0000 mg | ORAL_TABLET | Freq: Two times a day (BID) | ORAL | 1 refills | Status: DC

## 2017-01-23 NOTE — Patient Instructions (Signed)
Medication Instructions:  INCREASE LISINOPRIL TO 20 MG TWO TIMES DAILY   Labwork: Your physician recommends that you return for lab work in: 1 MONTH(JUST BEFORE RETURN VISIT)   Testing/Procedures: NONE  Follow-Up: Your physician recommends that you schedule a follow-up appointment in: 1 MONTH    Any Other Special Instructions Will Be Listed Below (If Applicable).  PLEASE KEEP A BLOOD PRESSURE LOG AND BRING IT WITH YOU TO YOUR FOLLOW UP APPOINTMENT  You have been referred to CARDIAC REHAB, SOMEONE FROM THERE WILL CONTACT YOU SOON.     If you need a refill on your cardiac medications before your next appointment, please call your pharmacy.

## 2017-01-30 ENCOUNTER — Other Ambulatory Visit: Payer: Self-pay

## 2017-01-30 DIAGNOSIS — Z0000 Encounter for general adult medical examination without abnormal findings: Secondary | ICD-10-CM

## 2017-01-30 DIAGNOSIS — I10 Essential (primary) hypertension: Secondary | ICD-10-CM

## 2017-02-02 ENCOUNTER — Other Ambulatory Visit: Payer: 59

## 2017-02-02 DIAGNOSIS — Z0000 Encounter for general adult medical examination without abnormal findings: Secondary | ICD-10-CM

## 2017-02-02 LAB — LIPID PANEL
Cholesterol: 114 mg/dL (ref <200)
HDL: 39 mg/dL — AB (ref >40)
LDL Cholesterol: 56 mg/dL (ref <100)
TRIGLYCERIDES: 95 mg/dL (ref <150)
Total CHOL/HDL Ratio: 2.9 Ratio (ref <5.0)
VLDL: 19 mg/dL (ref <30)

## 2017-02-02 LAB — CBC WITH DIFFERENTIAL/PLATELET
BASOS PCT: 0 %
Basophils Absolute: 0 cells/uL (ref 0–200)
EOS ABS: 228 {cells}/uL (ref 15–500)
Eosinophils Relative: 2 %
HEMATOCRIT: 46 % (ref 38.5–50.0)
Hemoglobin: 14.7 g/dL (ref 13.0–17.0)
Lymphocytes Relative: 20 %
Lymphs Abs: 2280 cells/uL (ref 850–3900)
MCH: 30.4 pg (ref 27.0–33.0)
MCHC: 32 g/dL (ref 32.0–36.0)
MCV: 95.2 fL (ref 80.0–100.0)
MONO ABS: 912 {cells}/uL (ref 200–950)
MPV: 9.2 fL (ref 7.5–12.5)
Monocytes Relative: 8 %
NEUTROS ABS: 7980 {cells}/uL — AB (ref 1500–7800)
Neutrophils Relative %: 70 %
Platelets: 357 10*3/uL (ref 140–400)
RBC: 4.83 MIL/uL (ref 4.20–5.80)
RDW: 13.6 % (ref 11.0–15.0)
WBC: 11.4 10*3/uL — ABNORMAL HIGH (ref 3.8–10.8)

## 2017-02-02 LAB — COMPREHENSIVE METABOLIC PANEL WITH GFR
ALT: 18 U/L (ref 9–46)
AST: 16 U/L (ref 10–35)
Albumin: 4 g/dL (ref 3.6–5.1)
Alkaline Phosphatase: 69 U/L (ref 40–115)
BUN: 18 mg/dL (ref 7–25)
CO2: 24 mmol/L (ref 20–31)
Calcium: 9.2 mg/dL (ref 8.6–10.3)
Chloride: 102 mmol/L (ref 98–110)
Creat: 0.97 mg/dL (ref 0.70–1.25)
Glucose, Bld: 88 mg/dL (ref 70–99)
Potassium: 4.8 mmol/L (ref 3.5–5.3)
Sodium: 139 mmol/L (ref 135–146)
Total Bilirubin: 0.6 mg/dL (ref 0.2–1.2)
Total Protein: 7.1 g/dL (ref 6.1–8.1)

## 2017-02-02 LAB — TSH: TSH: 0.74 m[IU]/L (ref 0.40–4.50)

## 2017-02-03 ENCOUNTER — Telehealth: Payer: Self-pay | Admitting: Adult Health

## 2017-02-03 LAB — PSA: PSA: 0.9 ng/mL (ref <=4.0)

## 2017-02-03 MED ORDER — LISINOPRIL 20 MG PO TABS: 20.0000 mg | ORAL_TABLET | Freq: Two times a day (BID) | ORAL | 11 refills | Status: DC

## 2017-02-03 NOTE — Telephone Encounter (Signed)
[facility] for his lisinopril (PRINIVIL,ZESTRIL) 20 MG tablet [[id number]][[id number]]  Since there have been changes

## 2017-02-04 ENCOUNTER — Encounter: Payer: Self-pay | Admitting: Physician Assistant

## 2017-02-04 ENCOUNTER — Ambulatory Visit (INDEPENDENT_AMBULATORY_CARE_PROVIDER_SITE_OTHER): Payer: 59 | Admitting: Physician Assistant

## 2017-02-04 VITALS — BP 154/94 | HR 71 | Temp 97.7°F | Resp 18 | Ht 70.0 in | Wt 184.0 lb

## 2017-02-04 DIAGNOSIS — F411 Generalized anxiety disorder: Secondary | ICD-10-CM | POA: Diagnosis not present

## 2017-02-04 DIAGNOSIS — I350 Nonrheumatic aortic (valve) stenosis: Secondary | ICD-10-CM | POA: Diagnosis not present

## 2017-02-04 DIAGNOSIS — Z951 Presence of aortocoronary bypass graft: Secondary | ICD-10-CM

## 2017-02-04 DIAGNOSIS — Z09 Encounter for follow-up examination after completed treatment for conditions other than malignant neoplasm: Secondary | ICD-10-CM

## 2017-02-04 DIAGNOSIS — R739 Hyperglycemia, unspecified: Secondary | ICD-10-CM

## 2017-02-04 DIAGNOSIS — F419 Anxiety disorder, unspecified: Secondary | ICD-10-CM | POA: Insufficient documentation

## 2017-02-04 DIAGNOSIS — I10 Essential (primary) hypertension: Secondary | ICD-10-CM

## 2017-02-04 DIAGNOSIS — Z0000 Encounter for general adult medical examination without abnormal findings: Secondary | ICD-10-CM | POA: Diagnosis not present

## 2017-02-04 DIAGNOSIS — E785 Hyperlipidemia, unspecified: Secondary | ICD-10-CM

## 2017-02-04 DIAGNOSIS — Z23 Encounter for immunization: Secondary | ICD-10-CM

## 2017-02-04 MED ORDER — LORAZEPAM 0.5 MG PO TABS: 0.5000 mg | ORAL_TABLET | Freq: Two times a day (BID) | ORAL | 1 refills | Status: DC | PRN

## 2017-02-04 NOTE — Addendum Note (Signed)
Addended by: Phineas SemenJOHNSON, Terrel Manalo A on: 02/04/2017 05:13 PM   Modules accepted: Orders

## 2017-02-04 NOTE — Progress Notes (Addendum)
Patient ID: Samuel Cook MRN: [id number], DOB: 09/19/1955 61 y.o. Date of Encounter: 02/04/2017, 9:04 AM    Chief Complaint: Physical (CPE)  HPI: 61 y.o. y/o male here for CPE.   Today I have reviewed his hospital discharge summary. Hospitalized 12/26/16 35/16/18. He had CABG 3 by Dr. Tyrone Sage. 12/26/16. After reviewing the hospital discharge summary--this indicates that he had no postoperative complications at all. Discharge summary does state to stop amlodipine and fish oil. Was discharged home with aspirin, lisinopril 20 mg daily, lorazepam 0.5 as needed, Bystolic 10 mg daily, omeprazole 20 mg daily when necessary, Crestor 20 mg daily. Also oxycodone and Tylenol for pain.  Prior to his surgery he had a positive stress test and cardiac catheterization showed evidence of left main obstruction. Also noted to have abnormal aortic valve. Was subsequently scheduled for CABG.  Today patient states that since the surgery--- says that at first when he got home he was having difficulty sleeping at night but then during the day all of a sudden would follow sleep for the next thing he would know he be waking up. However says that all of this has improved and pretty much resolved. Says that his blood pressure medicines have been adjusted and that his lisinopril was increased from once a day to twice a day recently and he had some issues with the pharmacy etc. but has now gotten that straightened out and did confirm that he is now taking the appropriate dose. He also states that his anxiety has been increased ever since he's been home from the hospital. Also says that he has been out of his lorazepam and has had none available since the hospitalization. Is feeling much more anxious than usual. Says his next appointment with cardiology as July 9. States that he is scheduled to see Dr. Tyrone Sage tomorrow and they're supposed to discuss therapy and discuss driving again. Says that so far since his  surgery he has not been able to drive.  No other concerns to address today. States that he is scheduled to do preventive care/complete physical exam today.    Addendum added---02/16/2017: I was just cc-ed note from PA with [facility]. Their note included that patient had A1c of 6.3 on 12/25/16. I will make a note of this to follow-up at future visits/lab. Noted that glucose was normal on his recent lab with me and suspect that he has improved his diet since CABG.  However I will discuss low carbohydrate diet at future visit and monitor A1c.   Review of Systems: Consitutional: No fever, chills, fatigue, night sweats, lymphadenopathy, or weight changes. Eyes: No visual changes, eye redness, or discharge. ENT/Mouth: Ears: No otalgia, tinnitus, hearing loss, discharge. Nose: No congestion, rhinorrhea, sinus pain, or epistaxis. Throat: No sore throat, post nasal drip, or teeth pain. Cardiovascular: No CP, palpitations, diaphoresis, DOE, edema, orthopnea, PND. Respiratory: No cough, hemoptysis, SOB, or wheezing. Gastrointestinal: No anorexia, dysphagia, reflux, pain, nausea, vomiting, hematemesis, diarrhea, constipation, BRBPR, or melena. Genitourinary: No dysuria, frequency, urgency, hematuria, incontinence, nocturia, decreased urinary stream, discharge, impotence, or testicular pain/masses. Musculoskeletal: No decreased ROM, myalgias, stiffness, joint swelling, or weakness. Skin: No rash, erythema, lesion changes, pain, warmth, jaundice, or pruritis. Neurological: No headache, dizziness, syncope, seizures, tremors, memory loss, coordination problems, or paresthesias. Psychological: No depression, hallucinations, SI/HI. Endocrine: No fatigue, polydipsia, polyphagia, polyuria, or known diabetes. All other systems were reviewed and are otherwise negative.  Past Medical History:  Diagnosis Date  . Anxiety   . Coronary artery  disease   . GERD (gastroesophageal reflux disease)    occasionally  . Heart  murmur    dx 1.5 mths ago.  No symptoms  . Hyperlipidemia   . Hypertension      Past Surgical History:  Procedure Laterality Date  . CARDIAC CATHETERIZATION    . CORONARY ARTERY BYPASS GRAFT N/A 12/26/2016   Procedure: CORONARY ARTERY BYPASS GRAFTING (CABG) x 3 WITH ENDOSCOPIC HARVESTING OF RIGHT SAPHENOUS VEIN;  Surgeon: Delight OvensGerhardt, Edward B, MD;  [facility];  Service: Open Heart Surgery;  Laterality: N/A;  . LEFT HEART CATH AND CORONARY ANGIOGRAPHY N/A 12/12/2016   Procedure: Left Heart Cath and Coronary Angiography;  Surgeon: Lyn RecordsHenry W Smith, MD;  [facility];  Service: Cardiovascular;  Laterality: N/A;  . none     none  . TEE WITHOUT CARDIOVERSION N/A 12/26/2016   Procedure: TRANSESOPHAGEAL ECHOCARDIOGRAM (TEE);  Surgeon: Delight OvensGerhardt, Edward B, MD;  [facility];  Service: Open Heart Surgery;  Laterality: N/A;    Home Meds:  Outpatient Medications Prior to Visit  Medication Sig Dispense Refill  . acetaminophen (TYLENOL) 500 MG tablet Take 1,000 mg by mouth 2 (two) times daily.    Marland Kitchen. aspirin EC 325 MG EC tablet Take 1 tablet (325 mg total) by mouth daily. 30 tablet 0  . lisinopril (PRINIVIL,ZESTRIL) 20 MG tablet Take 1 tablet (20 mg total) by mouth 2 (two) times daily. 30 tablet 11  . nebivolol (BYSTOLIC) 10 MG tablet Take 1 tablet (10 mg total) by mouth daily. 30 tablet 11  . omeprazole (PRILOSEC) 20 MG capsule Take 20 mg by mouth daily as needed.    . rosuvastatin (CRESTOR) 20 MG tablet TAKE ONE TABLET BY MOUTH ONCE DAILY. 30 tablet 1  . LORazepam (ATIVAN) 0.5 MG tablet Take 0.5 mg by mouth daily as needed for anxiety.    Marland Kitchen. oxyCODONE (OXY IR/ROXICODONE) 5 MG immediate release tablet Take 1 tablet (5 mg total) by mouth every 4 (four) hours as needed for severe pain. 30 tablet 0   No facility-administered medications prior to visit.     Allergies:  Allergies  Allergen Reactions  . No Known Allergies     Social History   Social History  . Marital status:  Married    Spouse name: N/A  . Number of children: N/A  . Years of education: N/A   Occupational History  . Not on file.   Social History Main Topics  . Smoking status: Former Smoker    Packs/day: 1.00    Years: 20.00    Quit date: 10/20/1993  . Smokeless tobacco: Current User    Types: Snuff, Chew  . Alcohol use Yes     Comment: rare   . Drug use: Yes    Types: Marijuana, Codeine     Comment: occasionally....takes 1 puff when really stressed  . Sexual activity: Yes   Other Topics Concern  . Not on file   Social History Narrative  . No narrative on file    Family History  Problem Relation Age of Onset  . Cancer Mother   . Early death Mother   . Stroke Father   . Birth defects Maternal Aunt   . Hyperlipidemia Maternal Aunt   . Hearing loss Maternal Uncle   . Heart disease Maternal Grandmother     Physical Exam: Blood pressure (!) 154/94, pulse 71, temperature 97.7 F (36.5 C), temperature source Oral, resp. rate 18, height 5\' 10"  (1.778 m), weight 184 lb (83.5  kg), SpO2 98 %.  General: Well developed, well nourished WM. Appears in no acute distress. HEENT: Normocephalic, atraumatic. Conjunctiva pink, sclera non-icteric. Pupils 2 mm constricting to 1 mm, round, regular, and equally reactive to light and accomodation. EOMI. Internal auditory canal clear. TMs with good cone of light and without pathology. Nasal mucosa pink. Nares are without discharge. No sinus tenderness. Oral mucosa pink.  Neck: Supple. Trachea midline. No thyromegaly. Full ROM. No lymphadenopathy.No carotid bruits. Lungs: Clear to auscultation bilaterally without wheezes, rales, or rhonchi. Breathing is of normal effort and unlabored. Cardiovascular: RRR. I/ VI murmur. Abdomen: Soft, non-tender, non-distended with normoactive bowel sounds. No hepatosplenomegaly or masses. No rebound/guarding. No CVA tenderness. No hernias. Rectal: No external hemorrhoids or fissures. Rectal vault without masses. Prostate  gland firm and smooth. No nodularity, tenderness, mass, or induration.  Musculoskeletal: Full range of motion and 5/5 strength throughout. Skin: Warm and moist without erythema, ecchymosis, or rash.  Neuro: A+Ox3. CN II-XII grossly intact. Moves all extremities spontaneously. Full sensation throughout. Normal gait.  Psych:  Responds to questions appropriately with a normal affect.   Assessment/Plan:  61 y.o. y/o white male here for CPE  1. Encounter for preventive health examination  A. Screening Labs: He came for screening labs on 02/02/17. These results were reviewed today.  CBC looks good. CMET is all normal. Lipid panel is good with triglycerides 95,  HDL is low at 39,  LDL is 56.  PSA is normal. TSH is normal.  B. Screening For Prostate Cancer: PSA is normal.  C. Screening For Colorectal Cancer:  I discussed that colorectal cancer screening as recommended for everyone by age 78. Discussed reasons for this etc. Discussed risk versus benefits. He reports that he has never had a colonoscopy. He reports that he does not want to go through a colonoscopy at all but especially not right now as he is just now trying to recover from his recent CABG. Also discussed other test including Hemoccults and Cologuard.  He states that he has hemorrhoids and he is afraid that that blood from hemorrhoids will cause the test to look positive. States that given his recent heart surgery he does not want to do any of this evaluation right now would maybe consider in the future but definitely wants to wait on this.  D. Immunizations: Flu-----------N/A--June Tetanus---- I have no immunization records. He states that he does not think he has had any type of immunization in quite a while and is agreeable to go ahead and update T dap today. T dap given here 02/04/17. Pneumococcal --- given his cardiac disease and recent surgery should have Pneumovax 23. He is agreeable to receive this today. Pneumovax 23 given  here 02/04/17. Will require no further pneumonia vaccine until age 76. Shingrix---- I discussed this vaccine with him. I reviewed with him and wrote on his AVS for him to call his insurance and find out how much of this they cover him what his cost would be and then let us know.   2. Hospital discharge follow-up  3. Generalized anxiety disorder I explained to him that his body's been through anesthesia and a lot of stress and takes a bile for things to balance back out. Plain that it is okay for him to use the lorazepam more often right now than what he had been doing in the past. In the past he was not even taking one on a daily basis was just using one as needed. Will give him enough  that if he needs to take 1 twice daily for a while he can do so. - LORazepam (ATIVAN) 0.5 MG tablet; Take 1 tablet (0.5 mg total) by mouth 2 (two) times daily as needed for anxiety.  Dispense: 60 tablet; Refill: 1  4. Essential hypertension Blood pressure is elevated today. However I will wait for him to have his follow-up with cardiology and [facility]. He has recently seen cardiology and has follow-up scheduled there and he is seeing Dr. Nydia Bouton tomorrow. Also, I feel that his stress/anxiety are contributing to his elevated blood pressure. Will have him use the lorazepam twice daily/as needed to help control his anxiety.  5. Aortic valve stenosis, etiology of cardiac valve disease unspecified  6. Hyperlipidemia, unspecified hyperlipidemia type At goal. On Crestor.  7. S/P CABG x 3  8. Hyperglyceima: Addendum added---02/16/2017: I was just cc-ed note from PA with [facility]. Their note included that patient had A1c of 6.3 on 12/25/16. I will make a note of this to follow-up at future visits/lab. Noted that glucose was normal on his recent lab with me and suspect that he has improved his diet since CABG.  However I will discuss low carbohydrate diet at future visit and monitor A1c.     Signed:   [address] [place],  [place]  02/04/2017 9:04 AM

## 2017-02-05 ENCOUNTER — Ambulatory Visit: Payer: 59 | Admitting: Cardiothoracic Surgery

## 2017-02-05 ENCOUNTER — Telehealth: Payer: Self-pay

## 2017-02-05 ENCOUNTER — Other Ambulatory Visit: Payer: Self-pay | Admitting: Physician Assistant

## 2017-02-05 NOTE — Telephone Encounter (Signed)
Patient states he is no longer taking omeprazole. I will remove from patient list

## 2017-02-05 NOTE — Telephone Encounter (Signed)
Refill appropriate 

## 2017-02-09 ENCOUNTER — Other Ambulatory Visit: Payer: Self-pay | Admitting: Cardiothoracic Surgery

## 2017-02-09 DIAGNOSIS — Z951 Presence of aortocoronary bypass graft: Secondary | ICD-10-CM

## 2017-02-16 ENCOUNTER — Ambulatory Visit
Admission: RE | Admit: 2017-02-16 | Discharge: 2017-02-16 | Disposition: A | Discharge status: Home / Self Care | Payer: 59 | Source: Ambulatory Visit | Attending: Cardiothoracic Surgery | Admitting: Cardiothoracic Surgery

## 2017-02-16 ENCOUNTER — Encounter: Payer: Self-pay | Admitting: Physician Assistant

## 2017-02-16 ENCOUNTER — Ambulatory Visit (INDEPENDENT_AMBULATORY_CARE_PROVIDER_SITE_OTHER): Payer: Self-pay | Admitting: Physician Assistant

## 2017-02-16 VITALS — BP 165/93 | HR 69 | Resp 16 | Ht 70.0 in | Wt 187.8 lb

## 2017-02-16 DIAGNOSIS — J90 Pleural effusion, not elsewhere classified: Secondary | ICD-10-CM | POA: Diagnosis not present

## 2017-02-16 DIAGNOSIS — R739 Hyperglycemia, unspecified: Secondary | ICD-10-CM | POA: Insufficient documentation

## 2017-02-16 DIAGNOSIS — Z951 Presence of aortocoronary bypass graft: Secondary | ICD-10-CM

## 2017-02-16 DIAGNOSIS — I2510 Atherosclerotic heart disease of native coronary artery without angina pectoris: Secondary | ICD-10-CM

## 2017-02-16 MED ORDER — AMLODIPINE BESYLATE 10 MG PO TABS: 10.0000 mg | ORAL_TABLET | Freq: Every day | ORAL | 1 refills | Status: DC
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
## 2017-02-16 NOTE — Patient Instructions (Signed)
You may return to driving an automobile as long as you are no longer requiring oral narcotic pain relievers during the daytime.  It would be wise to start driving only short distances during the daylight and gradually increase from there as you feel comfortable.  You may continue to gradually increase your physical activity as tolerated.  Refrain from any heavy lifting or strenuous use of your arms and shoulders until at least 8 weeks from the time of your surgery, and avoid activities that cause increased pain in your chest on the side of your surgical incision.  Otherwise, you may continue to increase activities without any particular limitations.  Increase the intensity and duration of physical activity gradually.  You are encouraged to enroll and participate in the outpatient cardiac rehab program beginning as soon as practical.

## 2017-02-19 ENCOUNTER — Other Ambulatory Visit (HOSPITAL_COMMUNITY)
Admission: RE | Admit: 2017-02-19 | Discharge: 2017-02-19 | Disposition: A | Discharge status: Home / Self Care | Payer: 59 | Source: Ambulatory Visit | Attending: Adult Health | Admitting: Adult Health

## 2017-02-19 DIAGNOSIS — Z79899 Other long term (current) drug therapy: Secondary | ICD-10-CM | POA: Diagnosis present

## 2017-02-19 LAB — BASIC METABOLIC PANEL
ANION GAP: 10 (ref 5–15)
BUN: 19 mg/dL (ref 6–20)
CALCIUM: 9.3 mg/dL (ref 8.9–10.3)
CHLORIDE: 99 mmol/L — AB (ref 101–111)
CO2: 27 mmol/L (ref 22–32)
CREATININE: 0.99 mg/dL (ref 0.61–1.24)
GFR calc non Af Amer: >60 mL/min (ref >60)
Glucose, Bld: 109 mg/dL — ABNORMAL HIGH (ref 65–99)
Potassium: 4.2 mmol/L (ref 3.5–5.1)
SODIUM: 136 mmol/L (ref 135–145)

## 2017-02-23 ENCOUNTER — Encounter: Payer: Self-pay | Admitting: Adult Health

## 2017-02-23 ENCOUNTER — Ambulatory Visit (INDEPENDENT_AMBULATORY_CARE_PROVIDER_SITE_OTHER): Payer: 59 | Admitting: Adult Health

## 2017-02-23 VITALS — BP 166/82 | HR 83 | Ht 70.0 in | Wt 185.0 lb

## 2017-02-23 DIAGNOSIS — I2510 Atherosclerotic heart disease of native coronary artery without angina pectoris: Secondary | ICD-10-CM

## 2017-02-23 DIAGNOSIS — I10 Essential (primary) hypertension: Secondary | ICD-10-CM | POA: Diagnosis not present

## 2017-02-23 DIAGNOSIS — Z951 Presence of aortocoronary bypass graft: Secondary | ICD-10-CM | POA: Diagnosis not present

## 2017-02-23 DIAGNOSIS — F419 Anxiety disorder, unspecified: Secondary | ICD-10-CM

## 2017-02-23 MED ORDER — CHLORTHALIDONE 25 MG PO TABS: 12.5000 mg | ORAL_TABLET | Freq: Every day | ORAL | 3 refills | Status: DC

## 2017-02-23 NOTE — Patient Instructions (Signed)
Medication Instructions:  START CHLORTHALIDONE 12.5 MG DAILY   Labwork: BMET- 1 WEEK   Testing/Procedures: NONE  Follow-Up: BLOOD PRESSURE CHECK ( ON DAY YOU COME IN FOR CARDIAC REHAB)   Any Other Special Instructions Will Be Listed Below (If Applicable).     If you need a refill on your cardiac medications before your next appointment, please call your pharmacy.

## 2017-02-23 NOTE — Progress Notes (Signed)
Cardiology Office Note   Date:  02/23/2017   ID:  Samuel Cook [birth year], MRN [id number]  PCP:  Samuel Cook  Cardiologist:  Samuel Cook  Chief Complaint  Patient presents with  . Coronary Artery Disease  . Hypertension    History of Present Illness: Samuel Cook is a 61 y.o. male who presents for ongoing assessment and management of coronary artery disease, with most recent cardiac catheterization in May 2018 revealing left main and mid LAD disease. The patient had coronary bypass grafting (LIMA to LAD, reverse SVG to OM, reverse SVG to PDA, with right thigh endoscopic SVG harvesting) the patient also had a postoperative TEE which revealed mild ASP  He was last seen in the office on 01/23/2017 with generalized soreness asked her on the site. He was walking 5 miles a day, climbing stairs at home for exercise and working in the yard. He was not interested in cardiac rehabilitation as he remains quite active without complaint. He was advised not to overexert himself and to consider cardiac rehabilitation for slow improvement in exercise and functioning to avoid overexertion.  Patient also was found to have  elevated blood pressure in the office, at 160/80. His lisinopril dose was increased to 20 mg twice a day, he was given a blood pressure recording sheet and was to write down blood pressure 3 times a day, and bring this with him on follow-up appointment.  He comes today without complaints. He has been medically compliant the blood pressure remains elevated. I reviewed that from cardiothoracic surgeon office and he was also found be elevated there. He is taking Xanax twice a day, with consideration for anxiety causing blood pressure elevation.  Past Medical History:  Diagnosis Date  . Anxiety   . Coronary artery disease   . GERD (gastroesophageal reflux disease)    occasionally  . Heart murmur    dx 1.5 mths ago.  No symptoms  . Hyperlipidemia   . Hypertension     Past  Surgical History:  Procedure Laterality Date  . CARDIAC CATHETERIZATION    . CORONARY ARTERY BYPASS GRAFT N/A 12/26/2016   Procedure: CORONARY ARTERY BYPASS GRAFTING (CABG) x 3 WITH ENDOSCOPIC HARVESTING OF RIGHT SAPHENOUS VEIN;  Surgeon: Samuel Ovens, MD;  Location: [facility] OR;  Service: Open Heart Surgery;  Laterality: N/A;  . LEFT HEART CATH AND CORONARY ANGIOGRAPHY N/A 12/12/2016   Procedure: Left Heart Cath and Coronary Angiography;  Surgeon: Samuel Records, MD;  Location: [facility] INVASIVE CV LAB;  Service: Cardiovascular;  Laterality: N/A;  . none     none  . TEE WITHOUT CARDIOVERSION N/A 12/26/2016   Procedure: TRANSESOPHAGEAL ECHOCARDIOGRAM (TEE);  Surgeon: Samuel Ovens, MD;  Location: [facility] OR;  Service: Open Heart Surgery;  Laterality: N/A;     Current Outpatient Prescriptions  Medication Sig Dispense Refill  . acetaminophen (TYLENOL) 500 MG tablet Take 1,000 mg by mouth 2 (two) times daily.    Marland Kitchen amLODipine (NORVASC) 10 MG tablet Take 1 tablet (10 mg total) by mouth daily. 30 tablet 1  . aspirin EC 325 MG EC tablet Take 1 tablet (325 mg total) by mouth daily. 30 tablet 0  . lisinopril (PRINIVIL,ZESTRIL) 20 MG tablet Take 1 tablet (20 mg total) by mouth 2 (two) times daily. 30 tablet 11  . LORazepam (ATIVAN) 0.5 MG tablet Take 1 tablet (0.5 mg total) by mouth 2 (two) times daily as needed for anxiety. 60 tablet 1  . nebivolol (BYSTOLIC) 10 MG  tablet Take 1 tablet (10 mg total) by mouth daily. 30 tablet 11  . rosuvastatin (CRESTOR) 20 MG tablet TAKE ONE TABLET BY MOUTH ONCE DAILY. 30 tablet 3   No current facility-administered medications for this visit.     Allergies:   No known allergies    Social History:  The patient  reports that he quit smoking about 23 years ago. He has a 20.00 pack-year smoking history. His smokeless tobacco use includes Snuff and Chew. He reports that he drinks alcohol. He reports that he uses drugs, including Marijuana and Codeine.   Family History:  The  patient's family history includes Birth defects in his maternal aunt; Cancer in his mother; Early death in his mother; Hearing loss in his maternal uncle; Heart disease in his maternal grandmother; Hyperlipidemia in his maternal aunt; Stroke in his father.    ROS: All other systems are reviewed and negative. Unless otherwise mentioned in H&P    PHYSICAL EXAM: VS:  BP (!) 166/82   Pulse 83   Ht 5\' 10"  (1.778 m)   Wt 185 lb (83.9 kg)   SpO2 97%   BMI 26.54 kg/m  , BMI Body mass index is 26.54 kg/m. GEN: Well nourished, well developed, in no acute distress  HEENT: normal  Neck: no JVD, carotid bruits, or masses Cardiac: RRR; no murmurs, rubs, or gallops,no edema  Respiratory:  clear to auscultation bilaterally, normal work of breathing GI: soft, nontender, nondistended, + BS MS: no deformity or atrophy  Skin: warm and dry, no rash. Well-healed sternotomy scar. Neuro:  Strength and sensation are intact Psych: euthymic mood, full affect   Recent Labs: 12/27/2016: Magnesium 2.1 02/02/2017: ALT 18; Hemoglobin 14.7; Platelets 357; TSH 0.74 02/19/2017: BUN 19; Creatinine, Ser 0.99; Potassium 4.2; Sodium 136    Lipid Panel    Component Value Date/Time   CHOL 114 02/02/2017 0839   TRIG 95 02/02/2017 0839   HDL 39 (L) 02/02/2017 0839   CHOLHDL 2.9 02/02/2017 0839   VLDL 19 02/02/2017 0839   LDLCALC 56 02/02/2017 0839      Wt Readings from Last 3 Encounters:  02/23/17 185 lb (83.9 kg)  02/16/17 187 lb 12.8 oz (85.2 kg)  02/04/17 184 lb (83.5 kg)      Other studies Reviewed: Echocardiogram November 01, 2016 Left ventricle: The cavity size was normal. Wall thickness was   increased in a pattern of moderate LVH. Systolic function was   normal. The estimated ejection fraction was in the range of 60%   to 65%. Doppler parameters are consistent with abnormal left   ventricular relaxation (grade 1 diastolic dysfunction).   Indeterminate filling pressures. - Ventricular septum: Septal  motion showed abnormal function and   dyssynergy. These changes are consistent with intraventricular   conduction delay. - Aortic valve: Moderately to severely calcified annulus.   Moderately thickened, moderately calcified leaflets. There was   moderate stenosis. Peak velocity (S): 324 cm/s. Mean gradient   (S): 23 mm Hg. Valve area (VTI): 1.44 cm^2. Valve area (Vmax):   1.39 cm^2. Valve area (Vmean): 1.36 cm^2.  Cardiac Cath 12/12/2016 Conclusion    Heavy coronary calcification throughout the left main and proximal to mid LAD.  Tubular 55-65% distal left main  Ostial 75% LAD  Ostial 90% first obtuse marginal  Nondominant right coronary with ostial 40-50% narrowing  Mild aortic stenosis with peak to peak gradient of 20 mmHg. Heavy calcification on echocardiography.  Normal LV systolic function with mildly elevated end-diastolic pressure consistent with diastolic  heart failure.  RECOMMENDATION:  With the accompanying abnormal nuclear study demonstrating anterior wall and apical ischemia, the patient will be referred to [facility] for coronary bypass grafting given distal left main and ostial LAD involvement.   The aortic valve is diseased but not severely stenosed. Recent echo did not demonstrate any significant aortic regurgitation. LV size and function were normal. It does not appear that valve replacement/therapy is indicated at this time but should be debated.     ASSESSMENT AND PLAN:  1. Coronary artery disease: Status post three-vessel coronary artery bypass grafting. Doing well without complaints of recurrent pain, rapid heart rhythm, or fatigue. Continue secondary prevention with beta blocker and ACE inhibitor aspirin statin. He is due to meet with cardiac rehabilitation next week to be enrolled in the program.  2. Hypertension: Blood pressure is elevated here in the office. I rechecked it again manually in both arms blood pressure running 180/88 bilaterally. I will start  chlorthalidone 12.5 mg daily in addition to current medication regimen of amlodipine 10 mg daily lisinopril 20 mg twice a day and nebivolol 10 mg daily. May need to consider increasing ACE inhibitor. We'll check BMET in one week along with return to the office for blood pressure check on the medication.   3. Hypercholesterolemia: Continue statin therapy  4. Chronic anxiety: Patient is on Ativan twice a day. Continues to have some anxiety throughout the day. Uncertain if this is contributing to hypertension.   Current medicines are reviewed at length with the patient today.    Labs/ tests ordered today include: BMET  Bettey MareKathryn M. Liborio NixonLawrence DNP, ANP, AACC   02/23/2017 1:59 PM    [facility] [address], [place], [place] [address] Phone: [phone]; Fax: [phone]

## 2017-03-02 ENCOUNTER — Other Ambulatory Visit (HOSPITAL_COMMUNITY)
Admission: RE | Admit: 2017-03-02 | Discharge: 2017-03-02 | Disposition: A | Discharge status: Home / Self Care | Payer: 59 | Source: Ambulatory Visit | Attending: Adult Health | Admitting: Adult Health

## 2017-03-02 DIAGNOSIS — I10 Essential (primary) hypertension: Secondary | ICD-10-CM | POA: Diagnosis present

## 2017-03-02 LAB — BASIC METABOLIC PANEL
Anion gap: 9 (ref 5–15)
BUN: 22 mg/dL — AB (ref 6–20)
CHLORIDE: 107 mmol/L (ref 101–111)
CO2: 28 mmol/L (ref 22–32)
CREATININE: 1.18 mg/dL (ref 0.61–1.24)
Calcium: 10.1 mg/dL (ref 8.9–10.3)
GFR calc Af Amer: >60 mL/min (ref >60)
GFR calc non Af Amer: >60 mL/min (ref >60)
Glucose, Bld: 119 mg/dL — ABNORMAL HIGH (ref 65–99)
Potassium: 4.3 mmol/L (ref 3.5–5.1)
SODIUM: 144 mmol/L (ref 135–145)

## 2017-03-10 ENCOUNTER — Encounter (HOSPITAL_COMMUNITY): Payer: 59

## 2017-03-10 ENCOUNTER — Ambulatory Visit: Payer: 59

## 2017-03-10 ENCOUNTER — Ambulatory Visit (INDEPENDENT_AMBULATORY_CARE_PROVIDER_SITE_OTHER): Payer: 59

## 2017-03-10 VITALS — BP 150/80 | HR 68 | Ht 69.0 in | Wt 185.0 lb

## 2017-03-10 DIAGNOSIS — I10 Essential (primary) hypertension: Secondary | ICD-10-CM

## 2017-03-10 NOTE — Progress Notes (Signed)
Pt came in for blood pressure check and is doing well. He brought in his blood pressure machine and it is off. So at least now he knows about how off it shows.

## 2017-03-10 NOTE — Progress Notes (Signed)
Thank you. Did not see the BP results.

## 2017-03-10 NOTE — Patient Instructions (Signed)
Medication Instructions:  Your physician recommends that you continue on your current medications as directed. Please refer to the Current Medication list given to you today.   Labwork: NONE  Testing/Procedures: NONE  Follow-Up: Your physician recommends that you schedule a follow-up appointment in: TO BE DETERMINED   Any Other Special Instructions Will Be Listed Below (If Applicable).     If you need a refill on your cardiac medications before your next appointment, please call your pharmacy.   

## 2017-03-19 ENCOUNTER — Ambulatory Visit (INDEPENDENT_AMBULATORY_CARE_PROVIDER_SITE_OTHER): Payer: Self-pay | Admitting: Cardiothoracic Surgery

## 2017-03-19 ENCOUNTER — Encounter: Payer: Self-pay | Admitting: Cardiothoracic Surgery

## 2017-03-19 VITALS — BP 122/73 | HR 76 | Resp 16 | Ht 69.0 in | Wt 182.0 lb

## 2017-03-19 DIAGNOSIS — Z951 Presence of aortocoronary bypass graft: Secondary | ICD-10-CM

## 2017-03-19 DIAGNOSIS — I2510 Atherosclerotic heart disease of native coronary artery without angina pectoris: Secondary | ICD-10-CM

## 2017-03-19 NOTE — Progress Notes (Signed)
[address]       [place], [id number]             [phone]      [name] W [facility] Medical Record #[id number] Date of Birth: [birth year]  Referring: Lyn RecordsSmith, Henry W, MD Primary Care: Dorena Bodoixon, [name], [place]  Chief Complaint:   POST OP FOLLOW UP  12/26/2016  SURGICAL PROCEDURE:  Coronary artery bypass grafting x3 with the left internal mammary to the left anterior descending coronary artery, reverse saphenous vein graft to the obtuse marginal coronary artery, reverse saphenous vein graft to the posterior descending coronary artery with right thigh and calf greater saphenous vein endoscopic harvesting.  History of Present Illness:     Patient doing well postoperatively. He's had no recurrent angina or evidence congestive heart failure. He notes that his endurance is slowly increasing he's to start work next week. This does involve some lifting with machinery, I told him to avoid for another month lifting over 25 pounds.    Past Medical History:  Diagnosis Date  . Anxiety   . Coronary artery disease   . GERD (gastroesophageal reflux disease)    occasionally  . Heart murmur    dx 1.5 mths ago.  No symptoms  . Hyperlipidemia   . Hypertension      History  Smoking Status  . Former Smoker  . Packs/day: 1.00  . Years: 20.00  . Quit date: 10/20/1993  Smokeless Tobacco  . Current User  . Types: Snuff, Chew    History  Alcohol Use  . Yes    Comment: rare      Allergies  Allergen Reactions  . No Known Allergies     Current Outpatient Prescriptions  Medication Sig Dispense Refill  . acetaminophen (TYLENOL) 500 MG tablet Take 1,000 mg by mouth 2 (two) times daily.    Marland Kitchen. amLODipine (NORVASC) 10 MG tablet Take 1 tablet (10 mg total) by mouth daily. 30 tablet 1  . aspirin EC 325 MG EC tablet Take 1 tablet (325 mg total) by mouth daily. 30 tablet 0  . chlorthalidone (HYGROTON) 25 MG tablet Take 0.5 tablets (12.5 mg total) by mouth daily. 45 tablet  3  . lisinopril (PRINIVIL,ZESTRIL) 20 MG tablet Take 1 tablet (20 mg total) by mouth 2 (two) times daily. 30 tablet 11  . LORazepam (ATIVAN) 0.5 MG tablet Take 1 tablet (0.5 mg total) by mouth 2 (two) times daily as needed for anxiety. 60 tablet 1  . nebivolol (BYSTOLIC) 10 MG tablet Take 1 tablet (10 mg total) by mouth daily. 30 tablet 11  . rosuvastatin (CRESTOR) 20 MG tablet TAKE ONE TABLET BY MOUTH ONCE DAILY. 30 tablet 3   No current facility-administered medications for this visit.        Physical Exam: BP 122/73 (BP Location: Right Arm, Patient Position: Sitting, Cuff Size: Large)   Pulse 76   Resp 16   Ht 5\' 9"  (1.753 m)   Wt 182 lb (82.6 kg)   SpO2 96% Comment: ON RA  BMI 26.88 kg/m   General appearance: alert and cooperative Neurologic: intact Heart: regular rate and rhythm, S1, S2 normal, no murmur, click, rub or gallop Lungs: clear to auscultation bilaterally Abdomen: soft, non-tender; bowel sounds normal; no masses,  no organomegaly Extremities: extremities normal, atraumatic, no cyanosis or edema Wound: Sternum is stable and well-healed   Diagnostic Studies & Laboratory data:     Recent Radiology Findings:  No x-ray  done today   Recent Lab Findings: Lab Results  Component Value Date   WBC 11.4 (H) 02/02/2017   HGB 14.7 02/02/2017   HCT 46.0 02/02/2017   PLT 357 02/02/2017   GLUCOSE 119 (H) 03/02/2017   CHOL 114 02/02/2017   TRIG 95 02/02/2017   HDL 39 (L) 02/02/2017   LDLCALC 56 02/02/2017   ALT 18 02/02/2017   AST 16 02/02/2017   NA 144 03/02/2017   K 4.3 03/02/2017   CL 107 03/02/2017   CREATININE 1.18 03/02/2017   BUN 22 (H) 03/02/2017   CO2 28 03/02/2017   TSH 0.74 02/02/2017   INR 1.35 12/26/2016   HGBA1C 6.3 (H) 12/25/2016      Assessment / Plan:      Patient making good progress following coronary artery bypass grafting without any complicating features. We'll continue to be followed by cardiology I've not made him a return  appointment in the surgical office but would be glad to see him as needed.   Delight OvensEdward B Wilmon Conover MD      [address] [place] [id number] Office [phone]   Beeper [phone]  03/19/2017 9:25 AM

## 2017-03-23 ENCOUNTER — Other Ambulatory Visit: Payer: Self-pay | Admitting: Physician Assistant

## 2017-04-08 ENCOUNTER — Telehealth: Payer: Self-pay | Admitting: Physician Assistant

## 2017-04-08 NOTE — Telephone Encounter (Signed)
Pt came by and dropped off new healthcare insurance forms that need to be filled out. Put in yellow folder 04/08/2017.

## 2017-04-09 NOTE — Telephone Encounter (Signed)
Voluntary wellness form placed on top of [facility] folder

## 2017-04-09 NOTE — Telephone Encounter (Signed)
Form has been completed patient is aware

## 2017-04-14 ENCOUNTER — Encounter (HOSPITAL_COMMUNITY): Admission: RE | Admit: 2017-04-14 | Payer: 59 | Source: Ambulatory Visit

## 2017-04-21 ENCOUNTER — Other Ambulatory Visit: Payer: Self-pay | Admitting: Physician Assistant

## 2017-04-21 DIAGNOSIS — F411 Generalized anxiety disorder: Secondary | ICD-10-CM

## 2017-04-21 NOTE — Telephone Encounter (Signed)
Ok to refill 

## 2017-04-22 NOTE — Telephone Encounter (Signed)
Approved # [phone] [id number] 

## 2017-05-04 ENCOUNTER — Other Ambulatory Visit: Payer: Self-pay | Admitting: Physician Assistant

## 2017-05-05 NOTE — Telephone Encounter (Signed)
Refill appropriate 

## 2017-05-07 ENCOUNTER — Encounter: Payer: Self-pay | Admitting: Physician Assistant

## 2017-05-07 ENCOUNTER — Ambulatory Visit (INDEPENDENT_AMBULATORY_CARE_PROVIDER_SITE_OTHER): Payer: 59 | Admitting: Physician Assistant

## 2017-05-07 VITALS — BP 130/74 | HR 65 | Temp 97.7°F | Resp 14 | Ht 69.0 in | Wt 184.6 lb

## 2017-05-07 DIAGNOSIS — Z951 Presence of aortocoronary bypass graft: Secondary | ICD-10-CM | POA: Diagnosis not present

## 2017-05-07 DIAGNOSIS — E785 Hyperlipidemia, unspecified: Secondary | ICD-10-CM

## 2017-05-07 DIAGNOSIS — I10 Essential (primary) hypertension: Secondary | ICD-10-CM | POA: Diagnosis not present

## 2017-05-07 DIAGNOSIS — I350 Nonrheumatic aortic (valve) stenosis: Secondary | ICD-10-CM

## 2017-05-07 DIAGNOSIS — R739 Hyperglycemia, unspecified: Secondary | ICD-10-CM

## 2017-05-07 NOTE — Progress Notes (Signed)
Patient ID: BIRUK TROIA MRN: [id number], DOB: 10/30/1955, 61 y.o. Date of Encounter: @  Chief Complaint:  Chief Complaint  Patient presents with  . routine follow up    HPI: 61 y.o. year old male     10/20/2016: presents as a New Patient to Establish Care.   He was seeing Dr. Megan Mans in [place]. Last routine office visit there was in November. Says that he would see him routinely to follow-up his high blood pressure and high cholesterol. Says he has "white coat hypertension" and that "it took him 2 to 3 years to get comfortable enough with Dr. Megan Mans to where he did not have high blood pressure there." However, he also states that he has no blood pressure machine at home therefore was not checking his blood pressure anywhere else.  Says that he would go there for routine office visit every 6 months and would have complete physical exam every 12 months. "Would check prostate etc.".  [name] 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 Several of his coworkers see me and have recommended me as his provider.  He brings in his 3 prescription medicine bottles of the prescription medicines he takes on a daily basis. Crestor 20 mg daily  Ativan 0.5 mg daily Lisinopril 10 mg daily  Says that back in 2001 he was laid off from [employer] for 1-1/2 years. At that time provider felt that he was having some anxiety and depression. Says that at that time he was taking the lorazepam 4 times a day but now just takes it one every morning.  I saw some documentation in epic that indicated he had seen cardiology remotely. He says that remotely he went to the ER with chest pain and then follow-up they had him see cardiology but that was in negative evaluation.  Has no specific concerns to address today. States he has no other chronic medical problems that he knows of.  Noted that BP very high today. He states he is having no angina symptoms, even with exertion. No  stroke/TIA symptoms---no weakness in any extremity, no slurred speech etc  Malignant hypertension / Essential hypertension Repeated BP myself---Left: 240/100---Right: 210/90 Gave Clonidine 0.1mg  here in the office to bring BP down.  Will increase dose of lisinopril from 10 to . Will add Norvasc .  Check CMET today F/U OV 2 weeks to recheck BP and BMET - lisinopril (PRINIVIL,ZESTRIL) 20 MG tablet; Take 1 tablet (20 mg total) by mouth daily.  Dispense: 30 tablet; Refill: 0 - amLODipine (NORVASC) 5 MG tablet; Take 1 tablet (5 mg total) by mouth daily.  Dispense: 30 tablet; Refill: 0  Hyperlipidemia, unspecified hyperlipidemia type He is not fasting. Will check LFTs to monitor. Wait to check FLP at future OV - COMPLETE METABOLIC PANEL WITH GFR Murmur I asked if he had been told he had murmur in past, if he had had echo---says no. Obtain echo.  - ECHOCARDIOGRAM COMPLETE; Future    11/03/2016: Today he reports that he is taking the blood pressure medications as directed. He is having no adverse effects. Reports that he went and bought a blood pressure machine so that he can check blood pressure at home. He has written down some readings and has brought those in. First 3 readings documented are from 3/16. Then he has one reading for each day for 3/17, 3/18, and 3/19. 167/89, 171/81, 169/85, 137/84, 159/90, 169/91  He did go for echocardiogram that was ordered  at last visit. This was performed on 10/29/2016. The significant finding on this--- is of the aortic valve:  moderately to severely calcified annulus. Moderately thickened, moderately calcified leaflets. Moderate stenosis.  Has no complaints or concerns today. Just keeps talking about the fact that in the past his blood pressure has always been high at doctor's visits and that it took him several years to get comfortable enough with his prior provider to where his blood pressure was not high there. Continues to say that he feels  perfectly fine with no symptoms.  Malignant hypertension/ Essential hypertension Continue lisinopril  . Check BMET to f/u after increasing dose at LOV Will increase Norvasc to  QD Will add Bystolic  QD F/U OV 2 weeks.  Cont to check blood pressure at home and document readings and bring those to the next visit.  Hyperlipidemia, unspecified hyperlipidemia type He is not fasting. Will check LFTs to monitor. Wait to check FLP at future OV - COMPLETE METABOLIC PANEL WITH GFR  Murmur S/P Echo 10/29/2016-- showing aortic valve: Moderately to severely calcified annulus. Moderately thickened, moderately calcified leaflets. Moderate stenosis.  Aortic valve stenosis, etiology of cardiac valve disease unspecified S/P Echo 10/29/2016-- showing aortic valve: Moderately to severely calcified annulus. Moderately thickened, moderately calcified leaflets. Moderate stenosis. Will refer him to cardiology for follow-up - Ambulatory referral to Cardiology     11/17/2016: Today he brings in blood pressure log --has one reading per day. Readings are as follows: 194/90, 163/94, 172/96, 140/87, 162/95, 171/94, 129/83, 139/83, 134/82, 153/93 Today he reports that he has recently been noticing some discomfort in his left shoulder region. Sleeps on his left side--doesn't know if that is affecting this. He states that he has appointment to see Cardiology April 5. No other concerns to address today.     02/04/2017: HPI: 61 y.o. y/o male here for CPE.   Today I have reviewed his hospital discharge summary. Hospitalized 12/26/16 35/16/18. He had CABG 3 by Dr. Tyrone Sage. 12/26/16. After reviewing the hospital discharge summary--this indicates that he had no postoperative complications at all. Discharge summary does state to stop amlodipine and fish oil. Was discharged home with aspirin, lisinopril 20 mg daily, lorazepam 0.5 as needed, Bystolic 10 mg daily, omeprazole 20 mg daily when necessary, Crestor  20 mg daily. Also oxycodone and Tylenol for pain.  Prior to his surgery he had a positive stress test and cardiac catheterization showed evidence of left main obstruction. Also noted to have abnormal aortic valve. Was subsequently scheduled for CABG.  Today patient states that since the surgery--- says that at first when he got home he was having difficulty sleeping at night but then during the day all of a sudden would follow sleep for the next thing he would know he be waking up. However says that all of this has improved and pretty much resolved. Says that his blood pressure medicines have been adjusted and that his lisinopril was increased from once a day to twice a day recently and he had some issues with the pharmacy etc. but has now gotten that straightened out and did confirm that he is now taking the appropriate dose. He also states that his anxiety has been increased ever since he's been home from the hospital. Also says that he has been out of his lorazepam and has had none available since the hospitalization. Is feeling much more anxious than usual. Says his next appointment with cardiology as July 9. States that he is scheduled to see Dr.  Gerhardt tomorrow and they're supposed to discuss therapy and discuss driving again. Says that so far since his surgery he has not been able to drive.  No other concerns to address today. States that he is scheduled to do preventive care/complete physical exam today.   Addendum added---02/16/2017: I was just cc-ed note from PA with [facility]. Their note included that patient had A1c of 6.3 on 12/25/16. I will make a note of this to follow-up at future visits/lab. Noted that glucose was normal on his recent lab with me and suspect that he has improved his diet since CABG.  However I will discuss low carbohydrate diet at future visit and monitor A1c.   AT OV 02/04/2017: Updated Preventive Care.    05/07/2017: Today he reports that he stopped the  amlodipine because this was causing severe headache. Says that his prior PCP had prescribed him that medication in the past and it caused headache and he had to stop it back then. States that more recently he was having severe headache and felt horrible and felt quite certain that it was from that medicine so he has stopped it. Says that once he stopped that medication, the headache resolved. Has had no recurrence of headache. Stopped the amlodipine 2 weeks ago.  Today I did give and review information regarding low carbohydrate diet. He says that his "wife keeps a bowl of candy there for the grandkids" and that he does eat some of that. Also does eat quite a bit of bread and other foods with lots of carbs. Says that he will work on decreasing these foods and making these changes. Today I also explained about hemoglobin A1c and explained that we'll recheck that today and will monitor this in the future as well.  He has no other specific concerns to address today. Reviewed that he had office visit with [facility] on 03/19/17 and office visit with Cardiology 02/23/17.  He is taking his Crestor as directed. No myalgias or other adverse effects. He is taking his Bystolic and lisinopril and other medications as directed and those are causing no adverse effects.     Past Medical History:  Diagnosis Date  . Anxiety   . Coronary artery disease   . GERD (gastroesophageal reflux disease)    occasionally  . Heart murmur    dx 1.5 mths ago.  No symptoms  . Hyperlipidemia   . Hypertension      Home Meds: Outpatient Medications Prior to Visit  Medication Sig Dispense Refill  . acetaminophen (TYLENOL) 500 MG tablet Take 1,000 mg by mouth 2 (two) times daily.    Marland Kitchen aspirin EC 325 MG EC tablet Take 1 tablet (325 mg total) by mouth daily. 30 tablet 0  . chlorthalidone (HYGROTON) 25 MG tablet Take 0.5 tablets (12.5 mg total) by mouth daily. 45 tablet 3  . lisinopril (PRINIVIL,ZESTRIL) 20 MG tablet Take 1 tablet  (20 mg total) by mouth 2 (two) times daily. 30 tablet 11  . LORazepam (ATIVAN) 0.5 MG tablet TAKE 1 TABLET BY MOUTH TWICE DAILY AS NEEDED FOR ANXIETY. 60 tablet 2  . nebivolol (BYSTOLIC) 10 MG tablet Take 1 tablet (10 mg total) by mouth daily. 30 tablet 11  . rosuvastatin (CRESTOR) 20 MG tablet TAKE (1) TABLET BY MOUTH ONCE DAILY. 30 tablet 2  . amLODipine (NORVASC) 10 MG tablet Take 1 tablet (10 mg total) by mouth daily. (Patient not taking: Reported on 05/07/2017) 30 tablet 1   No facility-administered medications prior to visit.  Allergies:  Allergies  Allergen Reactions  . No Known Allergies   . Norvasc [Amlodipine Besylate] Other (See Comments)    Headache    Social History   Social History  . Marital status: Married    Spouse name: N/A  . Number of children: N/A  . Years of education: N/A   Occupational History  . Not on file.   Social History Main Topics  . Smoking status: Former Smoker    Packs/day: 1.00    Years: 20.00    Quit date: 10/20/1993  . Smokeless tobacco: Current User    Types: Snuff, Chew  . Alcohol use Yes     Comment: rare   . Drug use: Yes    Types: Marijuana, Codeine     Comment: occasionally....takes 1 puff when really stressed  . Sexual activity: Yes   Other Topics Concern  . Not on file   Social History Narrative  . No narrative on file    Family History  Problem Relation Age of Onset  . Cancer Mother   . Early death Mother   . Stroke Father   . Birth defects Maternal Aunt   . Hyperlipidemia Maternal Aunt   . Hearing loss Maternal Uncle   . Heart disease Maternal Grandmother      Review of Systems:  See HPI for pertinent ROS. All other ROS negative.    Physical Exam: Blood pressure 130/74, pulse 65, temperature 97.7 F (36.5 C), temperature source Oral, resp. rate 14, height 5\' 9"  (1.753 m), weight 83.7 kg (184 lb 9.6 oz), SpO2 98 %., Body mass index is 27.26 kg/m. General: WNWD WM. Appears in no acute distress. Neck:  Supple. No thyromegaly. No lymphadenopathy. No carotid bruit Lungs: Clear bilaterally to auscultation without wheezes, rales, or rhonchi. Breathing is unlabored. Heart: RRR with S1 S2. III/VI murmur at apex Musculoskeletal:  Strength and tone normal for age. Extremities/Skin: Warm and dry.  No edema. Neuro: Alert and oriented X 3. Moves all extremities spontaneously. Gait is normal. CNII-XII grossly in tact. Psych:  Responds to questions appropriately with a normal affect.     ASSESSMENT AND PLAN:  61 y.o. year old male with   Essential hypertension 05/07/2017: He reports that he stopped the Norvasc 2 weeks ago secondary to adverse effects. I have added this to his "allergy list "so that this medication will not be re-started in future. Blood pressure is stable today. Will continue current medications.   Aortic valve stenosis, etiology of cardiac valve disease unspecified S/P Echo 10/29/2016-- showing aortic valve: Moderately to severely calcified annulus. Moderately thickened, moderately calcified leaflets. Moderate stenosis. 05/07/2017: This is now managed by cardiology, [facility] S.   Hyperlipidemia, unspecified hyperlipidemia type 05/07/2017: He is on Crestor. He had FLP, LFT with me 02/02/17 to monitor. Continue Crestor.   Murmur S/P Echo 10/29/2016-- showing aortic valve: Moderately to severely calcified annulus. Moderately thickened, moderately calcified leaflets. Moderate stenosis. 05/07/2017: This is now managed by cardiology, CVT S.   S/P CABG x 3 05/07/2017: This is now managed by cardiology, CVT S.   Hyperglycemia 05/07/2017: Today I did give and review information regarding low carbohydrate diet. He says that his "wife keeps a bowl of candy there for the grandkids" and that he does eat some of that. Also does eat quite a bit of bread and other foods with lots of carbs. Says that he will work on decreasing these foods and making these changes. Today I also explained about hemoglobin  A1c and explained  that we'll recheck that today and will monitor this in the future as well. - Hemoglobin A1c  Preventive Care:  05/07/2017---He had CPE with me 02/04/2017---see that note regarding Preventive Care    Signed, [address] [place], [place], [facility] 05/07/2017 1:39 PM

## 2017-05-08 LAB — HEMOGLOBIN A1C
EAG (MMOL/L): 7.6 (calc)
Hgb A1c MFr Bld: 6.4 % of total Hgb — ABNORMAL HIGH (ref <5.7)
Mean Plasma Glucose: 137 (calc)

## 2017-08-01 ENCOUNTER — Other Ambulatory Visit: Payer: Self-pay | Admitting: Physician Assistant

## 2017-08-03 NOTE — Telephone Encounter (Signed)
Refill appropriate 

## 2017-08-05 ENCOUNTER — Other Ambulatory Visit: Payer: Self-pay | Admitting: Family Medicine

## 2017-08-05 DIAGNOSIS — Z79899 Other long term (current) drug therapy: Secondary | ICD-10-CM

## 2017-08-05 DIAGNOSIS — R739 Hyperglycemia, unspecified: Secondary | ICD-10-CM

## 2017-08-05 DIAGNOSIS — E785 Hyperlipidemia, unspecified: Secondary | ICD-10-CM

## 2017-08-05 DIAGNOSIS — I10 Essential (primary) hypertension: Secondary | ICD-10-CM

## 2017-08-06 ENCOUNTER — Ambulatory Visit: Payer: 59 | Admitting: Physician Assistant

## 2017-08-06 ENCOUNTER — Other Ambulatory Visit: Payer: Self-pay

## 2017-08-06 ENCOUNTER — Ambulatory Visit (INDEPENDENT_AMBULATORY_CARE_PROVIDER_SITE_OTHER): Payer: 59 | Admitting: Physician Assistant

## 2017-08-06 ENCOUNTER — Encounter: Payer: Self-pay | Admitting: Physician Assistant

## 2017-08-06 ENCOUNTER — Other Ambulatory Visit: Payer: 59

## 2017-08-06 VITALS — BP 130/82 | HR 69 | Temp 98.1°F | Resp 18 | Wt 181.2 lb

## 2017-08-06 DIAGNOSIS — I10 Essential (primary) hypertension: Secondary | ICD-10-CM

## 2017-08-06 DIAGNOSIS — Z951 Presence of aortocoronary bypass graft: Secondary | ICD-10-CM

## 2017-08-06 DIAGNOSIS — I350 Nonrheumatic aortic (valve) stenosis: Secondary | ICD-10-CM | POA: Diagnosis not present

## 2017-08-06 DIAGNOSIS — Z79899 Other long term (current) drug therapy: Secondary | ICD-10-CM

## 2017-08-06 DIAGNOSIS — E785 Hyperlipidemia, unspecified: Secondary | ICD-10-CM | POA: Diagnosis not present

## 2017-08-06 DIAGNOSIS — R739 Hyperglycemia, unspecified: Secondary | ICD-10-CM

## 2017-08-06 NOTE — Progress Notes (Signed)
Patient ID: Samuel Cook MRN: [id number], DOB: [birth year], 61 y.o. Date of Encounter: @DATE @  Chief Complaint:  Chief Complaint  Patient presents with  . 3 month follow up    HPI: 61 y.o. year old male     10/20/2016: presents as a New Patient to Establish Care.   He was seeing Dr. [name] in [place]. Last routine office visit there was in November. Says that he would see him routinely to follow-up his high blood pressure and high cholesterol. Says he has "white coat hypertension" and that "it took him 2 to 3 years to get comfortable enough with Dr. [name] to where he did not have high blood pressure there." However, he also states that he has no blood pressure machine at home therefore was not checking his blood pressure anywhere else.  Says that he would go there for routine office visit every 6 months and would have complete physical exam every 12 months. "Would check prostate etc.".  Works at [employer]. 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 Several of his coworkers see me and have recommended me as his provider.  He brings in his 3 prescription medicine bottles of the prescription medicines he takes on a daily basis. Crestor 20 mg daily  Ativan 0.5 mg daily Lisinopril 10 mg daily  Says that back in 2001 he was laid off from [employer] for 1-1/2 years. At that time provider felt that he was having some anxiety and depression. Says that at that time he was taking the lorazepam 4 times a day but now just takes it one every morning.  I saw some documentation in epic that indicated he had seen cardiology remotely. He says that remotely he went to the ER with chest pain and then follow-up they had him see cardiology but that was in negative evaluation.  Has no specific concerns to address today. States he has no other chronic medical problems that he knows of.  Noted that BP very high today. He states he is having no angina symptoms, even with exertion. No  stroke/TIA symptoms---no weakness in any extremity, no slurred speech etc  Malignant hypertension / Essential hypertension Repeated BP myself---Left: 240/100---Right: 210/90 Gave Clonidine 0.1mg  here in the office to bring BP down.  Will increase dose of lisinopril from 10 to 20mg . Will add Norvasc 5mg .  Check CMET today F/U OV 2 weeks to recheck BP and BMET - lisinopril (PRINIVIL,ZESTRIL) 20 MG tablet; Take 1 tablet (20 mg total) by mouth daily.  Dispense: 30 tablet; Refill: 0 - amLODipine (NORVASC) 5 MG tablet; Take 1 tablet (5 mg total) by mouth daily.  Dispense: 30 tablet; Refill: 0  Hyperlipidemia, unspecified hyperlipidemia type He is not fasting. Will check LFTs to monitor. Wait to check FLP at future OV - COMPLETE METABOLIC PANEL WITH GFR Murmur I asked if he had been told he had murmur in past, if he had had echo---says no. Obtain echo.  - ECHOCARDIOGRAM COMPLETE; Future    11/03/2016: Today he reports that he is taking the blood pressure medications as directed. He is having no adverse effects. Reports that he went and bought a blood pressure machine so that he can check blood pressure at home. He has written down some readings and has brought those in. First 3 readings documented are from 3/16. Then he has one reading for each day for 3/17, 3/18, and 3/19. 167/89, 171/81, 169/85, 137/84, 159/90, 169/91  He did go for echocardiogram that was  ordered at last visit. This was performed on 10/29/2016. The significant finding on this--- is of the aortic valve:  moderately to severely calcified annulus. Moderately thickened, moderately calcified leaflets. Moderate stenosis.  Has no complaints or concerns today. Just keeps talking about the fact that in the past his blood pressure has always been high at doctor's visits and that it took him several years to get comfortable enough with his prior provider to where his blood pressure was not high there. Continues to say that he feels  perfectly fine with no symptoms.  Malignant hypertension/ Essential hypertension Continue lisinopril  20mg . Check BMET to f/u after increasing dose at LOV Will increase Norvasc to 10mg  QD Will add Bystolic 5mg  QD F/U OV 2 weeks.  Cont to check blood pressure at home and document readings and bring those to the next visit.  Hyperlipidemia, unspecified hyperlipidemia type He is not fasting. Will check LFTs to monitor. Wait to check FLP at future OV - COMPLETE METABOLIC PANEL WITH GFR  Murmur S/P Echo 10/29/2016-- showing aortic valve: Moderately to severely calcified annulus. Moderately thickened, moderately calcified leaflets. Moderate stenosis.  Aortic valve stenosis, etiology of cardiac valve disease unspecified S/P Echo 10/29/2016-- showing aortic valve: Moderately to severely calcified annulus. Moderately thickened, moderately calcified leaflets. Moderate stenosis. Will refer him to cardiology for follow-up - Ambulatory referral to Cardiology     11/17/2016: Today he brings in blood pressure log --has one reading per day. Readings are as follows: 194/90, 163/94, 172/96, 140/87, 162/95, 171/94, 129/83, 139/83, 134/82, 153/93 Today he reports that he has recently been noticing some discomfort in his left shoulder region. Sleeps on his left side--doesn't know if that is affecting this. He states that he has appointment to see Cardiology April 5. No other concerns to address today.     02/04/2017: HPI: 61 y.o. y/o male here for CPE.   Today I have reviewed his hospital discharge summary. Hospitalized 12/26/16 - 12/31/16. He had CABG 3 by Dr. Tyrone SageGerhardt. 12/26/16. After reviewing the hospital discharge summary--this indicates that he had no postoperative complications at all. Discharge summary does state to stop amlodipine and fish oil. Was discharged home with aspirin, lisinopril 20 mg daily, lorazepam 0.5 as needed, Bystolic 10 mg daily, omeprazole 20 mg daily when necessary, Crestor  20 mg daily. Also oxycodone and Tylenol for pain.  Prior to his surgery he had a positive stress test and cardiac catheterization showed evidence of left main obstruction. Also noted to have abnormal aortic valve. Was subsequently scheduled for CABG.  Today patient states that since the surgery--- says that at first when he got home he was having difficulty sleeping at night but then during the day all of a sudden would fall asleep and the next thing he would know he be waking up. However says that all of this has improved and pretty much resolved. Says that his blood pressure medicines have been adjusted and that his lisinopril was increased from once a day to twice a day recently and he had some issues with the pharmacy etc. but has now gotten that straightened out and did confirm that he is now taking the appropriate dose. He also states that his anxiety has been increased ever since he's been home from the hospital. Also says that he has been out of his lorazepam and has had none available since the hospitalization. Is feeling much more anxious than usual. Says his next appointment with cardiology is July 9. States that he is scheduled to  see Dr. Tyrone Sage tomorrow and they're supposed to discuss therapy and discuss driving again. Says that so far since his surgery he has not been able to drive.  No other concerns to address today. States that he is scheduled to do preventive care/complete physical exam today.   Addendum added---02/16/2017: I was just cc-ed note from PA with [facility]. Their note included that patient had A1c of 6.3 on 12/25/16. I will make a note of this to follow-up at future visits/lab. Noted that glucose was normal on his recent lab with me and suspect that he has improved his diet since CABG.  However I will discuss low carbohydrate diet at future visit and monitor A1c.   AT OV 02/04/2017: Updated Preventive Care.    05/07/2017: Today he reports that he stopped the  amlodipine because this was causing severe headache. Says that his prior PCP had prescribed him that medication in the past and it caused headache and he had to stop it back then. States that more recently he was having severe headache and felt horrible and felt quite certain that it was from that medicine so he has stopped it. Says that once he stopped that medication, the headache resolved. Has had no recurrence of headache. Stopped the amlodipine 2 weeks ago.  Today I did give and review information regarding low carbohydrate diet. He says that his "wife keeps a bowl of candy there for the grandkids" and that he does eat some of that. Also does eat quite a bit of bread and other foods with lots of carbs. Says that he will work on decreasing these foods and making these changes. Today I also explained about hemoglobin A1c and explained that we'll recheck that today and will monitor this in the future as well.  He has no other specific concerns to address today. Reviewed that he had office visit with [facility] on 03/19/17 and office visit with Cardiology 02/23/17.  He is taking his Crestor as directed. No myalgias or other adverse effects. He is taking his Bystolic and lisinopril and other medications as directed and those are causing no adverse effects.    08/06/2017: Today he reports that he is feeling the best he has felt in years.  Says that he feels great. He has been doing very good job with his diet.  Is that he has stopped eating candy and has stopped sodas.  Says that he is eating lots of apples.  Says that using his scales at home he has lost from 200 pounds down to 175 pounds. He is taking his Crestor as directed. No myalgias or other adverse effects. He is taking his Bystolic and lisinopril and other medications as directed and those are causing no adverse effects. He has no specific concerns to address today.  Reports that there are 2 medications that he has to pay over $40 --for each of  those medicines--- Bystolic and the Crestor.  Told him that at each visit I will give him samples of Bystolic to try to offset the cost.   Past Medical History:  Diagnosis Date  . Anxiety   . Coronary artery disease   . GERD (gastroesophageal reflux disease)    occasionally  . Heart murmur    dx 1.5 mths ago.  No symptoms  . Hyperlipidemia   . Hypertension      Home Meds: Outpatient Medications Prior to Visit  Medication Sig Dispense Refill  . acetaminophen (TYLENOL) 500 MG tablet Take 1,000 mg by mouth 2 (two)  times daily.    Marland Kitchen aspirin EC 325 MG EC tablet Take 1 tablet (325 mg total) by mouth daily. 30 tablet 0  . chlorthalidone (HYGROTON) 25 MG tablet Take 0.5 tablets (12.5 mg total) by mouth daily. 45 tablet 3  . lisinopril (PRINIVIL,ZESTRIL) 20 MG tablet Take 1 tablet (20 mg total) by mouth 2 (two) times daily. 30 tablet 11  . LORazepam (ATIVAN) 0.5 MG tablet TAKE 1 TABLET BY MOUTH TWICE DAILY AS NEEDED FOR ANXIETY. 60 tablet 2  . nebivolol (BYSTOLIC) 10 MG tablet Take 1 tablet (10 mg total) by mouth daily. 30 tablet 11  . rosuvastatin (CRESTOR) 20 MG tablet TAKE (1) TABLET BY MOUTH ONCE DAILY. 30 tablet 0   No facility-administered medications prior to visit.       Allergies:  Allergies  Allergen Reactions  . No Known Allergies   . Norvasc [Amlodipine Besylate] Other (See Comments)    Headache    Social History   Socioeconomic History  . Marital status: Married    Spouse name: Not on file  . Number of children: Not on file  . Years of education: Not on file  . Highest education level: Not on file  Social Needs  . Financial resource strain: Not on file  . Food insecurity - worry: Not on file  . Food insecurity - inability: Not on file  . Transportation needs - medical: Not on file  . Transportation needs - non-medical: Not on file  Occupational History  . Not on file  Tobacco Use  . Smoking status: Former Smoker    Packs/day: 1.00    Years: 20.00    Pack  years: 20.00    Last attempt to quit: 10/20/1993    Years since quitting: 23.8  . Smokeless tobacco: Current User    Types: Snuff, Chew  Substance and Sexual Activity  . Alcohol use: Yes    Comment: rare   . Drug use: Yes    Types: Marijuana, Codeine    Comment: occasionally....takes 1 puff when really stressed  . Sexual activity: Yes  Other Topics Concern  . Not on file  Social History Narrative  . Not on file    Family History  Problem Relation Age of Onset  . Cancer Mother   . Early death Mother   . Stroke Father   . Birth defects Maternal Aunt   . Hyperlipidemia Maternal Aunt   . Hearing loss Maternal Uncle   . Heart disease Maternal Grandmother      Review of Systems:  See HPI for pertinent ROS. All other ROS negative.    Physical Exam: Blood pressure 130/82, pulse 69, temperature 98.1 F (36.7 C), temperature source Oral, resp. rate 18, weight 82.2 kg (181 lb 3.2 oz), SpO2 99 %., There is no height or weight on file to calculate BMI. General: WNWD WM. Appears in no acute distress. Neck: Supple. No thyromegaly. No lymphadenopathy. No carotid bruit Lungs: Clear bilaterally to auscultation without wheezes, rales, or rhonchi. Breathing is unlabored. Heart: RRR with S1 S2. III/VI murmur at apex Musculoskeletal:  Strength and tone normal for age. Extremities/Skin: Warm and dry.  No edema. Neuro: Alert and oriented X 3. Moves all extremities spontaneously. Gait is normal. CNII-XII grossly in tact. Psych:  Responds to questions appropriately with a normal affect.     ASSESSMENT AND PLAN:  61 y.o. year old male with   Reports that there are 2 medications that he has to pay over $40 --for each of  those medicines--- Bystolic and the Crestor.  Told him that at each visit I will give him samples of Bystolic to try to offset the cost.   Essential hypertension 08/06/2017: Blood pressure is at goal/controlled.  Continue current medication.  Check lab to monitor.  Aortic  valve stenosis, etiology of cardiac valve disease unspecified S/P Echo 10/29/2016-- showing aortic valve: Moderately to severely calcified annulus. Moderately thickened, moderately calcified leaflets. Moderate stenosis. 08/06/2017: This is now managed by cardiology, [facility].   Hyperlipidemia, unspecified hyperlipidemia type 08/06/2017: He is on Crestor.  He came fasting for labs this morning.  We will follow-up those results.   Murmur S/P Echo 10/29/2016-- showing aortic valve: Moderately to severely calcified annulus. Moderately thickened, moderately calcified leaflets. Moderate stenosis. 08/06/2017: This is now managed by Cardiology, [facility].   S/P CABG x 3 08/06/2017: This is now managed by Cardiology, [facility].   Hyperglycemia 05/07/2017: Today I did give and review information regarding low carbohydrate diet. He says that his "wife keeps a bowl of candy there for the grandkids" and that he does eat some of that. Also does eat quite a bit of bread and other foods with lots of carbs. Says that he will work on decreasing these foods and making these changes. Today I also explained about hemoglobin A1c and explained that we'll recheck that today and will monitor this in the future as well. - Hemoglobin A1c 08/06/2017: He reports that he has made significant diet changes.  Has stopped all candy and soda.  Is eating a lot of apples.  States that he has lost from 200 pounds to 175 pounds on his scale at home.   (Our scales are reading 181 today but he says at home they are 175. ) Recheck A1c to monitor.  Preventive Care:  05/07/2017---He had CPE with me 02/04/2017---see that note regarding Preventive Care    Signed, [address] [place], [place], [facility] 08/06/2017 11:28 AM

## 2017-08-07 ENCOUNTER — Telehealth: Payer: Self-pay

## 2017-08-07 DIAGNOSIS — D751 Secondary polycythemia: Secondary | ICD-10-CM

## 2017-08-07 LAB — CBC WITH DIFFERENTIAL/PLATELET
Basophils Absolute: 74 cells/uL (ref 0–200)
Basophils Relative: 0.9 %
Eosinophils Absolute: 98 cells/uL (ref 15–500)
Eosinophils Relative: 1.2 %
HEMATOCRIT: 52.5 % — AB (ref 38.5–50.0)
HEMOGLOBIN: 17.7 g/dL — AB (ref 13.2–17.1)
LYMPHS ABS: 1935 {cells}/uL (ref 850–3900)
MCH: 34.1 pg — ABNORMAL HIGH (ref 27.0–33.0)
MCHC: 33.7 g/dL (ref 32.0–36.0)
MCV: 101.2 fL — AB (ref 80.0–100.0)
MPV: 9.5 fL (ref 7.5–12.5)
Monocytes Relative: 8.6 %
NEUTROS ABS: 5387 {cells}/uL (ref 1500–7800)
NEUTROS PCT: 65.7 %
Platelets: 302 10*3/uL (ref 140–400)
RBC: 5.19 10*6/uL (ref 4.20–5.80)
RDW: 11.3 % (ref 11.0–15.0)
Total Lymphocyte: 23.6 %
WBC: 8.2 10*3/uL (ref 3.8–10.8)
WBCMIX: 705 {cells}/uL (ref 200–950)

## 2017-08-07 LAB — LIPID PANEL
Cholesterol: 170 mg/dL (ref <200)
HDL: 47 mg/dL (ref >40)
LDL Cholesterol (Calc): 102 mg/dL (calc) — ABNORMAL HIGH
Non-HDL Cholesterol (Calc): 123 mg/dL (calc) (ref <130)
TRIGLYCERIDES: 111 mg/dL (ref <150)
Total CHOL/HDL Ratio: 3.6 (calc) (ref <5.0)

## 2017-08-07 LAB — COMPLETE METABOLIC PANEL WITH GFR
AG RATIO: 1.7 (calc) (ref 1.0–2.5)
ALT: 26 U/L (ref 9–46)
AST: 27 U/L (ref 10–35)
Albumin: 4.7 g/dL (ref 3.6–5.1)
Alkaline phosphatase (APISO): 52 U/L (ref 40–115)
BILIRUBIN TOTAL: 0.7 mg/dL (ref 0.2–1.2)
BUN: 24 mg/dL (ref 7–25)
CHLORIDE: 99 mmol/L (ref 98–110)
CO2: 30 mmol/L (ref 20–32)
Calcium: 10.4 mg/dL — ABNORMAL HIGH (ref 8.6–10.3)
Creat: 1.17 mg/dL (ref 0.70–1.25)
GFR, EST AFRICAN AMERICAN: 78 mL/min/{1.73_m2} (ref >=60)
GFR, Est Non African American: 67 mL/min/{1.73_m2} (ref >=60)
Globulin: 2.8 g/dL (calc) (ref 1.9–3.7)
Glucose, Bld: 106 mg/dL — ABNORMAL HIGH (ref 65–99)
POTASSIUM: 4.9 mmol/L (ref 3.5–5.3)
Sodium: 138 mmol/L (ref 135–146)
Total Protein: 7.5 g/dL (ref 6.1–8.1)

## 2017-08-07 LAB — HEMOGLOBIN A1C
Hgb A1c MFr Bld: 5.7 % of total Hgb — ABNORMAL HIGH (ref <5.7)
Mean Plasma Glucose: 117 (calc)
eAG (mmol/L): 6.5 (calc)

## 2017-08-07 NOTE — Telephone Encounter (Signed)
Patient aware of lab results as well as referral

## 2017-08-07 NOTE — Telephone Encounter (Signed)
-----   Message from Dorena BodoMary B Dixon, PA-C sent at 08/07/2017  7:40 AM EST ----- Tell him: 1- A1C has decreased from 6.4 to 5.7---with his diet changes---Keep up the good work!!!  2- Have him make sure that he is taking the Crestor every day and that he has not accidentally stopped it or something--- 01/2017--LDL was 56. Now it is 102. We made no change to this med 01/2017 so this doesn't make sense--take Crestor daily 3. Lab shows Polycythemia---Too much red blood cells---he needs to see Blood doctor PLACE ORDER FOR REFERRAL TO [facility]

## 2017-08-24 ENCOUNTER — Inpatient Hospital Stay (HOSPITAL_COMMUNITY): Payer: 59 | Attending: Hematology and Oncology | Admitting: Hematology and Oncology

## 2017-08-24 ENCOUNTER — Encounter (HOSPITAL_COMMUNITY): Payer: Self-pay | Admitting: Hematology and Oncology

## 2017-08-24 ENCOUNTER — Inpatient Hospital Stay (HOSPITAL_COMMUNITY): Payer: 59

## 2017-08-24 ENCOUNTER — Ambulatory Visit (HOSPITAL_COMMUNITY): Payer: 59 | Admitting: Hematology and Oncology

## 2017-08-24 ENCOUNTER — Other Ambulatory Visit: Payer: Self-pay

## 2017-08-24 VITALS — BP 209/85 | HR 72 | Temp 98.8°F | Resp 18 | Ht 70.0 in | Wt 183.0 lb

## 2017-08-24 DIAGNOSIS — D7589 Other specified diseases of blood and blood-forming organs: Secondary | ICD-10-CM | POA: Diagnosis not present

## 2017-08-24 DIAGNOSIS — Z809 Family history of malignant neoplasm, unspecified: Secondary | ICD-10-CM | POA: Insufficient documentation

## 2017-08-24 DIAGNOSIS — F419 Anxiety disorder, unspecified: Secondary | ICD-10-CM | POA: Diagnosis not present

## 2017-08-24 DIAGNOSIS — Z951 Presence of aortocoronary bypass graft: Secondary | ICD-10-CM

## 2017-08-24 DIAGNOSIS — F1210 Cannabis abuse, uncomplicated: Secondary | ICD-10-CM | POA: Diagnosis not present

## 2017-08-24 DIAGNOSIS — I10 Essential (primary) hypertension: Secondary | ICD-10-CM | POA: Insufficient documentation

## 2017-08-24 DIAGNOSIS — K219 Gastro-esophageal reflux disease without esophagitis: Secondary | ICD-10-CM | POA: Diagnosis not present

## 2017-08-24 DIAGNOSIS — E785 Hyperlipidemia, unspecified: Secondary | ICD-10-CM

## 2017-08-24 DIAGNOSIS — I2510 Atherosclerotic heart disease of native coronary artery without angina pectoris: Secondary | ICD-10-CM | POA: Insufficient documentation

## 2017-08-24 DIAGNOSIS — Z8673 Personal history of transient ischemic attack (TIA), and cerebral infarction without residual deficits: Secondary | ICD-10-CM | POA: Diagnosis not present

## 2017-08-24 DIAGNOSIS — D582 Other hemoglobinopathies: Secondary | ICD-10-CM

## 2017-08-24 DIAGNOSIS — F17228 Nicotine dependence, chewing tobacco, with other nicotine-induced disorders: Secondary | ICD-10-CM

## 2017-08-24 DIAGNOSIS — Z87891 Personal history of nicotine dependence: Secondary | ICD-10-CM | POA: Diagnosis not present

## 2017-08-24 DIAGNOSIS — Z79899 Other long term (current) drug therapy: Secondary | ICD-10-CM | POA: Diagnosis not present

## 2017-08-24 DIAGNOSIS — D751 Secondary polycythemia: Secondary | ICD-10-CM | POA: Insufficient documentation

## 2017-08-24 DIAGNOSIS — R5383 Other fatigue: Secondary | ICD-10-CM | POA: Insufficient documentation

## 2017-08-24 DIAGNOSIS — Z7982 Long term (current) use of aspirin: Secondary | ICD-10-CM | POA: Insufficient documentation

## 2017-08-24 DIAGNOSIS — F411 Generalized anxiety disorder: Secondary | ICD-10-CM

## 2017-08-24 LAB — COMPREHENSIVE METABOLIC PANEL
ALBUMIN: 4.7 g/dL (ref 3.5–5.0)
ALT: 28 U/L (ref 17–63)
ANION GAP: 13 (ref 5–15)
AST: 28 U/L (ref 15–41)
Alkaline Phosphatase: 48 U/L (ref 38–126)
BUN: 22 mg/dL — ABNORMAL HIGH (ref 6–20)
CHLORIDE: 99 mmol/L — AB (ref 101–111)
CO2: 26 mmol/L (ref 22–32)
Calcium: 9.8 mg/dL (ref 8.9–10.3)
Creatinine, Ser: 1.05 mg/dL (ref 0.61–1.24)
GFR calc Af Amer: >60 mL/min (ref >60)
GFR calc non Af Amer: >60 mL/min (ref >60)
GLUCOSE: 110 mg/dL — AB (ref 65–99)
POTASSIUM: 4 mmol/L (ref 3.5–5.1)
SODIUM: 138 mmol/L (ref 135–145)
TOTAL PROTEIN: 8.2 g/dL — AB (ref 6.5–8.1)
Total Bilirubin: 0.6 mg/dL (ref 0.3–1.2)

## 2017-08-24 LAB — CBC WITH DIFFERENTIAL/PLATELET
BASOS ABS: 0 10*3/uL (ref 0.0–0.1)
Basophils Relative: 0 %
Eosinophils Absolute: 0.1 10*3/uL (ref 0.0–0.7)
Eosinophils Relative: 0 %
HEMATOCRIT: 50.6 % (ref 39.0–52.0)
Hemoglobin: 16.9 g/dL (ref 13.0–17.0)
LYMPHS ABS: 2.4 10*3/uL (ref 0.7–4.0)
LYMPHS PCT: 17 %
MCH: 33.4 pg (ref 26.0–34.0)
MCHC: 33.4 g/dL (ref 30.0–36.0)
MCV: 100 fL (ref 78.0–100.0)
MONO ABS: 1.1 10*3/uL — AB (ref 0.1–1.0)
MONOS PCT: 8 %
NEUTROS ABS: 10.5 10*3/uL — AB (ref 1.7–7.7)
Neutrophils Relative %: 75 %
Platelets: 252 10*3/uL (ref 150–400)
RBC: 5.06 MIL/uL (ref 4.22–5.81)
RDW: 11.3 % — AB (ref 11.5–15.5)
WBC: 14.2 10*3/uL — ABNORMAL HIGH (ref 4.0–10.5)

## 2017-08-24 LAB — LACTATE DEHYDROGENASE: LDH: 151 U/L (ref 98–192)

## 2017-08-24 LAB — TSH: TSH: 0.744 u[IU]/mL (ref 0.350–4.500)

## 2017-08-25 ENCOUNTER — Other Ambulatory Visit: Payer: Self-pay | Admitting: *Deleted

## 2017-08-25 DIAGNOSIS — I350 Nonrheumatic aortic (valve) stenosis: Secondary | ICD-10-CM

## 2017-08-25 LAB — ERYTHROPOIETIN: Erythropoietin: 6.5 m[IU]/mL (ref 2.6–18.5)

## 2017-08-26 ENCOUNTER — Ambulatory Visit (HOSPITAL_COMMUNITY): Payer: 59 | Admitting: Hematology and Oncology

## 2017-08-28 ENCOUNTER — Ambulatory Visit (HOSPITAL_COMMUNITY): Payer: 59 | Admitting: Internal Medicine

## 2017-08-31 ENCOUNTER — Ambulatory Visit (HOSPITAL_COMMUNITY): Payer: 59 | Admitting: Hematology and Oncology

## 2017-09-03 DIAGNOSIS — D582 Other hemoglobinopathies: Secondary | ICD-10-CM | POA: Insufficient documentation

## 2017-09-03 NOTE — Assessment & Plan Note (Signed)
62 y.o. male with erythrocytosis that appears to be new developing between June and December of this year.  Concurrently, there has been some macrocytosis development without leukocytosis or thrombocythemia.  Differential includes primary or secondary erythrocytosis.  We will start evaluation by repeating lab work including erythropoietin.  Due to fatigue and macrocytosis will also obtain TSH level.  Plan: -- Labs today as outlined below. -- Return to our clinic in 1 week to review the findings.  If erythropoietin level is suppressed, will pursue primary erythrocytosis assessment such as myeloproliferative neoplasm with mutational testing and bone marrow biopsy.  If erythropoietin is upper normal or elevated, we will pursue  Secondary erythrocytosis evaluation including pulmonary function testing, imaging of the chest, abdomen, and pelvis looking for emphysema, hepatic, or renal malignancy.

## 2017-09-03 NOTE — Progress Notes (Signed)
[facility] New Visit:  Assessment: Elevated hemoglobin [facility]) 62 y.o. male with erythrocytosis that appears to be new developing between June and December of this year.  Concurrently, there has been some macrocytosis development without leukocytosis or thrombocythemia.  Differential includes primary or secondary erythrocytosis.  We will start evaluation by repeating lab work including erythropoietin.  Due to fatigue and macrocytosis will also obtain TSH level.  Plan: -- Labs today as outlined below. -- Return to our clinic in 1 week to review the findings.  If erythropoietin level is suppressed, will pursue primary erythrocytosis assessment such as myeloproliferative neoplasm with mutational testing and bone marrow biopsy.  If erythropoietin is upper normal or elevated, we will pursue  Secondary erythrocytosis evaluation including pulmonary function testing, imaging of the chest, abdomen, and pelvis looking for emphysema, hepatic, or renal malignancy.  Voice recognition software was used and creation of this note. Despite my best effort at editing the text, some misspelling/errors may have occurred.   Orders Placed This Encounter  Procedures  . CBC with Differential (Cancer Center Only)    Standing Status:   Future    Standing Expiration Date:   08/24/2018  . CMP ([facility] only)    Standing Status:   Future    Standing Expiration Date:   08/24/2018  . Lactate dehydrogenase (LDH)    Standing Status:   Future    Number of Occurrences:   1    Standing Expiration Date:   08/24/2018  . Erythropoietin    Standing Status:   Future    Number of Occurrences:   1    Standing Expiration Date:   08/24/2018  . TSH    Standing Status:   Future    Number of Occurrences:   1    Standing Expiration Date:   08/24/2018  . CBC with Differential    Standing Status:   Future    Number of Occurrences:   1    Standing Expiration Date:   08/24/2018  . Comprehensive metabolic panel     Standing Status:   Future    Number of Occurrences:   1    Standing Expiration Date:   08/24/2018    All questions were answered.  . The patient knows to call the clinic with any problems, questions or concerns.  This note was electronically signed.    History of Presenting Illness Samuel Cook 62 y.o. presenting to the [facility] for polycythemia evaluation, referred by Orlena Sheldon, PA-C.  Patient's past medical history is significant for hypertension, hyperlipidemia, anxiety, and GERD.  Patient has 20-pack-year smoking history which she quit in 1995.  He still uses snuff and 2, uses occasional alcohol and has history of cannabis and codeine abuse.  He is working as a Nutritional therapist   With his job coming with significant exposure to fumes and solvents.  Family history significant for mother with unidentified cancer in father who has suffered a stroke.  Patient's initial presentation is complicated by severe hypertension without neurological symptoms.  At the present time, patient reports feeling well with no fevers, chills, night sweats.  No unexpected weight loss, activity tolerance change, or change in appetite.  Denies any active shortness of breath or chest pain.  No flushing of the face or palms.  Denies abdominal pain, early satiety, diarrhea, or constipation.  No neurological deficits.  Oncological/hematological History: --Labs, 02/02/17: WBC 11.4, Hgb 14.7, Hct 46.0, MCV   95.2, MCH 30.4, RDW  13.6, Plt 357; --Labs, 08/06/17: WBC   8.2, Hgb 17.7, Hct 52.5, MCV 101.2, MCH 34.1, RDW 11.3, Plt 302;  Medical History: Past Medical History:  Diagnosis Date  . Anxiety   . Coronary artery disease   . GERD (gastroesophageal reflux disease)    occasionally  . Heart murmur    dx 1.5 mths ago.  No symptoms  . Hyperlipidemia   . Hypertension     Surgical History: Past Surgical History:  Procedure Laterality Date  . CARDIAC CATHETERIZATION    . CORONARY ARTERY BYPASS GRAFT N/A  12/26/2016   Procedure: CORONARY ARTERY BYPASS GRAFTING (CABG) x 3 WITH ENDOSCOPIC HARVESTING OF RIGHT SAPHENOUS VEIN;  Surgeon: Grace Isaac, MD;  Location: [place];  Service: Open Heart Surgery;  Laterality: N/A;  . LEFT HEART CATH AND CORONARY ANGIOGRAPHY N/A 12/12/2016   Procedure: Left Heart Cath and Coronary Angiography;  Surgeon: Belva Crome, MD;  Location: [place] CV LAB;  Service: Cardiovascular;  Laterality: N/A;  . none     none  . TEE WITHOUT CARDIOVERSION N/A 12/26/2016   Procedure: TRANSESOPHAGEAL ECHOCARDIOGRAM (TEE);  Surgeon: Grace Isaac, MD;  Location: [place];  Service: Open Heart Surgery;  Laterality: N/A;    Family History: Family History  Problem Relation Age of Onset  . Cancer Mother   . Early death Mother   . Stroke Father   . Birth defects Maternal Aunt   . Hyperlipidemia Maternal Aunt   . Hearing loss Maternal Uncle   . Heart disease Maternal Grandmother     Social History: Social History   Socioeconomic History  . Marital status: Married    Spouse name: Not on file  . Number of children: Not on file  . Years of education: Not on file  . Highest education level: Not on file  Social Needs  . Financial resource strain: Not on file  . Food insecurity - worry: Not on file  . Food insecurity - inability: Not on file  . Transportation needs - medical: Not on file  . Transportation needs - non-medical: Not on file  Occupational History  . Not on file  Tobacco Use  . Smoking status: Former Smoker    Packs/day: 1.00    Years: 20.00    Pack years: 20.00    Last attempt to quit: 10/20/1993    Years since quitting: 23.8  . Smokeless tobacco: Current User    Types: Snuff, Chew  Substance and Sexual Activity  . Alcohol use: Yes    Comment: rare   . Drug use: Yes    Types: Marijuana, Codeine    Comment: occasionally....takes 1 puff when really stressed  . Sexual activity: Yes  Other Topics Concern  . Not on file  Social History Narrative   . Not on file    Allergies: Allergies  Allergen Reactions  . No Known Allergies   . Norvasc [Amlodipine Besylate] Other (See Comments)    Headache    Medications:  Current Outpatient Medications  Medication Sig Dispense Refill  . acetaminophen (TYLENOL) 500 MG tablet Take 1,000 mg by mouth 2 (two) times daily.    Marland Kitchen aspirin EC 325 MG EC tablet Take 1 tablet (325 mg total) by mouth daily. 30 tablet 0  . chlorthalidone (HYGROTON) 25 MG tablet Take 0.5 tablets (12.5 mg total) by mouth daily. 45 tablet 3  . lisinopril (PRINIVIL,ZESTRIL) 20 MG tablet Take 1 tablet (20 mg total) by mouth 2 (two) times daily. 30 tablet 11  .  LORazepam (ATIVAN) 0.5 MG tablet TAKE 1 TABLET BY MOUTH TWICE DAILY AS NEEDED FOR ANXIETY. 60 tablet 2  . nebivolol (BYSTOLIC) 10 MG tablet Take 1 tablet (10 mg total) by mouth daily. 30 tablet 11  . rosuvastatin (CRESTOR) 20 MG tablet TAKE (1) TABLET BY MOUTH ONCE DAILY. 30 tablet 0   No current facility-administered medications for this visit.     Review of Systems: Review of Systems  Cardiovascular: Positive for leg swelling.  All other systems reviewed and are negative.    PHYSICAL EXAMINATION Blood pressure (!) 209/85, pulse 72, temperature 98.8 F (37.1 C), temperature source Oral, resp. rate 18, height '5\' 10"'  (1.778 m), weight 183 lb (83 kg), SpO2 99 %.  ECOG PERFORMANCE STATUS: 1 - Symptomatic but completely ambulatory  Physical Exam  Constitutional: He is oriented to person, place, and time and well-developed, well-nourished, and in no distress. No distress.  HENT:  Head: Normocephalic and atraumatic.  Mouth/Throat: Oropharynx is clear and moist. No oropharyngeal exudate.  Eyes: Conjunctivae and EOM are normal. Pupils are equal, round, and reactive to light. No scleral icterus.  Neck: No thyromegaly present.  Cardiovascular: Normal rate, regular rhythm and normal heart sounds.  No murmur heard. Pulmonary/Chest: Effort normal and breath sounds  normal. No respiratory distress. He has no wheezes. He has no rales.  Abdominal: Soft. Bowel sounds are normal. He exhibits no distension. There is no tenderness. There is no rebound.  Musculoskeletal: He exhibits no edema.  Lymphadenopathy:    He has no cervical adenopathy.  Neurological: He is alert and oriented to person, place, and time. He has normal reflexes. No cranial nerve deficit.  Skin: Skin is warm and dry. No rash noted. He is not diaphoretic. No erythema.     LABORATORY DATA: I have personally reviewed the data as listed: Office Visit on 08/24/2017  Component Date Value Ref Range Status  . TSH 08/24/2017 0.744  0.350 - 4.500 uIU/mL Final   Performed by a 3rd Generation assay with a functional sensitivity of <=0.01 uIU/mL.  Marland Kitchen Erythropoietin 08/24/2017 6.5  2.6 - 18.5 mIU/mL Final   Comment: (NOTE) [employer] UniCel DxI 800 Immunoassay System Performed At: [facility] [place], [place] [id number] Rush Farmer MD [id number]   . LDH 08/24/2017 151  98 - 192 U/L Final  . Sodium 08/24/2017 138  135 - 145 mmol/L Final  . Potassium 08/24/2017 4.0  3.5 - 5.1 mmol/L Final  . Chloride 08/24/2017 99* 101 - 111 mmol/L Final  . CO2 08/24/2017 26  22 - 32 mmol/L Final  . Glucose, Bld 08/24/2017 110* 65 - 99 mg/dL Final  . BUN 08/24/2017 22* 6 - 20 mg/dL Final  . Creatinine, Ser 08/24/2017 1.05  0.61 - 1.24 mg/dL Final  . Calcium 08/24/2017 9.8  8.9 - 10.3 mg/dL Final  . Total Protein 08/24/2017 8.2* 6.5 - 8.1 g/dL Final  . Albumin 08/24/2017 4.7  3.5 - 5.0 g/dL Final  . AST 08/24/2017 28  15 - 41 U/L Final  . ALT 08/24/2017 28  17 - 63 U/L Final  . Alkaline Phosphatase 08/24/2017 48  38 - 126 U/L Final  . Total Bilirubin 08/24/2017 0.6  0.3 - 1.2 mg/dL Final  . GFR calc non Af Amer 08/24/2017 >60  >60 mL/min Final  . GFR calc Af Amer 08/24/2017 >60  >60 mL/min Final   Comment: (NOTE) The eGFR has been calculated using the CKD EPI equation. This  calculation has not been validated  in all clinical situations. eGFR's persistently <60 mL/min signify possible Chronic Kidney Disease.   . Anion gap 08/24/2017 13  5 - 15 Final  . WBC 08/24/2017 14.2* 4.0 - 10.5 K/uL Final  . RBC 08/24/2017 5.06  4.22 - 5.81 MIL/uL Final  . Hemoglobin 08/24/2017 16.9  13.0 - 17.0 g/dL Final  . HCT 08/24/2017 50.6  39.0 - 52.0 % Final  . MCV 08/24/2017 100.0  78.0 - 100.0 fL Final  . MCH 08/24/2017 33.4  26.0 - 34.0 pg Final  . MCHC 08/24/2017 33.4  30.0 - 36.0 g/dL Final  . RDW 08/24/2017 11.3* 11.5 - 15.5 % Final  . Platelets 08/24/2017 252  150 - 400 K/uL Final  . Neutrophils Relative % 08/24/2017 75  % Final  . Neutro Abs 08/24/2017 10.5* 1.7 - 7.7 K/uL Final  . Lymphocytes Relative 08/24/2017 17  % Final  . Lymphs Abs 08/24/2017 2.4  0.7 - 4.0 K/uL Final  . Monocytes Relative 08/24/2017 8  % Final  . Monocytes Absolute 08/24/2017 1.1* 0.1 - 1.0 K/uL Final  . Eosinophils Relative 08/24/2017 0  % Final  . Eosinophils Absolute 08/24/2017 0.1  0.0 - 0.7 K/uL Final  . Basophils Relative 08/24/2017 0  % Final  . Basophils Absolute 08/24/2017 0.0  0.0 - 0.1 K/uL Final         Ardath Sax, MD

## 2017-09-10 ENCOUNTER — Encounter: Payer: Self-pay | Admitting: Student

## 2017-09-10 NOTE — Progress Notes (Signed)
Cardiology Office Note    Date:  09/11/2017   ID:  Samuel Cook, Samuel Cook 05-04-1956, MRN [id number]  PCP:  Dorena Bodo, PA-C  Cardiologist: Dr. Eden Emms   Chief Complaint  Patient presents with  . Follow-up    6 month visit    History of Present Illness:    Samuel Cook is a 62 y.o. male with past medical history of CAD (s/p CABG in 12/2016 with LIMA-LAD, reverse SVG-OM, and reverse SVG-PDA), aortic stenosis (mild to moderate by echo in 10/2016), HTN, and HLD who presents to the office today for 104-month follow-up.   He was last examined by Joni Reining, DNP in 02/2017 and denied any recent chest pain or dyspnea on exertion. BP was elevated to 166/82 at that time, therefore he was started on Chlorthalidone 12.5mg  daily and continued on Amlodipine 10mg  daily, Lisinopril 20mg  BID, and Bystolic 10mg  daily. A repeat echocardiogram was recommended in 6 months to reassess his aortic valve.   In talking with the patient today, he reports overall doing well since his last office visit. He denies any recent chest discomfort, dyspnea on exertion, orthopnea, PND, lower extremity edema, lightheadedness, dizziness, or presyncope.  He initially reports that his blood pressure has been well controlled recently but upon looking at past records this was elevated at 209/85 on 08/24/2017 and systolic readings were in the 782'N in 07/2017. Blood pressure is elevated initially at 178/90 during today's visit, improved to 164/84 on recheck. He was previously on Amlodipine as well but developed headaches with this.   Past Medical History:  Diagnosis Date  . Anxiety   . Aortic stenosis   . Coronary artery disease    a. s/p CABG in 12/2016 with LIMA-LAD, reverse SVG-OM, and reverse SVG-PDA  . GERD (gastroesophageal reflux disease)    occasionally  . Hyperlipidemia   . Hypertension     Past Surgical History:  Procedure Laterality Date  . CARDIAC CATHETERIZATION    . CORONARY ARTERY BYPASS GRAFT N/A  12/26/2016   Procedure: CORONARY ARTERY BYPASS GRAFTING (CABG) x 3 WITH ENDOSCOPIC HARVESTING OF RIGHT SAPHENOUS VEIN;  Surgeon: Delight Ovens, MD;  Location[facility];  Service: Open Heart Surgery;  Laterality: N/A;  . LEFT HEART CATH AND CORONARY ANGIOGRAPHY N/A 12/12/2016   Procedure: Left Heart Cath and Coronary Angiography;  Surgeon: Lyn Records, MD;  Location: [facility] INVASIVE CV LAB;  Service: Cardiovascular;  Laterality: N/A;  . none     none  . TEE WITHOUT CARDIOVERSION N/A 12/26/2016   Procedure: TRANSESOPHAGEAL ECHOCARDIOGRAM (TEE);  Surgeon: Delight Ovens, MD;  Location: [facility] OR;  Service: Open Heart Surgery;  Laterality: N/A;    Current Medications: Outpatient Medications Prior to Visit  Medication Sig Dispense Refill  . acetaminophen (TYLENOL) 500 MG tablet Take 1,000 mg by mouth 2 (two) times daily.    Marland Kitchen aspirin EC 325 MG EC tablet Take 1 tablet (325 mg total) by mouth daily. 30 tablet 0  . LORazepam (ATIVAN) 0.5 MG tablet TAKE 1 TABLET BY MOUTH TWICE DAILY AS NEEDED FOR ANXIETY. 60 tablet 2  . rosuvastatin (CRESTOR) 20 MG tablet TAKE (1) TABLET BY MOUTH ONCE DAILY. 30 tablet 0  . chlorthalidone (HYGROTON) 25 MG tablet Take 0.5 tablets (12.5 mg total) by mouth daily. 45 tablet 3  . lisinopril (PRINIVIL,ZESTRIL) 20 MG tablet Take 1 tablet (20 mg total) by mouth 2 (two) times daily. 30 tablet 11  . nebivolol (BYSTOLIC) 10 MG tablet Take 1 tablet (  10 mg total) by mouth daily. 30 tablet 11   No facility-administered medications prior to visit.      Allergies:   No known allergies and Norvasc [amlodipine besylate]   Social History   Socioeconomic History  . Marital status: Married    Spouse name: None  . Number of children: None  . Years of education: None  . Highest education level: None  Social Needs  . Financial resource strain: None  . Food insecurity - worry: None  . Food insecurity - inability: None  . Transportation needs - medical: None  . Transportation needs  - non-medical: None  Occupational History  . None  Tobacco Use  . Smoking status: Former Smoker    Packs/day: 1.00    Years: 20.00    Pack years: 20.00    Types: Cigarettes    Last attempt to quit: 10/20/1993    Years since quitting: 23.9  . Smokeless tobacco: Current User    Types: Snuff  Substance and Sexual Activity  . Alcohol use: Yes    Comment: rare   . Drug use: Yes    Types: Marijuana, Codeine    Comment: occasionally....takes 1 puff when really stressed  . Sexual activity: Yes  Other Topics Concern  . None  Social History Narrative  . None     Family History:  The patient's family history includes Birth defects in his maternal aunt; Cancer in his mother; Early death in his mother; Hearing loss in his maternal uncle; Heart disease in his maternal grandmother; Hyperlipidemia in his maternal aunt; Stroke in his father.   Review of Systems:   Please see the history of present illness.     General:  No chills, fever, night sweats or weight changes.  Cardiovascular:  No chest pain, dyspnea on exertion, edema, orthopnea, palpitations, paroxysmal nocturnal dyspnea. Dermatological: No rash, lesions/masses Respiratory: No cough, dyspnea Urologic: No hematuria, dysuria Abdominal:   No nausea, vomiting, diarrhea, bright red blood per rectum, melena, or hematemesis Neurologic:  No visual changes, wkns, changes in mental status. Positive for headaches (improved with medication changes as listed above).   All other systems reviewed and are otherwise negative except as noted above.   Physical Exam:    VS:  BP (!) 164/84   Pulse 60   Ht 5\' 10"  (1.778 m)   Wt 187 lb (84.8 kg)   SpO2 97%   BMI 26.83 kg/m    General: Well developed, well nourished Caucasian male appearing in no acute distress. Head: Normocephalic, atraumatic, sclera non-icteric, no xanthomas, nares are without discharge.  Neck: No carotid bruits. JVD not elevated.  Lungs: Respirations regular and unlabored,  without wheezes or rales.  Heart: Regular rate and rhythm. No S3 or S4.  No rubs or gallops appreciated. 2/6 SEM along RUSB.  Abdomen: Soft, non-tender, non-distended with normoactive bowel sounds. No hepatomegaly. No rebound/guarding. No obvious abdominal masses. Msk:  Strength and tone appear normal for age. No joint deformities or effusions. Extremities: No clubbing or cyanosis. No lower extremity edema.  Distal pedal pulses are 2+ bilaterally. Neuro: Alert and oriented X 3. Moves all extremities spontaneously. No focal deficits noted. Psych:  Responds to questions appropriately with a normal affect. Skin: No rashes or lesions noted  Wt Readings from Last 3 Encounters:  09/11/17 187 lb (84.8 kg)  08/24/17 183 lb (83 kg)  08/06/17 181 lb 3.2 oz (82.2 kg)     Studies/Labs Reviewed:   EKG:  EKG is not ordered today.  Recent Labs: 12/27/2016: Magnesium 2.1 08/24/2017: ALT 28; BUN 22; Creatinine, Ser 1.05; Hemoglobin 16.9; Platelets 252; Potassium 4.0; Sodium 138; TSH 0.744   Lipid Panel    Component Value Date/Time   CHOL 170 08/06/2017 0803   TRIG 111 08/06/2017 0803   HDL 47 08/06/2017 0803   CHOLHDL 3.6 08/06/2017 0803   VLDL 19 02/02/2017 0839   LDLCALC 56 02/02/2017 0839    Additional studies/ records that were reviewed today include:   TEE: 12/2016  Tricuspid valve: Trace regurgitation. The tricuspid valve regurgitation jet is central.  Mitral valve: Mild leaflet thickening is present. Mild mitral annular calcification. Trace regurgitation.  Aortic valve: The valve is trileaflet. Moderate valve thickening present. Moderate valve calcification present. Moderately decreased leaflet separation. Mild to moderate stenosis. Trace regurgitation.  Aorta: The ascending aorta is mildly dilated.  Right ventricle: Normal cavity size, wall thickness and ejection fraction.  Septum: No Patent Foramen Ovale present.  Left atrium: Patent foramen ovale not present.  Left  ventricle: Normal cavity size. Concentric hypertrophy. LV systolic function is normal with an EF of 65-70%. There are no obvious wall motion abnormalities.  Left ventricle: Normal cavity size.   Cardiac Catheterization: 11/2016  Heavy coronary calcification throughout the left main and proximal to mid LAD.  Tubular 55-65% distal left main  Ostial 75% LAD  Ostial 90% first obtuse marginal  Nondominant right coronary with ostial 40-50% narrowing  Mild aortic stenosis with peak to peak gradient of 20 mmHg. Heavy calcification on echocardiography.  Normal LV systolic function with mildly elevated end-diastolic pressure consistent with diastolic heart failure.  RECOMMENDATION:  With the accompanying abnormal nuclear study demonstrating anterior wall and apical ischemia, the patient will be referred to [facility] for coronary bypass grafting given distal left main and ostial LAD involvement.   The aortic valve is diseased but not severely stenosed. Recent echo did not demonstrate any significant aortic regurgitation. LV size and function were normal. It does not appear that valve replacement/therapy is indicated at this time but should be debated.  Echocardiogram: 09/11/2017 Study Conclusions  - Left ventricle: The cavity size was normal. Wall thickness was   increased in a pattern of moderate LVH. Systolic function was   normal. The estimated ejection fraction was in the range of 55%   to 60%. Abnormal diastolic function, indeterminant grade. Wall   motion was normal; there were no regional wall motion   abnormalities. - Mitral valve: There was mild regurgitation. - Aortic valve: Moderately to severely calcified annulus. Severely   thickened leaflets. Morphologically the valve appears to have   moderate to severe AS. The CW of the AV is mildly off axis, I   suspect the true mean gradient is in the moderate range. There   was mild to moderate stenosis by quantitative data Mean gradient    (S): 17 mm Hg. VTI ratio of LVOT to aortic valve: 0.35. Valve   area (VTI): 1.33 cm^2. Valve area (Vmax): 1.3 cm^2. Valve area   (Vmean): 1.31 cm^2. - Left atrium: The atrium was mildly dilated. - Technically adequate study.  Assessment:    1. Coronary artery disease involving native coronary artery of native heart without angina pectoris   2. Hx of CABG   3. Nonrheumatic aortic valve stenosis   4. Essential hypertension   5. Hyperlipidemia LDL goal <70      Plan:   In order of problems listed above:  1. CAD - he is s/p CABG in 12/2016 with LIMA-LAD, reverse  SVG-OM, and reverse SVG-PDA. He denies any recent chest pain or dyspnea on exertion.  - repeat echo from earlier today shows a preserved EF of 55-60% with no regional WMA. No indication for further ischemic evaluation at this time.  - continue ASA, statin, and BB therapy.   2. Aortic Stenosis - previously mild to moderate by echo in 10/2016. Repeat echo from earlier today shows severely thickened leaflets and morphologically the valve appears to have moderate to severe AS with mild to moderate stenosis by quantitative data and a mean gradient of (S): 17 mm Hg.  - he denies any recent dyspnea, chest pain, lightheadedness, or presyncope. Will continue to follow with routine echocardiograms.   3. HTN - BP initially elevated at 178/90 during today's visit, improved to 164/84 on recheck. - currently on Chlorthalidone 12.5mg  daily, Lisinopril 20mg  BID, and Bystolic 10mg  daily. Will titrate Bystolic to 20mg  daily. Consider further titration of Chlorthalidone if additional BP control is needed.   4. HLD - followed by PCP. Goal LDL is < 70 with known CAD. Remains on Crestor 20mg  daily.    Medication Adjustments/Labs and Tests Ordered: Current medicines are reviewed at length with the patient today.  Concerns regarding medicines are outlined above.  Medication changes, Labs and Tests ordered today are listed in the Patient  Instructions below. Patient Instructions  Medication Instructions:   Your physician has recommended you make the following change in your medication:   Increase bystolic to 20 mg by mouth daily. You may take (2) of your 10 mg by mouth until they are finished.  Continue all other medications the same.  Labwork:  NONE  Testing/Procedures:  NONE  Follow-Up:  Your physician recommends that you schedule a follow-up appointment in: 4 months. You will receive a reminder letter in the mail in about 2 months reminding you to call and schedule your appointment. If you don't receive this letter, please contact our office.  Any Other Special Instructions Will Be Listed Below (If Applicable).  If you need a refill on your cardiac medications before your next appointment, please call your pharmacy.    Signed, Ellsworth Lennox, PA-C  09/11/2017 7:54 PM    [facility] [address] [place], [place] [address] Phone: [phone]

## 2017-09-11 ENCOUNTER — Other Ambulatory Visit: Payer: Self-pay

## 2017-09-11 ENCOUNTER — Ambulatory Visit (HOSPITAL_COMMUNITY)
Admission: RE | Admit: 2017-09-11 | Discharge: 2017-09-11 | Disposition: A | Discharge status: Home / Self Care | Payer: 59 | Source: Ambulatory Visit | Attending: Student | Admitting: Student

## 2017-09-11 ENCOUNTER — Ambulatory Visit: Payer: 59 | Admitting: Student

## 2017-09-11 ENCOUNTER — Encounter: Payer: Self-pay | Admitting: Student

## 2017-09-11 VITALS — BP 164/84 | HR 60 | Ht 70.0 in | Wt 187.0 lb

## 2017-09-11 DIAGNOSIS — I350 Nonrheumatic aortic (valve) stenosis: Secondary | ICD-10-CM

## 2017-09-11 DIAGNOSIS — I2510 Atherosclerotic heart disease of native coronary artery without angina pectoris: Secondary | ICD-10-CM

## 2017-09-11 DIAGNOSIS — E785 Hyperlipidemia, unspecified: Secondary | ICD-10-CM | POA: Diagnosis not present

## 2017-09-11 DIAGNOSIS — I420 Dilated cardiomyopathy: Secondary | ICD-10-CM | POA: Diagnosis not present

## 2017-09-11 DIAGNOSIS — Z951 Presence of aortocoronary bypass graft: Secondary | ICD-10-CM

## 2017-09-11 DIAGNOSIS — I10 Essential (primary) hypertension: Secondary | ICD-10-CM | POA: Diagnosis not present

## 2017-09-11 DIAGNOSIS — I5030 Unspecified diastolic (congestive) heart failure: Secondary | ICD-10-CM | POA: Insufficient documentation

## 2017-09-11 DIAGNOSIS — I080 Rheumatic disorders of both mitral and aortic valves: Secondary | ICD-10-CM | POA: Diagnosis not present

## 2017-09-11 LAB — ECHOCARDIOGRAM COMPLETE
AO mean calculated velocity dopler: 186 cm/s
AV Area VTI index: 0.65 cm2/m2
AV Area VTI: 1.3 cm2
AV Area mean vel: 1.31 cm2
AV Mean grad: 17 mmHg
AV Peak grad: 32 mmHg
AV area mean vel ind: 0.64 cm2/m2
AV peak Index: 0.64
AV vel: 1.33
AVA: 1.33 cm2
AVCELMEANRAT: 0.34
AVLVOTPG: 4 mmHg
AVPKVEL: 284 cm/s
Ao pk vel: 0.34 m/s
CHL CUP STROKE VOLUME: 63 mL
E decel time: 239 msec
EERAT: 16.04
FS: 29 % (ref 28–44)
IV/PV OW: 1.25
LA diam index: 1.72 cm/m2
LA vol index: 25.7 mL/m2
LASIZE: 35 mm
LAVOL: 52.4 mL
LAVOLA4C: 58.3 mL
LEFT ATRIUM END SYS DIAM: 35 mm
LV E/e' medial: 16.04
LV TDI E'MEDIAL: 5.66
LV dias vol: 98 mL (ref 62–150)
LV sys vol index: 17 mL/m2
LV sys vol: 35 mL
LVDIAVOLIN: 48 mL/m2
LVEEAVG: 16.04
LVELAT: 6.42 cm/s
LVOT VTI: 24 cm
LVOT area: 3.8 cm2
LVOT diameter: 22 mm
LVOT peak VTI: 0.35 cm
LVOTPV: 97 cm/s
LVOTSV: 91 mL
MV Dec: 239
MV pk E vel: 103 m/s
MVPG: 4 mmHg
MVPKAVEL: 76.4 m/s
PW: 10 mm — AB (ref 0.6–1.1)
RV LATERAL S' VELOCITY: 9.68 cm/s
RV TAPSE: 19.1 mm
Simpson's disk: 65
TDI e' lateral: 6.42
VTI: 68.4 cm
Valve area index: 0.65

## 2017-09-11 MED ORDER — CHLORTHALIDONE 25 MG PO TABS: 12.5000 mg | ORAL_TABLET | Freq: Every day | ORAL | 3 refills | Status: DC

## 2017-09-11 MED ORDER — NEBIVOLOL HCL 20 MG PO TABS: 20.0000 mg | ORAL_TABLET | Freq: Every day | ORAL | 3 refills | Status: DC

## 2017-09-11 MED ORDER — LISINOPRIL 20 MG PO TABS: 20.0000 mg | ORAL_TABLET | Freq: Two times a day (BID) | ORAL | 3 refills | Status: DC

## 2017-09-11 NOTE — Progress Notes (Signed)
*  PRELIMINARY RESULTS* Echocardiogram 2D Echocardiogram has been performed.  Stacey DrainWhite, Julane Crock J 09/11/2017, 12:22 PM

## 2017-09-11 NOTE — Patient Instructions (Signed)
Medication Instructions:   Your physician has recommended you make the following change in your medication:   Increase bystolic to 20 mg by mouth daily. You may take (2) of your 10 mg by mouth until they are finished.  Continue all other medications the same.  Labwork:  NONE  Testing/Procedures:  NONE  Follow-Up:  Your physician recommends that you schedule a follow-up appointment in: 4 months. You will receive a reminder letter in the mail in about 2 months reminding you to call and schedule your appointment. If you don't receive this letter, please contact our office.  Any Other Special Instructions Will Be Listed Below (If Applicable).  If you need a refill on your cardiac medications before your next appointment, please call your pharmacy.

## 2017-10-01 ENCOUNTER — Other Ambulatory Visit: Payer: Self-pay | Admitting: Physician Assistant

## 2017-10-05 IMAGING — CR DG CHEST 2V
2 series · 2 of 2 positions shown · non-contrast
Comparison: 12/08/2016

CLINICAL DATA: Coronary artery disease. Hypertension. Aortic
stenosis. Pre-op respiratory exam

EXAM:
CHEST  2 VIEW

[w chest pa]
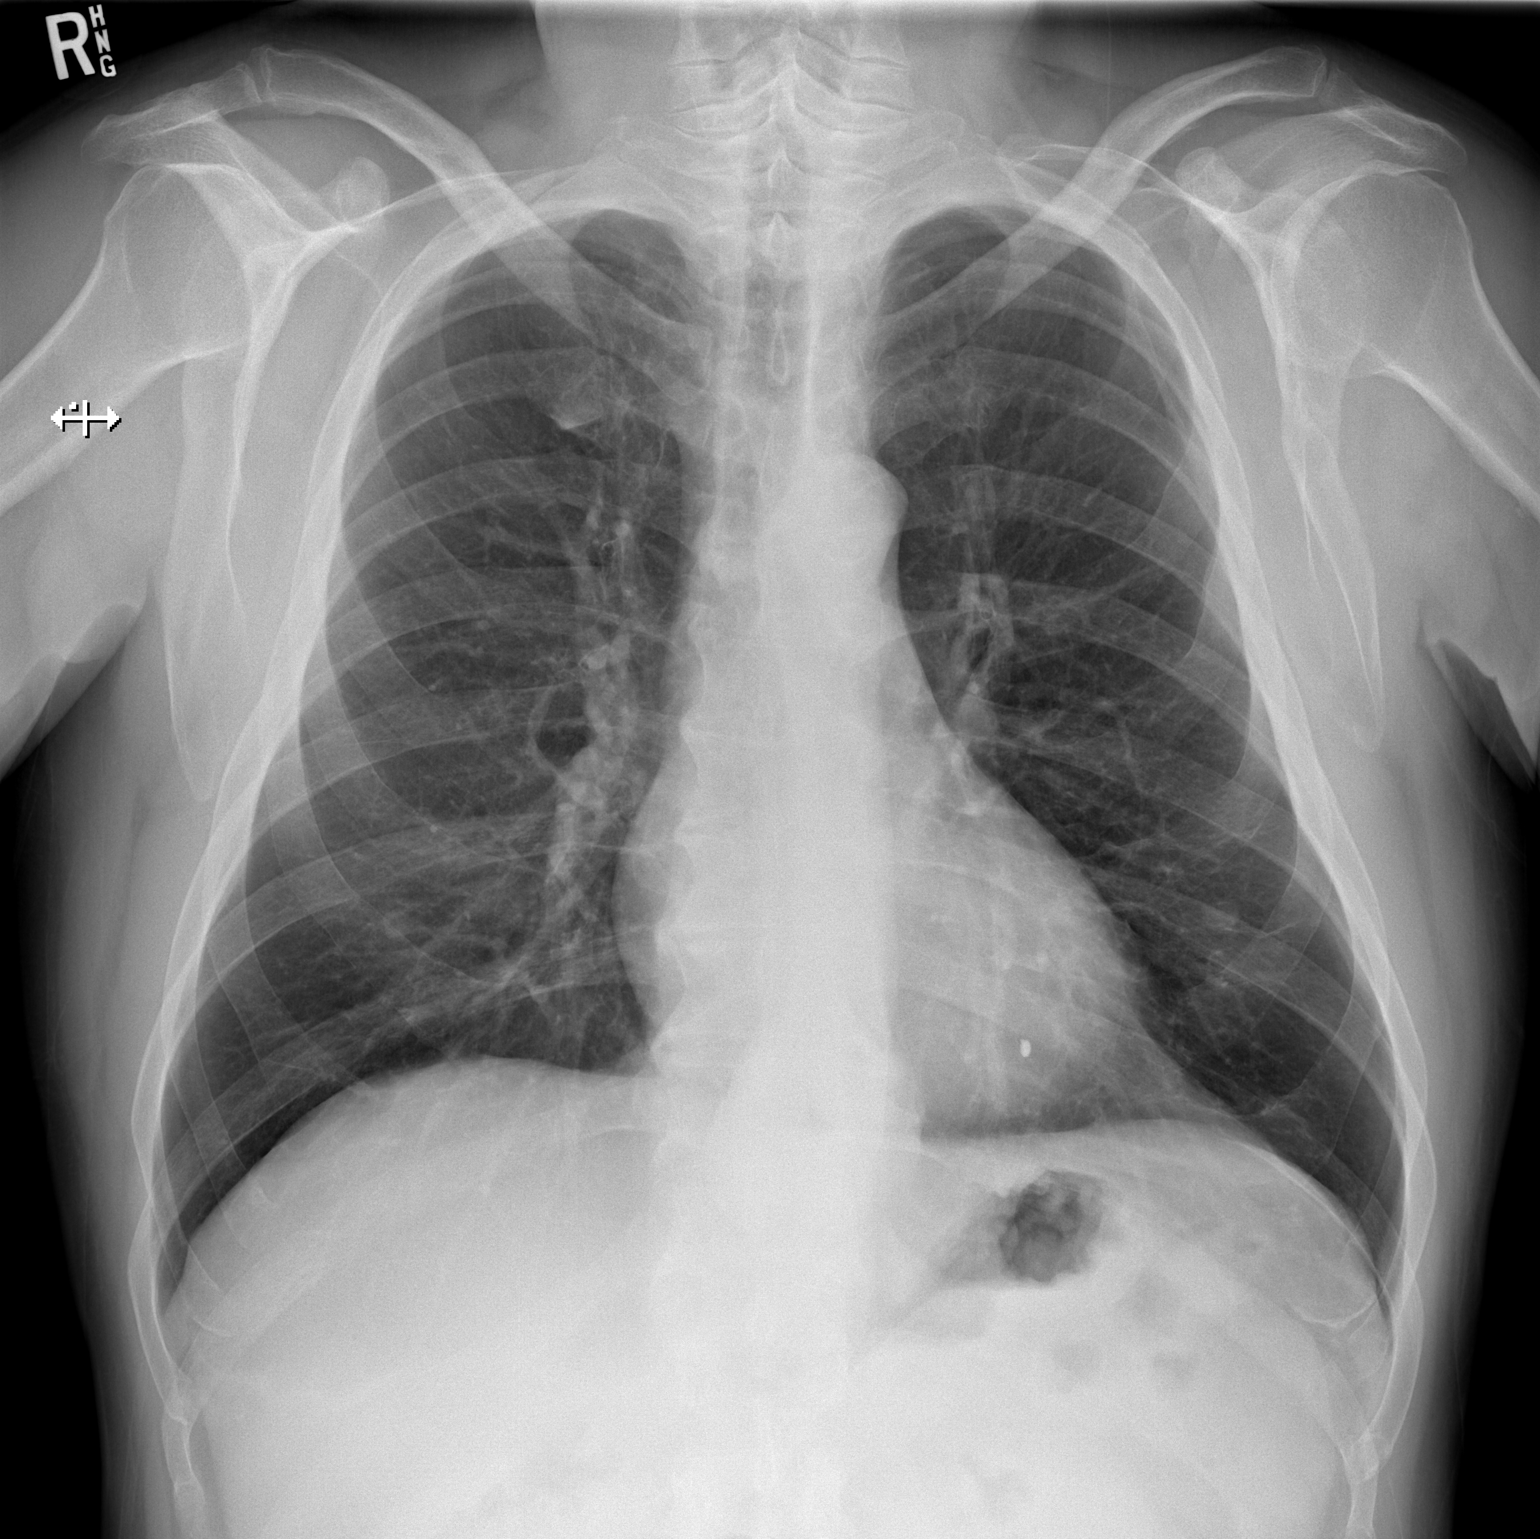

[w chest lat]
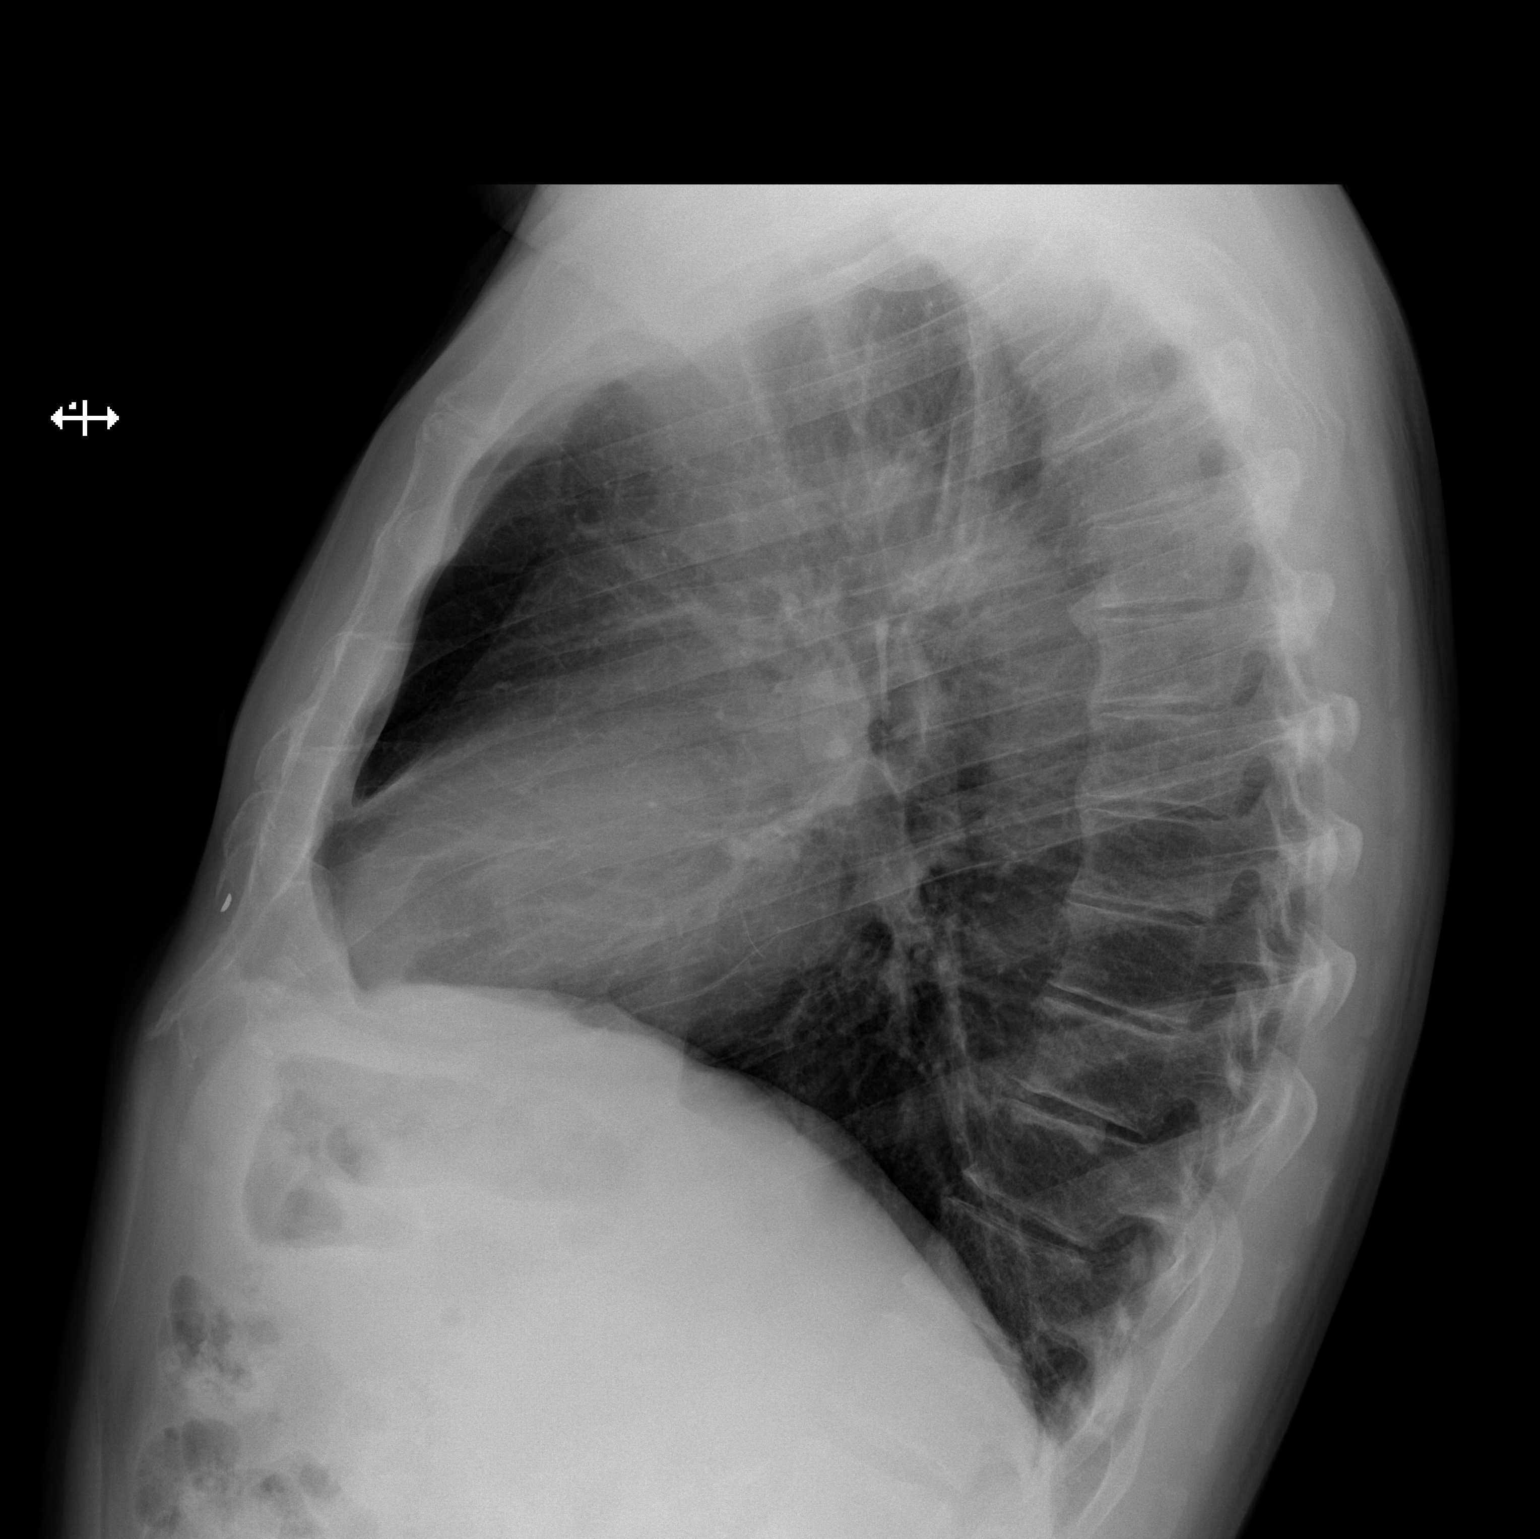

[2 of 2 positions shown; findings below may reference images not displayed]

FINDINGS: The heart size and mediastinal contours are within normal limits. No
evidence of pulmonary infiltrate or edema. No evidence of pleural
effusion. A small nodular density is seen overlying the left lower
lung which was not seen on previous study. This may represent a
nipple shadow or pulmonary nodule.
IMPRESSION: Small nodular density overlying left lower lung, which may represent
a nipple shadow or pulmonary nodule. Chest radiograph with nipple
markers is recommended for further evaluation.

These results will be called to the ordering clinician or
representative by the Radiologist Assistant, and communication
documented in the PACS or zVision Dashboard.

## 2017-10-06 IMAGING — DX DG CHEST 1V PORT
1 series · 1 of 1 positions shown · non-contrast
Comparison: Radiographs December 25, 2016.

CLINICAL DATA: Status post coronary artery bypass graft x3.

EXAM:
PORTABLE CHEST 1 VIEW

[chest ap]
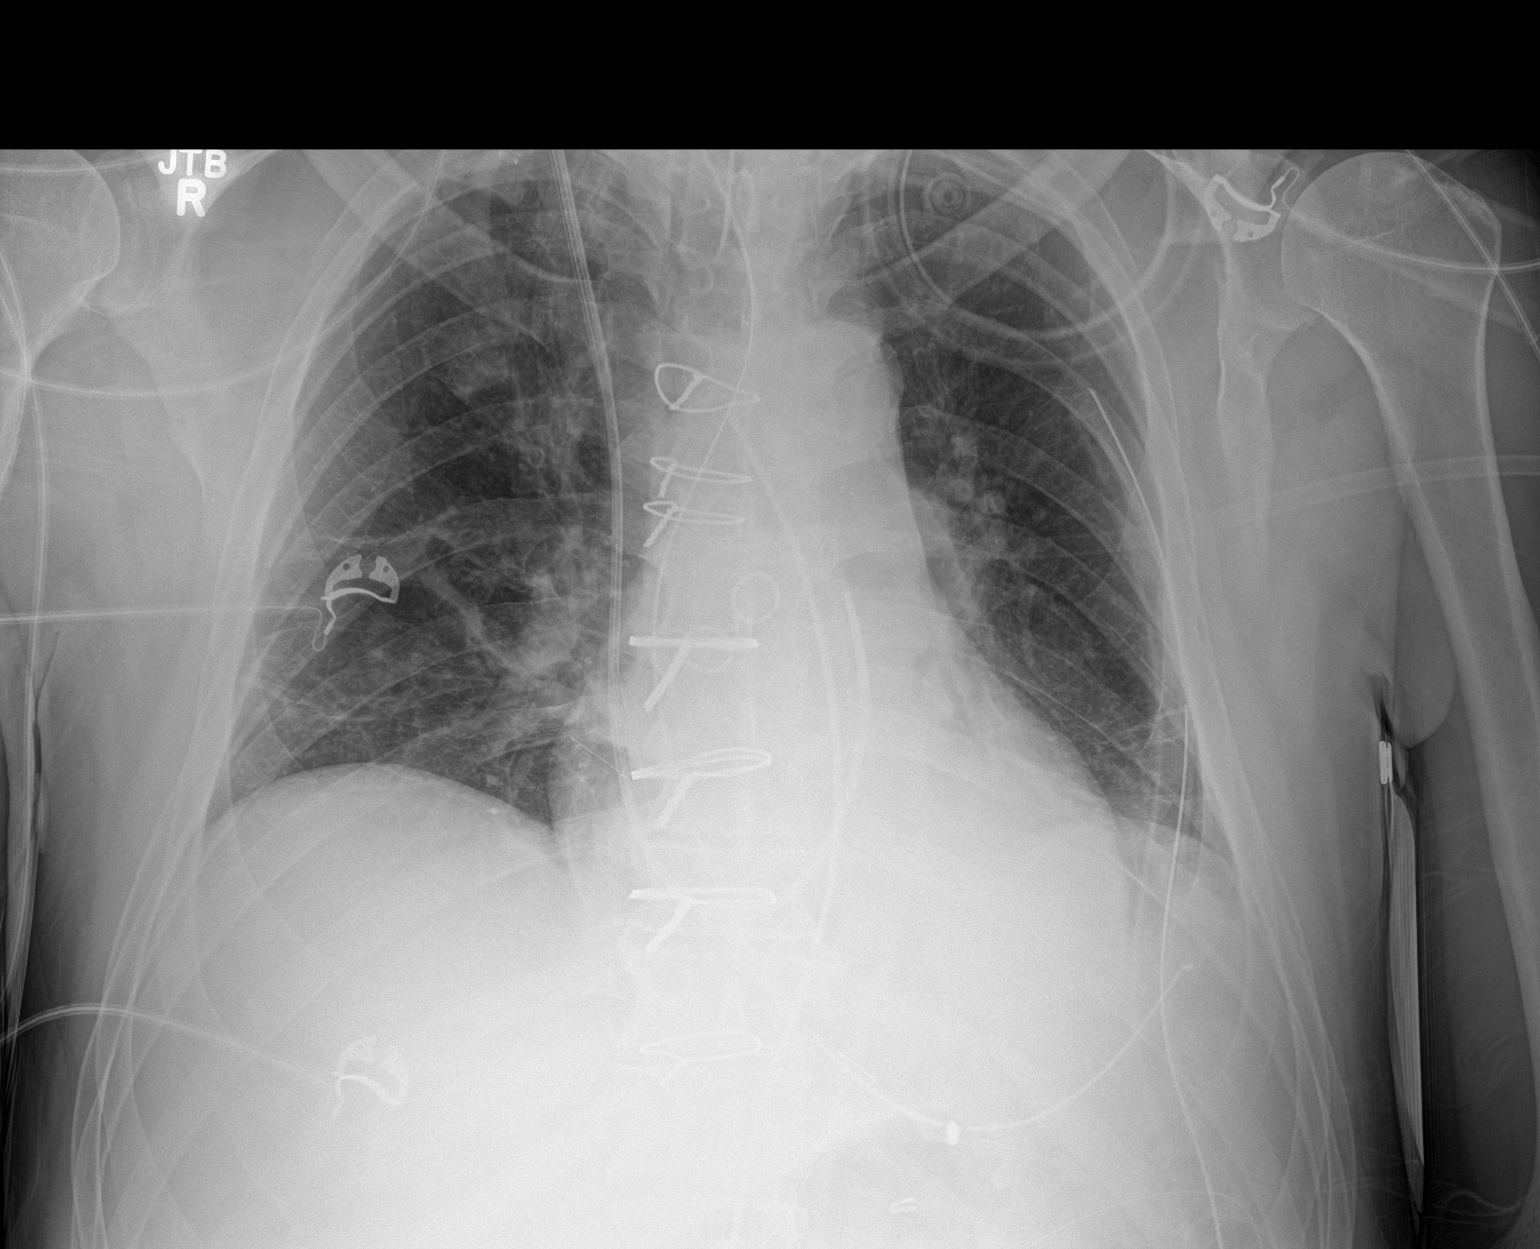

[1 of 1 positions shown; findings below may reference images not displayed]

FINDINGS: Sternotomy wires are noted. Endotracheal tube is seen projected over
tracheal air shadow, with distal tip 5 cm above the carina. Distal
tip of nasogastric tube is seen in expected position of distal
stomach. Right internal jugular Swan-Ganz catheter is noted with
distal tip in expected position of main pulmonary artery. Left-sided
chest tube is noted without definite evidence of pneumothorax. Mild
bibasilar subsegmental atelectasis is noted. No significant pleural
effusion is noted. Bony thorax is unremarkable.
IMPRESSION: Endotracheal and nasogastric tubes are in grossly good position.
Left-sided chest tube is noted without definite evidence of
pneumothorax. Mild bibasilar subsegmental atelectasis.

## 2017-10-09 IMAGING — DX DG CHEST 2V
2 series · 2 of 2 positions shown · non-contrast
Comparison: December 28, 2016

CLINICAL DATA: Chest pain.  Recent coronary artery bypass grafting

EXAM:
CHEST  2 VIEW

[chest pa]
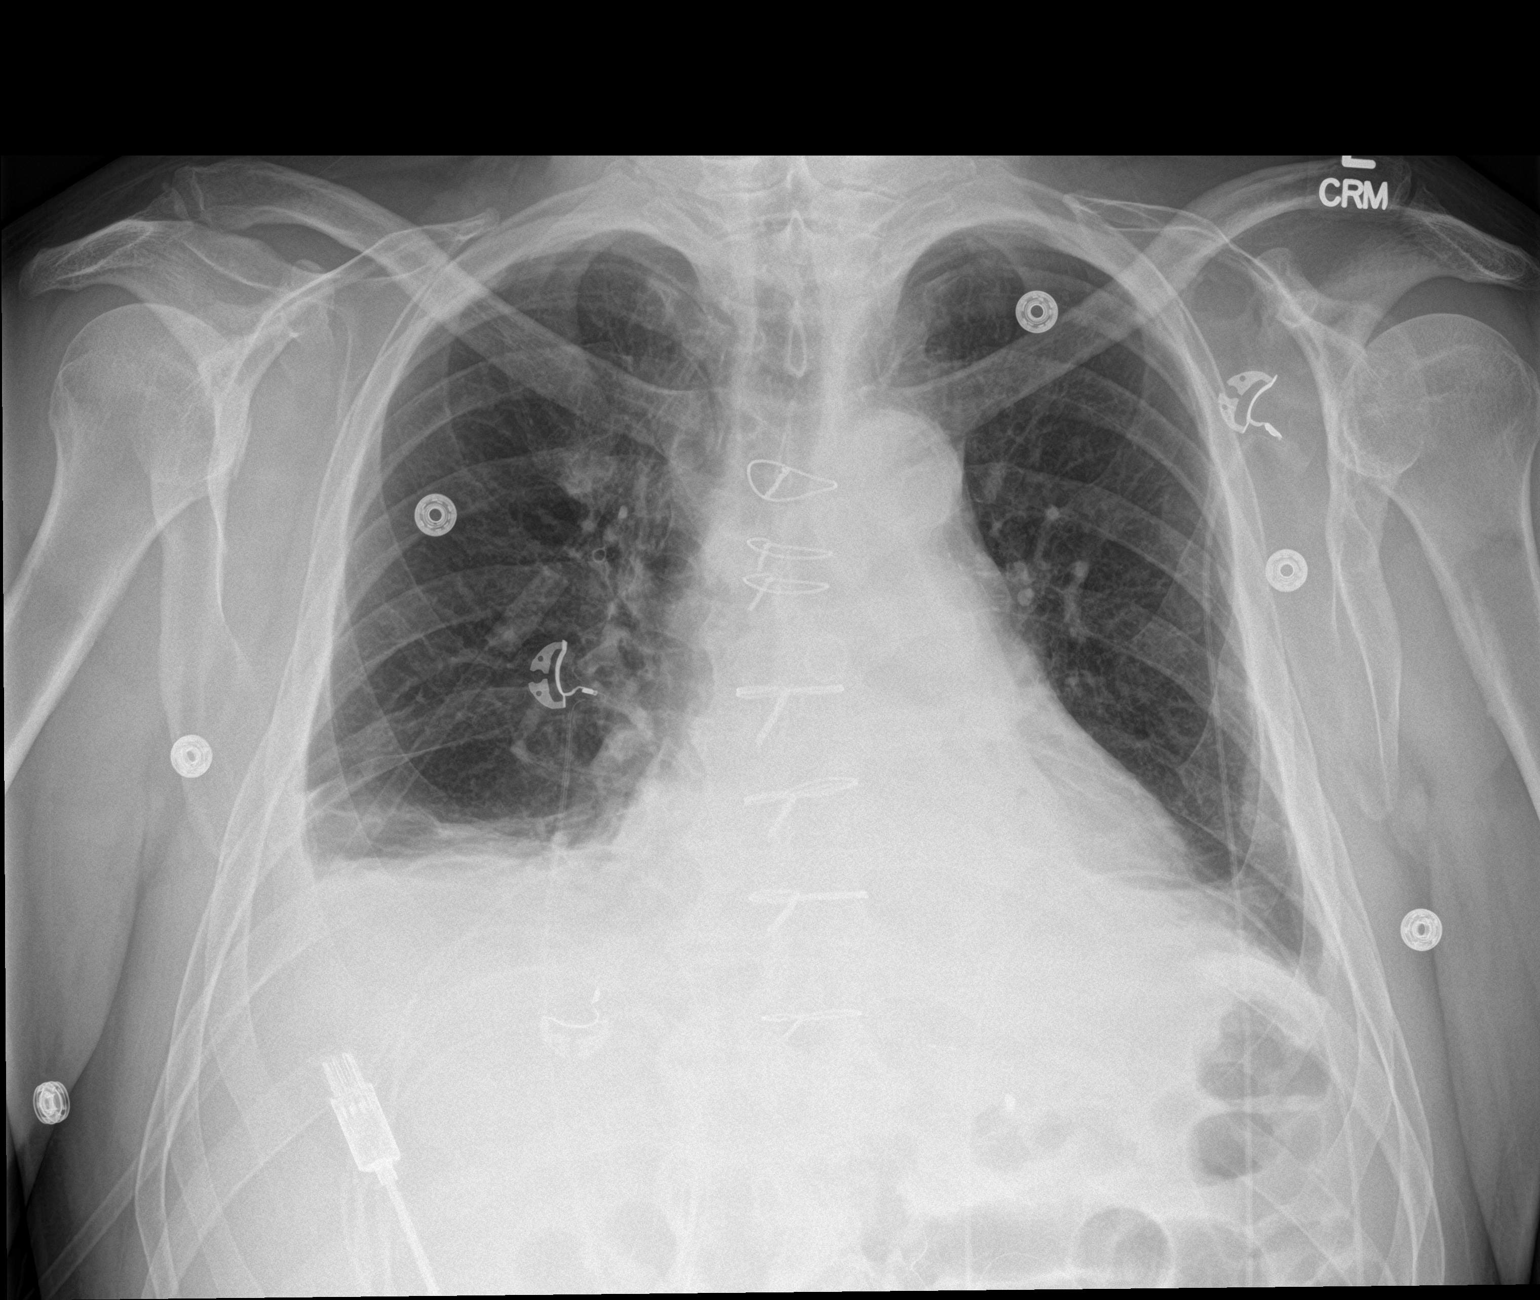

[chest lat]
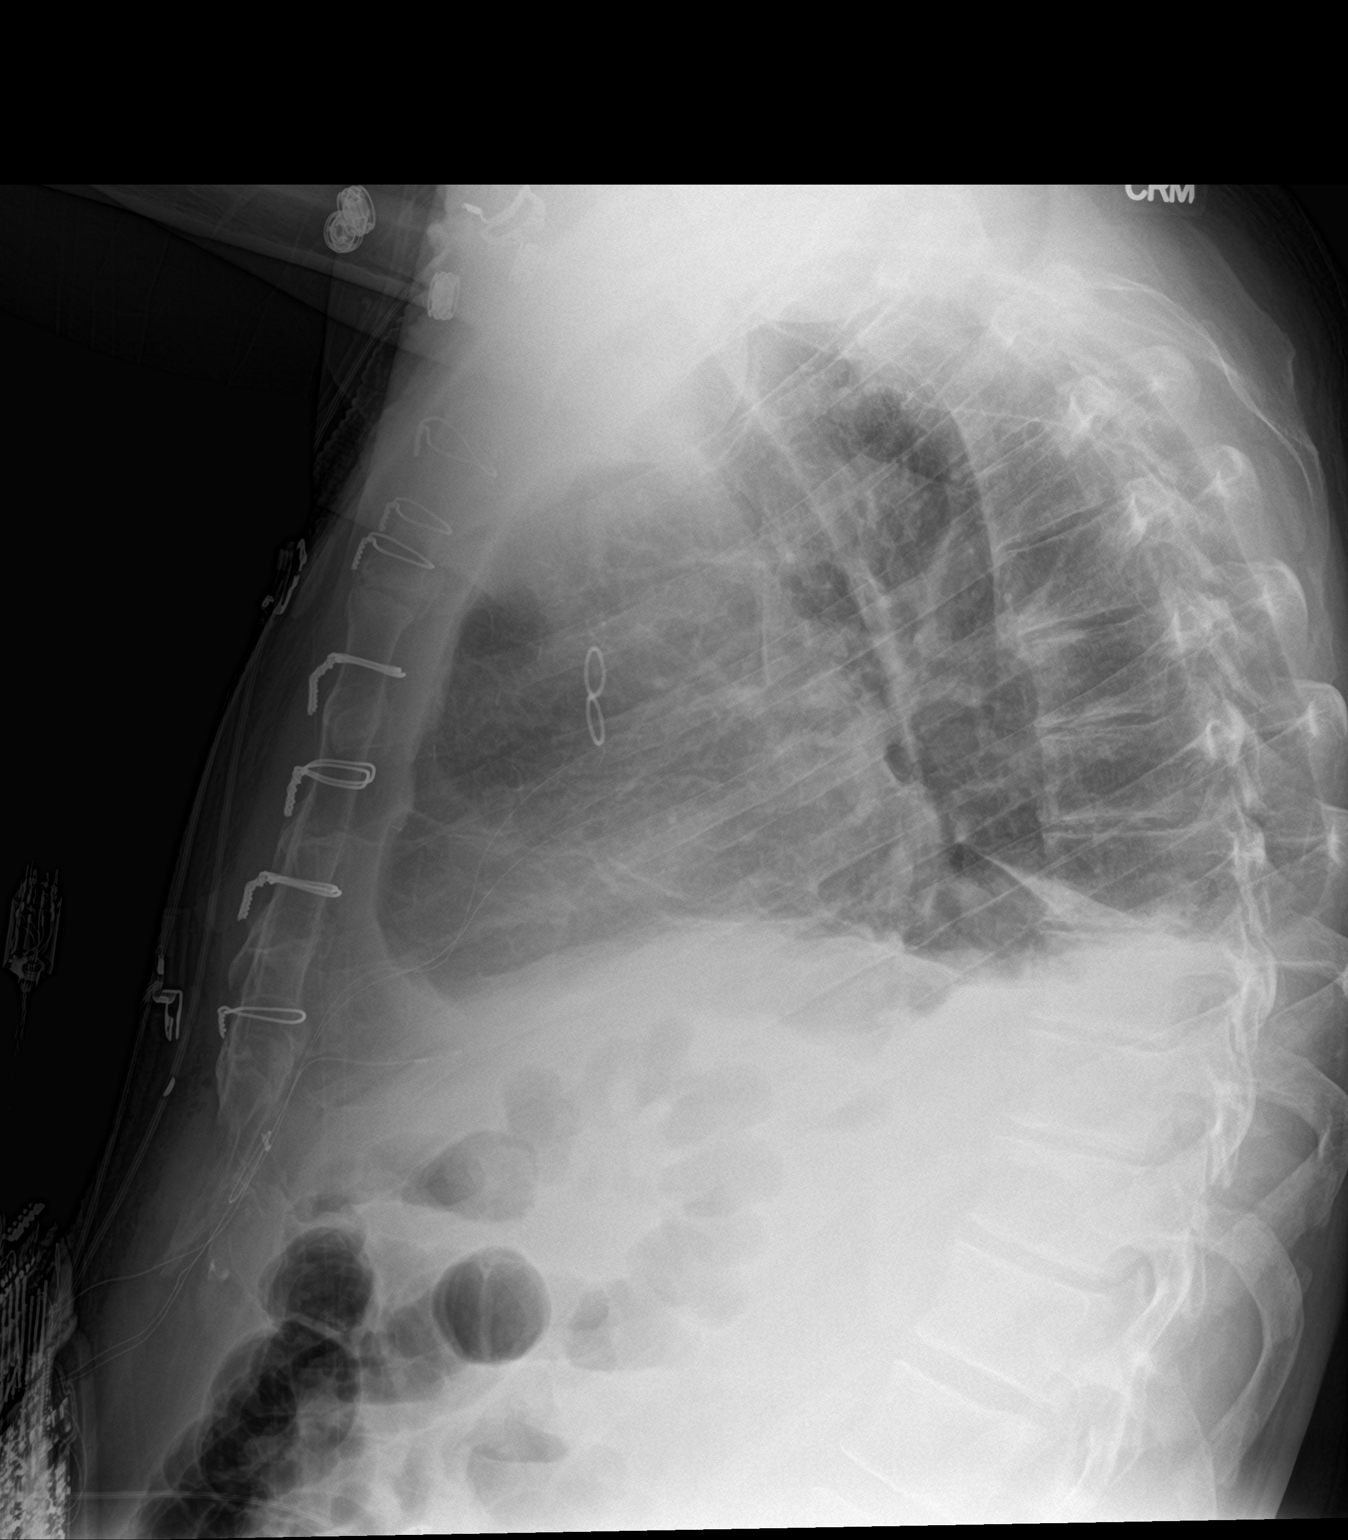

[2 of 2 positions shown; findings below may reference images not displayed]

FINDINGS: Temporary pacemaker wires are attached to the right heart. Cordis
has been removed. No evident pneumothorax. There is a small pleural
effusion on each side with bibasilar atelectasis. Lungs elsewhere
are clear. Heart is upper normal in size with pulmonary vascularity
within normal limits. There is aortic atherosclerosis. No evident
bone lesions.
IMPRESSION: No pneumothorax. Bibasilar atelectasis with small pleural effusions
bilaterally. No new opacity. Stable cardiac prominence. There is
aortic atherosclerosis.

## 2017-10-30 ENCOUNTER — Other Ambulatory Visit: Payer: Self-pay | Admitting: Physician Assistant

## 2017-10-30 DIAGNOSIS — F411 Generalized anxiety disorder: Secondary | ICD-10-CM

## 2017-10-30 NOTE — Telephone Encounter (Signed)
Ok to refill??  Last office visit 08/06/2017.  Last refill 04/22/2017, #2 refills.

## 2017-10-31 ENCOUNTER — Other Ambulatory Visit: Payer: Self-pay | Admitting: Physician Assistant

## 2017-11-02 NOTE — Telephone Encounter (Signed)
Refill appropriate 

## 2017-11-04 ENCOUNTER — Ambulatory Visit: Payer: 59 | Admitting: Physician Assistant

## 2017-11-04 ENCOUNTER — Encounter: Payer: Self-pay | Admitting: Physician Assistant

## 2017-11-04 VITALS — BP 142/86 | HR 64 | Temp 98.6°F | Resp 14 | Wt 181.8 lb

## 2017-11-04 DIAGNOSIS — R739 Hyperglycemia, unspecified: Secondary | ICD-10-CM | POA: Diagnosis not present

## 2017-11-04 DIAGNOSIS — I10 Essential (primary) hypertension: Secondary | ICD-10-CM | POA: Diagnosis not present

## 2017-11-04 DIAGNOSIS — H7291 Unspecified perforation of tympanic membrane, right ear: Secondary | ICD-10-CM | POA: Diagnosis not present

## 2017-11-04 DIAGNOSIS — E785 Hyperlipidemia, unspecified: Secondary | ICD-10-CM | POA: Diagnosis not present

## 2017-11-04 DIAGNOSIS — Z951 Presence of aortocoronary bypass graft: Secondary | ICD-10-CM

## 2017-11-04 DIAGNOSIS — F419 Anxiety disorder, unspecified: Secondary | ICD-10-CM | POA: Diagnosis not present

## 2017-11-04 DIAGNOSIS — D582 Other hemoglobinopathies: Secondary | ICD-10-CM | POA: Diagnosis not present

## 2017-11-04 MED ORDER — AMOXICILLIN-POT CLAVULANATE 875-125 MG PO TABS
1.0000 | ORAL_TABLET | Freq: Two times a day (BID) | ORAL | 0 refills | Status: AC
Start: 2017-11-04 — End: 2017-11-18

## 2017-11-04 MED ORDER — CHLORTHALIDONE 25 MG PO TABS: 25.0000 mg | ORAL_TABLET | Freq: Every day | ORAL | 3 refills | Status: DC

## 2017-11-04 MED ORDER — OFLOXACIN 0.3 % OT SOLN: 5.0000 [drp] | Freq: Every day | OTIC | 0 refills | Status: AC

## 2017-11-04 NOTE — Progress Notes (Signed)
Patient ID: Samuel Cook MRN: [id number], DOB: 06-19-1956, 62 y.o. Date of Encounter: _0 @  Chief Complaint:  No chief complaint on file.   HPI: 62 y.o. year old male     10/20/2016: presents as a New Patient to [facility].   He was seeing [name] in [place]. Last routine office visit there was in November. Says that he would see him routinely to follow-up his high blood pressure and high cholesterol. Says he has "white coat hypertension" and that "it took him 2 to 3 years to get comfortable enough with [name] to where he did not have high blood pressure there." However, he also states that he has no blood pressure machine at home therefore was not checking his blood pressure anywhere else.  Says that he would go there for routine office visit every 6 months and would have complete physical exam every 12 months. "Would check prostate etc.".  Works at [employer]. 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 Several of his coworkers see me and have recommended me as his provider.  He brings in his 3 prescription medicine bottles of the prescription medicines he takes on a daily basis. Crestor 20 mg daily  Ativan 0.5 mg daily Lisinopril 10 mg daily  Says that back in 2001 he was laid off from [employer] for 1-1/2 years. At that time provider felt that he was having some anxiety and depression. Says that at that time he was taking the lorazepam 4 times a day but now just takes it one every morning.  I saw some documentation in epic that indicated he had seen cardiology remotely. He says that remotely he went to the ER with chest pain and then follow-up they had him see cardiology but that was in negative evaluation.  Has no specific concerns to address today. States he has no other chronic medical problems that he knows of.  Noted that BP very high today. He states he is having no angina symptoms, even with exertion. No stroke/TIA symptoms---no weakness in  any extremity, no slurred speech etc  Malignant hypertension / Essential hypertension Repeated BP myself---Left: 240/100---Right: 210/90 Gave Clonidine 0.18m here in the office to bring BP down.  Will increase dose of lisinopril from 10 to 253m Will add Norvasc 37m18m Check CMET today F/U OV 2 weeks to recheck BP and BMET - lisinopril (PRINIVIL,ZESTRIL) 20 MG tablet; Take 1 tablet (20 mg total) by mouth daily.  Dispense: 30 tablet; Refill: 0 - amLODipine (NORVASC) 5 MG tablet; Take 1 tablet (5 mg total) by mouth daily.  Dispense: 30 tablet; Refill: 0  Hyperlipidemia, unspecified hyperlipidemia type He is not fasting. Will check LFTs to monitor. Wait to check FLP at future OV - COMPLETE METABOLIC PANEL WITH GFR Murmur I asked if he had been told he had murmur in past, if he had had echo---says no. Obtain echo.  - ECHOCARDIOGRAM COMPLETE; Future    11/03/2016: Today he reports that he is taking the blood pressure medications as directed. He is having no adverse effects. Reports that he went and bought a blood pressure machine so that he can check blood pressure at home. He has written down some readings and has brought those in. First 3 readings documented are from 3/16. Then he has one reading for each day for 3/17, 3/18, and 3/19. 167/89, 171/81, 169/85, 137/84, 159/90, 169/91  He did go for echocardiogram that was ordered at last visit. This was performed on  10/29/2016. The significant finding on this--- is of the aortic valve:  moderately to severely calcified annulus. Moderately thickened, moderately calcified leaflets. Moderate stenosis.  Has no complaints or concerns today. Just keeps talking about the fact that in the past his blood pressure has always been high at doctor's visits and that it took him several years to get comfortable enough with his prior provider to where his blood pressure was not high there. Continues to say that he feels perfectly fine with no  symptoms.  Malignant hypertension/ Essential hypertension Continue lisinopril  27m. Check BMET to f/u after increasing dose at LOV Will increase Norvasc to 174mQD Will add Bystolic 72m36mD F/U OV 2 weeks.  Cont to check blood pressure at home and document readings and bring those to the next visit.  Hyperlipidemia, unspecified hyperlipidemia type He is not fasting. Will check LFTs to monitor. Wait to check FLP at future OV - COMPLETE METABOLIC PANEL WITH GFR  Murmur S/P Echo 10/29/2016-- showing aortic valve: Moderately to severely calcified annulus. Moderately thickened, moderately calcified leaflets. Moderate stenosis.  Aortic valve stenosis, etiology of cardiac valve disease unspecified S/P Echo 10/29/2016-- showing aortic valve: Moderately to severely calcified annulus. Moderately thickened, moderately calcified leaflets. Moderate stenosis. Will refer him to cardiology for follow-up - Ambulatory referral to Cardiology     11/17/2016: Today he brings in blood pressure log --has one reading per day. Readings are as follows: 194/90, 163/94, 172/96, 140/87, 162/95, 171/94, 129/83, 139/83, 134/82, 153/93 Today he reports that he has recently been noticing some discomfort in his left shoulder region. Sleeps on his left side--doesn't know if that is affecting this. He states that he has appointment to see Cardiology April 5. No other concerns to address today.     02/04/2017: HPI: [age]. y/o male here for CPE.   Today I have reviewed his hospital discharge summary. Hospitalized 12/26/16 - 12/31/16. He had CABG 3 by Dr. GerServando Snare/11/18. After reviewing the hospital discharge summary--this indicates that he had no postoperative complications at all. Discharge summary does state to stop amlodipine and fish oil. Was discharged home with aspirin, lisinopril 20 mg daily, lorazepam 0.5 as needed, Bystolic 10 mg daily, omeprazole 20 mg daily when necessary, Crestor 20 mg daily. Also  oxycodone and Tylenol for pain.  Prior to his surgery he had a positive stress test and cardiac catheterization showed evidence of left main obstruction. Also noted to have abnormal aortic valve. Was subsequently scheduled for CABG.  Today patient states that since the surgery--- says that at first when he got home he was having difficulty sleeping at night but then during the day all of a sudden would fall asleep and the next thing he would know he be waking up. However says that all of this has improved and pretty much resolved. Says that his blood pressure medicines have been adjusted and that his lisinopril was increased from once a day to twice a day recently and he had some issues with the pharmacy etc. but has now gotten that straightened out and did confirm that he is now taking the appropriate dose. He also states that his anxiety has been increased ever since he's been home from the hospital. Also says that he has been out of his lorazepam and has had none available since the hospitalization. Is feeling much more anxious than usual. Says his next appointment with cardiology is July 9. States that he is scheduled to see Dr. GerServando Snaremorrow and they're supposed to  discuss therapy and discuss driving again. Says that so far since his surgery he has not been able to drive.  No other concerns to address today. States that he is scheduled to do preventive care/complete physical exam today.   Addendum added---02/16/2017: I was just cc-ed note from PA with [facility]. Their note included that patient had A1c of 6.3 on 12/25/16. I will make a note of this to follow-up at future visits/lab. Noted that glucose was normal on his recent lab with me and suspect that he has improved his diet since CABG.  However I will discuss low carbohydrate diet at future visit and monitor A1c.   AT OV 02/04/2017: Updated Preventive Care.    05/07/2017: Today he reports that he stopped the amlodipine because  this was causing severe headache. Says that his prior PCP had prescribed him that medication in the past and it caused headache and he had to stop it back then. States that more recently he was having severe headache and felt horrible and felt quite certain that it was from that medicine so he has stopped it. Says that once he stopped that medication, the headache resolved. Has had no recurrence of headache. Stopped the amlodipine 2 weeks ago.  Today I did give and review information regarding low carbohydrate diet.  He says that his "wife keeps a bowl of candy there for the grandkids" and that he does eat some of that.  Also does eat quite a bit of bread and other foods with lots of carbs.  Says that he will work on decreasing these foods and making these changes. Today I also explained about hemoglobin A1c and explained that we'll recheck that today and will monitor this in the future as well.  He has no other specific concerns to address today. Reviewed that he had office visit with [facility] on 03/19/17 and office visit with Cardiology 02/23/17.  He is taking his Crestor as directed. No myalgias or other adverse effects. He is taking his Bystolic and lisinopril and other medications as directed and those are causing no adverse effects.    08/06/2017: Today he reports that he is feeling the best he has felt in years.  Says that he feels great. He has been doing very good job with his diet.   Says that he has stopped eating candy and has stopped sodas.  Says that he is eating lots of apples.  Says that using his scales at home he has lost from 200 pounds down to 175 pounds. He is taking his Crestor as directed. No myalgias or other adverse effects. He is taking his Bystolic and lisinopril and other medications as directed and those are causing no adverse effects. He has no specific concerns to address today.  Reports that there are 2 medications that he has to pay over $40 --for each of those  medicines--- Bystolic and the Crestor.  Told him that at each visit I will give him samples of Bystolic to try to offset the cost.    11/04/2017: Today I reviewed the Result Note from his labs performed at last visit with me 08/06/17.   His A1c had decreased from 6.4 to 5.7----with his diet changes----Was told to -- Keep up the good work! Recommended for him to make sure that he was taking the Crestor every day and that he had not accidentally stopped it or something.   --Reviewed that 01/2017 LDL was 56 and on this set of labs was 102.   --Reviewed  that we had made no change to his medicine 01/2017 so made no sense that LDL would be that much higher. I reviewed that labs showed polycythemia.  Placed order for referral to hematology.  At that lab H/H was 17.7/52.5.  Today I also reviewed office note with Hematology from 08/24/17.   "They reviewed that he had some erythrocytosis that appeared to be new developing between June and December of this year.   Concurrently there has been some macrocytosis development without leukocytosis or thrombocythemia.   Differential includes primary or secondary erythrocytosis.   To start evaluation with repeating lab work including erythropoietin.  Due to fatigue and macrocytosis would also obtain TSH level.   Planned for him to return to their clinic in 1 week to review findings.   If erythropoietin level suppressed would pursue primary erythrocytosis assessment such as myeloproliferative neoplasm with mutational testing and bone marrow biopsy.   If erythropoietin was upper normal or elevated would pursue secondary erythrocytosis evaluation including pulmonary function testing, imaging of the chest, abdomen, pelvis looking for emphysema, hepatic, or renal malignancy."  I have reviewed chart and do not see that he went for follow-up office visit with hematology.  Today I also reviewed his Cardiology note from 09/11/17.  It was a routine six-month follow-up  visit. At his visit there that day blood pressure initially was 178/90 and then improved to 164/84 on recheck.   At that visit they increased Bystolic to 20 mg daily.  Note states "consider further titration of chlorthalidone of additional BP control as needed ". Hyperlipidemia is followed by PCP.  Goal LDL less than 70 with known CAD. Regarding his aortic stenosis they would follow this with routine echocardiograms.  TODAY--11/04/2017:  He reports that he is quite certain that he is taking the Crestor daily.  Does not think that accidentally ran out of his medicines etc.  Does not think he has been skipping doses.  He is fasting today so we will recheck lab to follow this issue. Today he reports that he did increase the Bystolic as directed by cardiology. They I discussed the hematology information above.  He says that he was supposed to have gone there for a follow-up appointment last Tuesday and " it totally slipped his mind ".  I have told him to go home and schedule follow-up visit with hematology. Today he reports that he is having problems with his right ear.  Says that on Saturday night he was in bed and "felt like an ice pick jabbing his right ear." Has seen some blood from the right ear since then. He has no other specific concerns to address today. Otherwise he has been feeling good.  Having no angina symptoms.     Past Medical History:  Diagnosis Date  . Anxiety   . Aortic stenosis   . Coronary artery disease    a. s/p CABG in 12/2016 with LIMA-LAD, reverse SVG-OM, and reverse SVG-PDA  . GERD (gastroesophageal reflux disease)    occasionally  . Hyperlipidemia   . Hypertension      Home Meds: Outpatient Medications Prior to Visit  Medication Sig Dispense Refill  . acetaminophen (TYLENOL) 500 MG tablet Take 1,000 mg by mouth 2 (two) times daily.    Marland Kitchen aspirin EC 325 MG EC tablet Take 1 tablet (325 mg total) by mouth daily. 30 tablet 0  . chlorthalidone (HYGROTON) 25 MG  tablet Take 0.5 tablets (12.5 mg total) by mouth daily. 45 tablet 3  . lisinopril (  PRINIVIL,ZESTRIL) 20 MG tablet Take 1 tablet (20 mg total) by mouth 2 (two) times daily. 180 tablet 3  . LORazepam (ATIVAN) 0.5 MG tablet TAKE 1 TABLET BY MOUTH TWICE DAILY AS NEEDED FOR ANXIETY. 60 tablet 0  . Nebivolol HCl (BYSTOLIC) 20 MG TABS Take 1 tablet (20 mg total) by mouth daily. 90 tablet 3  . rosuvastatin (CRESTOR) 20 MG tablet TAKE (1) TABLET BY MOUTH ONCE DAILY. 30 tablet 0   No facility-administered medications prior to visit.       Allergies:  Allergies  Allergen Reactions  . No Known Allergies   . Norvasc [Amlodipine Besylate] Other (See Comments)    Headache    Social History   Socioeconomic History  . Marital status: Married    Spouse name: Not on file  . Number of children: Not on file  . Years of education: Not on file  . Highest education level: Not on file  Social Needs  . Financial resource strain: Not on file  . Food insecurity - worry: Not on file  . Food insecurity - inability: Not on file  . Transportation needs - medical: Not on file  . Transportation needs - non-medical: Not on file  Occupational History  . Not on file  Tobacco Use  . Smoking status: Former Smoker    Packs/day: 1.00    Years: 20.00    Pack years: 20.00    Types: Cigarettes    Last attempt to quit: 10/20/1993    Years since quitting: 24.0  . Smokeless tobacco: Current User    Types: Snuff  Substance and Sexual Activity  . Alcohol use: Yes    Comment: rare   . Drug use: Yes    Types: Marijuana, Codeine    Comment: occasionally....takes 1 puff when really stressed  . Sexual activity: Yes  Other Topics Concern  . Not on file  Social History Narrative  . Not on file    Family History  Problem Relation Age of Onset  . Cancer Mother   . Early death Mother   . Stroke Father   . Birth defects Maternal Aunt   . Hyperlipidemia Maternal Aunt   . Hearing loss Maternal Uncle   . Heart  disease Maternal Grandmother      Review of Systems:  See HPI for pertinent ROS. All other ROS negative.    Physical Exam: Blood pressure (!) 142/86, pulse 64, temperature 98.6 F (37 C), temperature source Oral, resp. rate 14, weight 82.5 kg (181 lb 12.8 oz), SpO2 98 %., There is no height or weight on file to calculate BMI. General: WNWD WM. Appears in no acute distress. HEENT: Left Ear: Normal Right: There is bright red blood at upper portion of area where TM should be. There is dark brown obstruction of reminder of area where TM should be. Neck: Supple. No thyromegaly. No lymphadenopathy. No carotid bruit Lungs: Clear bilaterally to auscultation without wheezes, rales, or rhonchi. Breathing is unlabored. Heart: RRR with S1 S2. III/VI murmur at apex Musculoskeletal:  Strength and tone normal for age. Extremities/Skin: Warm and dry.  No edema. Neuro: Alert and oriented X 3. Moves all extremities spontaneously. Gait is normal. CNII-XII grossly in tact. Psych:  Responds to questions appropriately with a normal affect.     ASSESSMENT AND PLAN:  62 y.o. year old male with   Reports that there are 2 medications that he has to pay over $40 --for each of those medicines--- Bystolic and the Crestor.  Told  him that at each visit I will give him samples of Bystolic to try to offset the cost.  Perforation of right tympanic membrane 11/04/2017: He is to start taking the Augmentin and putting in the Floxin otic drops.  I have also put in referral for him to follow-up with ENT and of marked this as urgent. - Ambulatory referral to ENT - amoxicillin-clavulanate (AUGMENTIN) 875-125 MG tablet; Take 1 tablet by mouth 2 (two) times daily for 14 days.  Dispense: 28 tablet; Refill: 0 - ofloxacin (FLOXIN) 0.3 % OTIC solution; Place 5 drops into the right ear daily for 7 days.  Dispense: 10 mL; Refill: 0   Essential hypertension 11/04/2017:  Cardiology recently increased dose of bysystolic secondary to  uncontrolled hypertension.  Their note stated that if blood pressure continued to be uncontrolled they recommended to increase chlorthalidone.  At this time I will increase chlorthalidone.  He had been taking a half of the 25 mg tablet daily.  = 12.5 mg daily.  Will increase to a whole tablet for 25 mg daily. Continue other current BP meds in addition. - COMPLETE METABOLIC PANEL WITH GFR - chlorthalidone (HYGROTON) 25 MG tablet; Take 1 tablet (25 mg total) by mouth daily.  Dispense: 90 tablet; Refill: 3  Hyperlipidemia, unspecified hyperlipidemia type 11/04/2017: Result note 08/06/17 states "have him make sure that he is taking the Crestor every day and that he has not accidentally stopped it or something. --/2018 LDL was 56.  Now it is 102.  We may no change to his meds 01/2017 so this does not make sense." Today patient states that he is pretty certain that he is taking his Crestor that this is included in his daily medicines and he does not think that he has accidentally been out of this or been skipping this.  Ahead and recheck lab today to monitor this. - COMPLETE METABOLIC PANEL WITH GFR - Lipid panel    Hyperglycemia 05/07/2017: Today I did give and review information regarding low carbohydrate diet. He says that his "wife keeps a bowl of candy there for the grandkids" and that he does eat some of that. Also does eat quite a bit of bread and other foods with lots of carbs. Says that he will work on decreasing these foods and making these changes. Today I also explained about hemoglobin A1c and explained that we'll recheck that today and will monitor this in the future as well. - Hemoglobin A1c 08/06/2017: He reports that he has made significant diet changes.  Has stopped all candy and soda.  Is eating a lot of apples.  States that he has lost from 200 pounds to 175 pounds on his scale at home.   (Our scales are reading 181 today but he says at home they are 175. ) Recheck A1c to  monitor. 11/04/2017:  At lab 08/06/17 A1c had decreased from 6.4 to 5.7--- with diet changes----keep up the good work! - COMPLETE METABOLIC PANEL WITH GFR - Hemoglobin A1c  Elevated hemoglobin ([facility]) 11/04/2017: See HPI from today's visit for details regarding this.  Today I have told him to go home and call and schedule follow-up visit with hematology to complete this evaluation.  Aortic valve stenosis, etiology of cardiac valve disease unspecified S/P Echo 10/29/2016-- showing aortic valve: Moderately to severely calcified annulus. Moderately thickened, moderately calcified leaflets. Moderate stenosis. 11/04/2017:  This is now managed by cardiology.  They will follow/monitor with routine echocardiograms.  Murmur S/P Echo 10/29/2016-- showing aortic valve: Moderately to  severely calcified annulus. Moderately thickened, moderately calcified leaflets. Moderate stenosis. 11/04/2017:  This is now managed by Cardiology.  They will follow/monitor with routine echocardiograms.   S/P CABG x 3 11/04/2017:  This is now managed by Cardiology.    Preventive Care:  05/07/2017---He had CPE with me 02/04/2017---see that note regarding Preventive Care     Plan routine follow-up visit in 3 months.  Follow-up sooner if needed.      Marin Olp [place], [place], [facility] 11/04/2017 7:57 AM

## 2017-11-05 ENCOUNTER — Ambulatory Visit (INDEPENDENT_AMBULATORY_CARE_PROVIDER_SITE_OTHER): Payer: 59 | Admitting: Otolaryngology

## 2017-11-05 DIAGNOSIS — H9313 Tinnitus, bilateral: Secondary | ICD-10-CM | POA: Diagnosis not present

## 2017-11-05 DIAGNOSIS — H903 Sensorineural hearing loss, bilateral: Secondary | ICD-10-CM

## 2017-11-05 DIAGNOSIS — H6983 Other specified disorders of Eustachian tube, bilateral: Secondary | ICD-10-CM | POA: Diagnosis not present

## 2017-11-05 LAB — COMPLETE METABOLIC PANEL WITH GFR
AG Ratio: 1.4 (calc) (ref 1.0–2.5)
ALKALINE PHOSPHATASE (APISO): 56 U/L (ref 40–115)
ALT: 20 U/L (ref 9–46)
AST: 23 U/L (ref 10–35)
Albumin: 4.5 g/dL (ref 3.6–5.1)
BUN: 23 mg/dL (ref 7–25)
CHLORIDE: 98 mmol/L (ref 98–110)
CO2: 32 mmol/L (ref 20–32)
CREATININE: 1.16 mg/dL (ref 0.70–1.25)
Calcium: 10.2 mg/dL (ref 8.6–10.3)
GFR, Est African American: 78 mL/min/{1.73_m2} (ref >=60)
GFR, Est Non African American: 68 mL/min/{1.73_m2} (ref >=60)
GLOBULIN: 3.2 g/dL (ref 1.9–3.7)
GLUCOSE: 110 mg/dL — AB (ref 65–99)
Potassium: 4.7 mmol/L (ref 3.5–5.3)
SODIUM: 139 mmol/L (ref 135–146)
Total Bilirubin: 0.8 mg/dL (ref 0.2–1.2)
Total Protein: 7.7 g/dL (ref 6.1–8.1)

## 2017-11-05 LAB — HEMOGLOBIN A1C
EAG (MMOL/L): 7 (calc)
Hgb A1c MFr Bld: 6 % of total Hgb — ABNORMAL HIGH (ref <5.7)
MEAN PLASMA GLUCOSE: 126 (calc)

## 2017-11-05 LAB — LIPID PANEL
CHOL/HDL RATIO: 3.4 (calc) (ref <5.0)
CHOLESTEROL: 151 mg/dL (ref <200)
HDL: 45 mg/dL (ref >40)
LDL Cholesterol (Calc): 88 mg/dL (calc)
Non-HDL Cholesterol (Calc): 106 mg/dL (calc) (ref <130)
Triglycerides: 87 mg/dL (ref <150)

## 2017-11-26 ENCOUNTER — Other Ambulatory Visit (HOSPITAL_COMMUNITY): Payer: Self-pay | Admitting: *Deleted

## 2017-11-26 DIAGNOSIS — D582 Other hemoglobinopathies: Secondary | ICD-10-CM

## 2017-11-27 ENCOUNTER — Encounter (HOSPITAL_COMMUNITY): Payer: Self-pay | Admitting: Hematology

## 2017-11-27 ENCOUNTER — Inpatient Hospital Stay (HOSPITAL_COMMUNITY): Payer: 59

## 2017-11-27 ENCOUNTER — Inpatient Hospital Stay (HOSPITAL_COMMUNITY): Payer: 59 | Attending: Hematology and Oncology | Admitting: Hematology

## 2017-11-27 VITALS — BP 131/68 | HR 65 | Temp 98.6°F | Resp 16 | Wt 186.4 lb

## 2017-11-27 DIAGNOSIS — D751 Secondary polycythemia: Secondary | ICD-10-CM | POA: Diagnosis not present

## 2017-11-27 DIAGNOSIS — Z79899 Other long term (current) drug therapy: Secondary | ICD-10-CM | POA: Diagnosis not present

## 2017-11-27 DIAGNOSIS — D582 Other hemoglobinopathies: Secondary | ICD-10-CM

## 2017-11-27 DIAGNOSIS — F17228 Nicotine dependence, chewing tobacco, with other nicotine-induced disorders: Secondary | ICD-10-CM | POA: Insufficient documentation

## 2017-11-27 DIAGNOSIS — Z7982 Long term (current) use of aspirin: Secondary | ICD-10-CM

## 2017-11-27 LAB — CBC WITH DIFFERENTIAL/PLATELET
BASOS ABS: 0.1 10*3/uL (ref 0.0–0.1)
Basophils Relative: 1 %
EOS PCT: 2 %
Eosinophils Absolute: 0.2 10*3/uL (ref 0.0–0.7)
HCT: 47.5 % (ref 39.0–52.0)
Hemoglobin: 15.9 g/dL (ref 13.0–17.0)
Lymphocytes Relative: 22 %
Lymphs Abs: 2.3 10*3/uL (ref 0.7–4.0)
MCH: 33.3 pg (ref 26.0–34.0)
MCHC: 33.5 g/dL (ref 30.0–36.0)
MCV: 99.6 fL (ref 78.0–100.0)
MONO ABS: 0.9 10*3/uL (ref 0.1–1.0)
Monocytes Relative: 8 %
Neutro Abs: 7.1 10*3/uL (ref 1.7–7.7)
Neutrophils Relative %: 67 %
PLATELETS: 245 10*3/uL (ref 150–400)
RBC: 4.77 MIL/uL (ref 4.22–5.81)
RDW: 12 % (ref 11.5–15.5)
WBC: 10.5 10*3/uL (ref 4.0–10.5)

## 2017-11-27 LAB — COMPREHENSIVE METABOLIC PANEL
ALT: 34 U/L (ref 17–63)
ANION GAP: 12 (ref 5–15)
AST: 38 U/L (ref 15–41)
Albumin: 4.4 g/dL (ref 3.5–5.0)
Alkaline Phosphatase: 48 U/L (ref 38–126)
BUN: 28 mg/dL — ABNORMAL HIGH (ref 6–20)
CHLORIDE: 100 mmol/L — AB (ref 101–111)
CO2: 25 mmol/L (ref 22–32)
Calcium: 9.4 mg/dL (ref 8.9–10.3)
Creatinine, Ser: 1.16 mg/dL (ref 0.61–1.24)
Glucose, Bld: 113 mg/dL — ABNORMAL HIGH (ref 65–99)
POTASSIUM: 4.3 mmol/L (ref 3.5–5.1)
Sodium: 137 mmol/L (ref 135–145)
Total Bilirubin: 1.2 mg/dL (ref 0.3–1.2)
Total Protein: 7.6 g/dL (ref 6.5–8.1)

## 2017-11-27 NOTE — Progress Notes (Signed)
Patient Care Team: [name], [name], PA-C as PCP - General (Physician Assistant) Lyn RecordsSmith, Henry W, MD as Consulting Physician (Cardiology)  DIAGNOSIS:  Encounter Diagnosis  Name Primary?  . Elevated hemoglobin (HCC) Yes     CHIEF COMPLIANT: Follow-up for elevated hemoglobin.  INTERVAL HISTORY: Samuel Cook is a 62 year old white male who is seen in follow-up for erythrocytosis.  He uses snuff tobacco about 5 cans/week.  Lately he is cut down to 1 can/week.  He denies any daytime excessive sleepiness.  He denies any other symptoms of sleep apnea.  Denies any fevers, night sweats or weight loss.  No recent hospitalizations noted.  REVIEW OF SYSTEMS:   Constitutional: Denies fevers, chills or abnormal weight loss Eyes: Denies blurriness of vision Ears, nose, mouth, throat, and face: Denies mucositis or sore throat Respiratory: Denies cough, dyspnea or wheezes Cardiovascular: Denies palpitation, chest discomfort Gastrointestinal:  Denies nausea, heartburn or change in bowel habits Skin: Denies abnormal skin rashes Lymphatics: Denies new lymphadenopathy or easy bruising Neurological:Denies numbness, tingling or new weaknesses Behavioral/Psych: Mood is stable, no new changes  Extremities: No lower extremity edema All other systems were reviewed with the patient and are negative.  I have reviewed the past medical history, past surgical history, social history and family history with the patient and they are unchanged from previous note.  ALLERGIES:  is allergic to no known allergies and norvasc [amlodipine besylate].  MEDICATIONS:  Current Outpatient Medications  Medication Sig Dispense Refill  . acetaminophen (TYLENOL) 500 MG tablet Take 1,000 mg by mouth 2 (two) times daily.    Marland Kitchen. aspirin EC 325 MG EC tablet Take 1 tablet (325 mg total) by mouth daily. 30 tablet 0  . chlorthalidone (HYGROTON) 25 MG tablet Take 1 tablet (25 mg total) by mouth daily. 90 tablet 3  . lisinopril  (PRINIVIL,ZESTRIL) 20 MG tablet Take 1 tablet (20 mg total) by mouth 2 (two) times daily. 180 tablet 3  . LORazepam (ATIVAN) 0.5 MG tablet TAKE 1 TABLET BY MOUTH TWICE DAILY AS NEEDED FOR ANXIETY. 60 tablet 0  . Nebivolol HCl (BYSTOLIC) 20 MG TABS Take 1 tablet (20 mg total) by mouth daily. 90 tablet 3  . rosuvastatin (CRESTOR) 20 MG tablet TAKE (1) TABLET BY MOUTH ONCE DAILY. 30 tablet 0   No current facility-administered medications for this visit.     PHYSICAL EXAMINATION: ECOG PERFORMANCE STATUS: 1 - Symptomatic but completely ambulatory  Vitals:   11/27/17 1532  BP: 131/68  Pulse: 65  Resp: 16  Temp: 98.6 F (37 C)  SpO2: 99%   Filed Weights   11/27/17 1532  Weight: 186 lb 6.4 oz (84.6 kg)    GENERAL:alert, no distress and comfortable SKIN: skin color, texture, turgor are normal, no rashes or significant lesions EYES: normal, Conjunctiva are pink and non-injected, sclera clear OROPHARYNX:no mucositis, no erythema and lips, buccal mucosa, and tongue normal     LABORATORY DATA:  I have reviewed the data as listed CMP Latest Ref Rng & Units 11/27/2017 11/04/2017 08/24/2017  Glucose 65 - 99 mg/dL 161(W113(H) 960(A110(H) 540(J110(H)  BUN 6 - 20 mg/dL 81(X28(H) 23 91(Y22(H)  Creatinine 0.61 - 1.24 mg/dL 7.821.16 9.561.16 2.131.05  Sodium 135 - 145 mmol/L 137 139 138  Potassium 3.5 - 5.1 mmol/L 4.3 4.7 4.0  Chloride 101 - 111 mmol/L 100(L) 98 99(L)  CO2 22 - 32 mmol/L 25 32 26  Calcium 8.9 - 10.3 mg/dL 9.4 08.610.2 9.8  Total Protein 6.5 - 8.1 g/dL 7.6 7.7  8.2(H)  Total Bilirubin 0.3 - 1.2 mg/dL 1.2 0.8 0.6  Alkaline Phos 38 - 126 U/L 48 - 48  AST 15 - 41 U/L 38 23 28  ALT 17 - 63 U/L 34 20 28   No results found for: CAN153   Lab Results  Component Value Date   WBC 10.5 11/27/2017   HGB 15.9 11/27/2017   HCT 47.5 11/27/2017   MCV 99.6 11/27/2017   PLT 245 11/27/2017   NEUTROABS 7.1 11/27/2017    ASSESSMENT & PLAN:  Elevated hemoglobin (HCC) 1.  Resolved erythrocytosis: - He had elevated  hemoglobin between June and December 2018.  An erythropoietin level was sent at last visit.  His CBC in January and April of this year shows a hemoglobin of 16.9 and 15.9 respectively which was within normal limits.  He uses snuff tobacco about 5 cans/week and has cut down to 1 can/week lately.  His erythropoietin level was 6.5 (2.6- 18.5).  As his hemoglobin has normalized, I do not think any further testing is required at this time.  I will see him back in 6 months and repeat his CBC.     Orders Placed This Encounter  Procedures  . CBC with Differential    Standing Status:   Future    Standing Expiration Date:   11/27/2018  . Lactate dehydrogenase    Standing Status:   Future    Standing Expiration Date:   11/27/2018   The patient has a good understanding of the overall plan. he agrees with it. he will call with any problems that may develop before the next visit here.   Doreatha Massed, MD 11/27/17

## 2017-11-27 NOTE — Patient Instructions (Signed)
[facility] at [facility]  Discharge Instructions:  You were seen by Dr. Katragadda today.   _______________________________________________________________  Thank you for choosing [facility] at [facility] to provide your oncology and hematology care.  To afford each patient quality time with our providers, please arrive at least 15 minutes before your scheduled appointment.  You need to re-schedule your appointment if you arrive 10 or more minutes late.  We strive to give you quality time with our providers, and arriving late affects you and other patients whose appointments are after yours.  Also, if you no show three or more times for appointments you may be dismissed from the clinic.  Again, thank you for choosing [facility] at [facility]. Our hope is that these requests will allow you access to exceptional care and in a timely manner. _______________________________________________________________  If you have questions after your visit, please contact our office at [phone] between the hours of 8:30 a.m. and 5:00 p.m. Voicemails left after 4:30 p.m. will not be returned until the following business day. _______________________________________________________________  For prescription refill requests, have your pharmacy contact our office. _______________________________________________________________  Recommendations made by the consultant and any test results will be sent to your referring physician. _______________________________________________________________ 

## 2017-11-27 NOTE — Assessment & Plan Note (Signed)
1.  Resolved erythrocytosis: - He had elevated hemoglobin between June and December 2018.  An erythropoietin level was sent at last visit.  His CBC in January and April of this year shows a hemoglobin of 16.9 and 15.9 respectively which was within normal limits.  He uses snuff tobacco about 5 cans/week and has cut down to 1 can/week lately.  His erythropoietin level was 6.5 (2.6- 18.5).  As his hemoglobin has normalized, I do not think any further testing is required at this time.  I will see him back in 6 months and repeat his CBC.

## 2017-11-30 ENCOUNTER — Other Ambulatory Visit: Payer: Self-pay | Admitting: Physician Assistant

## 2017-12-11 ENCOUNTER — Other Ambulatory Visit: Payer: Self-pay | Admitting: Physician Assistant

## 2017-12-11 DIAGNOSIS — F411 Generalized anxiety disorder: Secondary | ICD-10-CM

## 2017-12-14 NOTE — Telephone Encounter (Signed)
Last OV 11/04/2017 Last refill 11/02/2017 Ok to refill?

## 2018-01-02 ENCOUNTER — Other Ambulatory Visit: Payer: Self-pay | Admitting: Physician Assistant

## 2018-01-06 ENCOUNTER — Ambulatory Visit: Payer: 59 | Admitting: Cardiovascular Disease

## 2018-02-01 ENCOUNTER — Other Ambulatory Visit: Payer: Self-pay | Admitting: Physician Assistant

## 2018-02-08 ENCOUNTER — Encounter: Payer: Self-pay | Admitting: Physician Assistant

## 2018-02-08 ENCOUNTER — Ambulatory Visit: Payer: 59 | Admitting: Physician Assistant

## 2018-02-08 ENCOUNTER — Other Ambulatory Visit: Payer: Self-pay

## 2018-02-08 VITALS — BP 132/78 | HR 51 | Temp 97.9°F | Resp 14 | Ht 70.0 in | Wt 185.8 lb

## 2018-02-08 DIAGNOSIS — F419 Anxiety disorder, unspecified: Secondary | ICD-10-CM

## 2018-02-08 DIAGNOSIS — Z951 Presence of aortocoronary bypass graft: Secondary | ICD-10-CM | POA: Diagnosis not present

## 2018-02-08 DIAGNOSIS — I10 Essential (primary) hypertension: Secondary | ICD-10-CM

## 2018-02-08 DIAGNOSIS — R739 Hyperglycemia, unspecified: Secondary | ICD-10-CM | POA: Diagnosis not present

## 2018-02-08 DIAGNOSIS — I350 Nonrheumatic aortic (valve) stenosis: Secondary | ICD-10-CM | POA: Diagnosis not present

## 2018-02-08 DIAGNOSIS — D582 Other hemoglobinopathies: Secondary | ICD-10-CM | POA: Diagnosis not present

## 2018-02-08 DIAGNOSIS — E785 Hyperlipidemia, unspecified: Secondary | ICD-10-CM | POA: Diagnosis not present

## 2018-02-08 NOTE — Progress Notes (Signed)
Patient ID: Samuel Cook MRN: [id number], DOB: 06-19-1956, 62 y.o. Date of Encounter: _0 @  Chief Complaint:  No chief complaint on file.   HPI: 62 y.o. year old male     10/20/2016: presents as a New Patient to [facility].   He was seeing Samuel Cook in [place]. Last routine office visit there was in November. Says that he would see him routinely to follow-up his high blood pressure and high cholesterol. Says he has "white coat hypertension" and that "it took him 2 to 3 years to get comfortable enough with Samuel Cook to where he did not have high blood pressure there." However, he also states that he has no blood pressure machine at home therefore was not checking his blood pressure anywhere else.  Says that he would go there for routine office visit every 6 months and would have complete physical exam every 12 months. "Would check prostate etc.".  Works at [employer]. 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 Several of his coworkers see me and have recommended me as his provider.  He brings in his 3 prescription medicine bottles of the prescription medicines he takes on a daily basis. Crestor 20 mg daily  Ativan 0.5 mg daily Lisinopril 10 mg daily  Says that back in 2001 he was laid off from [employer] for 1-1/2 years. At that time provider felt that he was having some anxiety and depression. Says that at that time he was taking the lorazepam 4 times a day but now just takes it one every morning.  I saw some documentation in epic that indicated he had seen cardiology remotely. He says that remotely he went to the ER with chest pain and then follow-up they had him see cardiology but that was in negative evaluation.  Has no specific concerns to address today. States he has no other chronic medical problems that he knows of.  Noted that BP very high today. He states he is having no angina symptoms, even with exertion. No stroke/TIA symptoms---no weakness in  any extremity, no slurred speech etc  Malignant hypertension / Essential hypertension Repeated BP myself---Left: 240/100---Right: 210/90 Gave Clonidine 0.18m here in the office to bring BP down.  Will increase dose of lisinopril from 10 to 253m Will add Norvasc 37m18m Check CMET today F/U OV 2 weeks to recheck BP and BMET - lisinopril (PRINIVIL,ZESTRIL) 20 MG tablet; Take 1 tablet (20 mg total) by mouth daily.  Dispense: 30 tablet; Refill: 0 - amLODipine (NORVASC) 5 MG tablet; Take 1 tablet (5 mg total) by mouth daily.  Dispense: 30 tablet; Refill: 0  Hyperlipidemia, unspecified hyperlipidemia type He is not fasting. Will check LFTs to monitor. Wait to check FLP at future OV - COMPLETE METABOLIC PANEL WITH GFR Murmur I asked if he had been told he had murmur in past, if he had had echo---says no. Obtain echo.  - ECHOCARDIOGRAM COMPLETE; Future    11/03/2016: Today he reports that he is taking the blood pressure medications as directed. He is having no adverse effects. Reports that he went and bought a blood pressure machine so that he can check blood pressure at home. He has written down some readings and has brought those in. First 3 readings documented are from 3/16. Then he has one reading for each day for 3/17, 3/18, and 3/19. 167/89, 171/81, 169/85, 137/84, 159/90, 169/91  He did go for echocardiogram that was ordered at last visit. This was performed on  10/29/2016. The significant finding on this--- is of the aortic valve:  moderately to severely calcified annulus. Moderately thickened, moderately calcified leaflets. Moderate stenosis.  Has no complaints or concerns today. Just keeps talking about the fact that in the past his blood pressure has always been high at doctor's visits and that it took him several years to get comfortable enough with his prior provider to where his blood pressure was not high there. Continues to say that he feels perfectly fine with no  symptoms.  Malignant hypertension/ Essential hypertension Continue lisinopril  27m. Check BMET to f/u after increasing dose at LOV Will increase Norvasc to 174mQD Will add Bystolic 72m36mD F/U OV 2 weeks.  Cont to check blood pressure at home and document readings and bring those to the next visit.  Hyperlipidemia, unspecified hyperlipidemia type He is not fasting. Will check LFTs to monitor. Wait to check FLP at future OV - COMPLETE METABOLIC PANEL WITH GFR  Murmur S/P Echo 10/29/2016-- showing aortic valve: Moderately to severely calcified annulus. Moderately thickened, moderately calcified leaflets. Moderate stenosis.  Aortic valve stenosis, etiology of cardiac valve disease unspecified S/P Echo 10/29/2016-- showing aortic valve: Moderately to severely calcified annulus. Moderately thickened, moderately calcified leaflets. Moderate stenosis. Will refer him to cardiology for follow-up - Ambulatory referral to Cardiology     11/17/2016: Today he brings in blood pressure log --has one reading per day. Readings are as follows: 194/90, 163/94, 172/96, 140/87, 162/95, 171/94, 129/83, 139/83, 134/82, 153/93 Today he reports that he has recently been noticing some discomfort in his left shoulder region. Sleeps on his left side--doesn't know if that is affecting this. He states that he has appointment to see Cardiology April 5. No other concerns to address today.     02/04/2017: HPI: [age]o. y/o male here for CPE.   Today I have reviewed his hospital discharge summary. Hospitalized 12/26/16 - 12/31/16. He had CABG 3 by Samuel Cook/11/18. After reviewing the hospital discharge summary--this indicates that he had no postoperative complications at all. Discharge summary does state to stop amlodipine and fish oil. Was discharged home with aspirin, lisinopril 20 mg daily, lorazepam 0.5 as needed, Bystolic 10 mg daily, omeprazole 20 mg daily when necessary, Crestor 20 mg daily. Also  oxycodone and Tylenol for pain.  Prior to his surgery he had a positive stress test and cardiac catheterization showed evidence of left main obstruction. Also noted to have abnormal aortic valve. Was subsequently scheduled for CABG.  Today patient states that since the surgery--- says that at first when he got home he was having difficulty sleeping at night but then during the day all of a sudden would fall asleep and the next thing he would know he be waking up. However says that all of this has improved and pretty much resolved. Says that his blood pressure medicines have been adjusted and that his lisinopril was increased from once a day to twice a day recently and he had some issues with the pharmacy etc. but has now gotten that straightened out and did confirm that he is now taking the appropriate dose. He also states that his anxiety has been increased ever since he's been home from the hospital. Also says that he has been out of his lorazepam and has had none available since the hospitalization. Is feeling much more anxious than usual. Says his next appointment with cardiology is July 9. States that he is scheduled to see Dr. GerServando Snaremorrow and they're supposed to  discuss therapy and discuss driving again. Says that so far since his surgery he has not been able to drive.  No other concerns to address today. States that he is scheduled to do preventive care/complete physical exam today.   Addendum added---02/16/2017: I was just cc-ed note from PA with [facility]. Their note included that patient had A1c of 6.3 on 12/25/16. I will make a note of this to follow-up at future visits/lab. Noted that glucose was normal on his recent lab with me and suspect that he has improved his diet since CABG.  However I will discuss low carbohydrate diet at future visit and monitor A1c.   AT OV 02/04/2017: Updated Preventive Care.    05/07/2017: Today he reports that he stopped the amlodipine because  this was causing severe headache. Says that his prior PCP had prescribed him that medication in the past and it caused headache and he had to stop it back then. States that more recently he was having severe headache and felt horrible and felt quite certain that it was from that medicine so he has stopped it. Says that once he stopped that medication, the headache resolved. Has had no recurrence of headache. Stopped the amlodipine 2 weeks ago.  Today I did give and review information regarding low carbohydrate diet.  He says that his "wife keeps a bowl of candy there for the grandkids" and that he does eat some of that.  Also does eat quite a bit of bread and other foods with lots of carbs.  Says that he will work on decreasing these foods and making these changes. Today I also explained about hemoglobin A1c and explained that we'll recheck that today and will monitor this in the future as well.  He has no other specific concerns to address today. Reviewed that he had office visit with [facility] on 03/19/17 and office visit with Cardiology 02/23/17.  He is taking his Crestor as directed. No myalgias or other adverse effects. He is taking his Bystolic and lisinopril and other medications as directed and those are causing no adverse effects.    08/06/2017: Today he reports that he is feeling the best he has felt in years.  Says that he feels great. He has been doing very good job with his diet.   Says that he has stopped eating candy and has stopped sodas.  Says that he is eating lots of apples.  Says that using his scales at home he has lost from 200 pounds down to 175 pounds. He is taking his Crestor as directed. No myalgias or other adverse effects. He is taking his Bystolic and lisinopril and other medications as directed and those are causing no adverse effects. He has no specific concerns to address today.  Reports that there are 2 medications that he has to pay over $40 --for each of those  medicines--- Bystolic and the Crestor.  Told him that at each visit I will give him samples of Bystolic to try to offset the cost.    11/04/2017: Today I reviewed the Result Note from his labs performed at last visit with me 08/06/17.   His A1c had decreased from 6.4 to 5.7----with his diet changes----Was told to -- Keep up the good work! Recommended for him to make sure that he was taking the Crestor every day and that he had not accidentally stopped it or something.   --Reviewed that 01/2017 LDL was 56 and on this set of labs was 102.   --Reviewed  that we had made no change to his medicine 01/2017 so made no sense that LDL would be that much higher. I reviewed that labs showed polycythemia.  Placed order for referral to hematology.  At that lab H/H was 17.7/52.5.  Today I also reviewed office note with Hematology from 08/24/17.   "They reviewed that he had some erythrocytosis that appeared to be new developing between June and December of this year.   Concurrently there has been some macrocytosis development without leukocytosis or thrombocythemia.   Differential includes primary or secondary erythrocytosis.   To start evaluation with repeating lab work including erythropoietin.  Due to fatigue and macrocytosis would also obtain TSH level.   Planned for him to return to their clinic in 1 week to review findings.   If erythropoietin level suppressed would pursue primary erythrocytosis assessment such as myeloproliferative neoplasm with mutational testing and bone marrow biopsy.   If erythropoietin was upper normal or elevated would pursue secondary erythrocytosis evaluation including pulmonary function testing, imaging of the chest, abdomen, pelvis looking for emphysema, hepatic, or renal malignancy."  I have reviewed chart and do not see that he went for follow-up office visit with hematology.  Today I also reviewed his Cardiology note from 09/11/17.  It was a routine six-month follow-up  visit. At his visit there that day blood pressure initially was 178/90 and then improved to 164/84 on recheck.   At that visit they increased Bystolic to 20 mg daily.  Note states "consider further titration of chlorthalidone of additional BP control as needed ". Hyperlipidemia is followed by PCP.  Goal LDL less than 70 with known CAD. Regarding his aortic stenosis they would follow this with routine echocardiograms.   --11/04/2017:  He reports that he is quite certain that he is taking the Crestor daily.  Does not think that accidentally ran out of his medicines etc.  Does not think he has been skipping doses.  He is fasting today so we will recheck lab to follow this issue. Today he reports that he did increase the Bystolic as directed by cardiology. They I discussed the hematology information above.  He says that he was supposed to have gone there for a follow-up appointment last Tuesday and " it totally slipped his mind ".  I have told him to go home and schedule follow-up visit with hematology. Today he reports that he is having problems with his right ear.  Says that on Saturday night he was in bed and "felt like an ice pick jabbing his right ear." Has seen some blood from the right ear since then. He has no other specific concerns to address today. Otherwise he has been feeling good.  Having no angina symptoms.    02/08/2018: I have reviewed labs from last visit 10/2017.  A1c was 6.0.   LDL 88. Today he reports that he continues to take the Crestor daily.  Causing no myalgias or other adverse effects. He is taking blood pressure medicines as directed.  Having no lightheadedness or lower extremity edema.  No other adverse effects. Reviewed that the only encounter he has had in epic since his last visit with me was a visit with oncology 11/27/2017 to follow-up elevated hemoglobin/erythrocytosis.  They noted that he uses snuff tobacco and that he had decreased the amount of that significantly.   So reviewed that his erythropoietin had been normal.  At follow-up his hemoglobin had normalized.  They felt that he had needed no further evaluation.  We  will plan to see him back for follow-up in 6 months and recheck CBC at that time. Patient reports that it cost him $800 every time he goes to the oncologist.  Asking if he really needs to go there for that next appointment.  I told him that I will make an note to recheck his CBC at the 80-monthmark and monitor this.  Told him that as long as the blood count remains normal then that is fine.  If it becomes abnormal again then he will need to follow-up with the specialist again.  He voices understanding and agrees.  Last CBC was 11/27/2017.  Due to recheck around 05/2018. He reports that he is continued to avoid the candies and sugar and decreased carbohydrate diet as much as possible. No other specific concerns to address today.     Past Medical History:  Diagnosis Date  . Anxiety   . Aortic stenosis   . Coronary artery disease    a. s/p CABG in 12/2016 with LIMA-LAD, reverse SVG-OM, and reverse SVG-PDA  . GERD (gastroesophageal reflux disease)    occasionally  . Hyperlipidemia   . Hypertension      Home Meds: Outpatient Medications Prior to Visit  Medication Sig Dispense Refill  . acetaminophen (TYLENOL) 500 MG tablet Take 1,000 mg by mouth 2 (two) times daily.    .Marland Kitchenaspirin EC 325 MG EC tablet Take 1 tablet (325 mg total) by mouth daily. 30 tablet 0  . chlorthalidone (HYGROTON) 25 MG tablet Take 1 tablet (25 mg total) by mouth daily. 90 tablet 3  . lisinopril (PRINIVIL,ZESTRIL) 20 MG tablet Take 1 tablet (20 mg total) by mouth 2 (two) times daily. 180 tablet 3  . LORazepam (ATIVAN) 0.5 MG tablet TAKE 1 TABLET BY MOUTH TWICE DAILY AS NEEDED FOR ANXIETY. 60 tablet 2  . Nebivolol HCl (BYSTOLIC) 20 MG TABS Take 1 tablet (20 mg total) by mouth daily. 90 tablet 3  . rosuvastatin (CRESTOR) 20 MG tablet TAKE (1) TABLET BY MOUTH ONCE DAILY. 30  tablet 0   No facility-administered medications prior to visit.       Allergies:  Allergies  Allergen Reactions  . No Known Allergies   . Norvasc [Amlodipine Besylate] Other (See Comments)    Headache    Social History   Socioeconomic History  . Marital status: Married    Spouse name: Not on file  . Number of children: Not on file  . Years of education: Not on file  . Highest education level: Not on file  Occupational History  . Not on file  Social Needs  . Financial resource strain: Not on file  . Food insecurity:    Worry: Not on file    Inability: Not on file  . Transportation needs:    Medical: Not on file    Non-medical: Not on file  Tobacco Use  . Smoking status: Former Smoker    Packs/day: 1.00    Years: 20.00    Pack years: 20.00    Types: Cigarettes    Last attempt to quit: 10/20/1993    Years since quitting: 24.3  . Smokeless tobacco: Current User    Types: Snuff  Substance and Sexual Activity  . Alcohol use: Yes    Comment: rare   . Drug use: Yes    Types: Marijuana, Codeine    Comment: occasionally....takes 1 puff when really stressed  . Sexual activity: Yes  Lifestyle  . Physical activity:    Days per  week: Not on file    Minutes per session: Not on file  . Stress: Not on file  Relationships  . Social connections:    Talks on phone: Not on file    Gets together: Not on file    Attends religious service: Not on file    Active member of club or organization: Not on file    Attends meetings of clubs or organizations: Not on file    Relationship status: Not on file  . Intimate partner violence:    Fear of current or ex partner: Not on file    Emotionally abused: Not on file    Physically abused: Not on file    Forced sexual activity: Not on file  Other Topics Concern  . Not on file  Social History Narrative  . Not on file    Family History  Problem Relation Age of Onset  . Cancer Mother   . Early death Mother   . Stroke Father   .  Birth defects Maternal Aunt   . Hyperlipidemia Maternal Aunt   . Hearing loss Maternal Uncle   . Heart disease Maternal Grandmother      Review of Systems:  See HPI for pertinent ROS. All other ROS negative.    Physical Exam: Blood pressure 132/78, pulse (!) 51, temperature 97.9 F (36.6 C), temperature source Oral, resp. rate 14, height _0  (1.778 m), weight 84.3 kg (185 lb 12.8 oz), SpO2 99 %., There is no height or weight on file to calculate BMI. General:  WNWD WM. Appears in no acute distress. Neck: Supple. No thyromegaly. No lymphadenopathy. Lungs: Clear bilaterally to auscultation without wheezes, rales, or rhonchi. Breathing is unlabored. Heart: RRR . III/ VI murmur Abdomen: Soft, non-tender, non-distended with normoactive bowel sounds. No hepatomegaly. No rebound/guarding. No obvious abdominal masses. Musculoskeletal:  Strength and tone normal for age. Extremities/Skin: Warm and dry.  No LE edema. Neuro: Alert and oriented X 3. Moves all extremities spontaneously. Gait is normal. CNII-XII grossly in tact. Psych:  Responds to questions appropriately with a normal affect.      ASSESSMENT AND PLAN:  62 y.o. year old male with   Reports that there are 2 medications that he has to pay over $40 --for each of those medicines--- Bystolic and the Crestor.  Told him that at each visit I will give him samples of Bystolic to try to offset the cost.    Essential hypertension 11/04/2017:  Cardiology recently increased dose of bysystolic secondary to uncontrolled hypertension.  Their note stated that if blood pressure continued to be uncontrolled they recommended to increase chlorthalidone.  At this time I will increase chlorthalidone.  He had been taking a half of the 25 mg tablet daily.  = 12.5 mg daily.  Will increase to a whole tablet for 25 mg daily. Continue other current BP meds in addition. - COMPLETE METABOLIC PANEL WITH GFR - chlorthalidone (HYGROTON) 25 MG tablet; Take 1  tablet (25 mg total) by mouth daily.  Dispense: 90 tablet; Refill: 3  02/08/2018: BP at goal.  Continue current medications the same with no change.   Hyperlipidemia, unspecified hyperlipidemia type 11/04/2017: Result note 08/06/17 states "have him make sure that he is taking the Crestor every day and that he has not accidentally stopped it or something. --/2018 LDL was 56.  Now it is 102.  We may no change to his meds 01/2017 so this does not make sense." Today patient states that he is pretty certain  that he is taking his Crestor that this is included in his daily medicines and he does not think that he has accidentally been out of this or been skipping this.  Ahead and recheck lab today to monitor this. - COMPLETE METABOLIC PANEL WITH GFR - Lipid panel  02/08/2018: Lipids were at goal at lab 11/04/2017.  LDL 88.  Continue current dose of Crestor.   Hyperglycemia 05/07/2017: Today I did give and review information regarding low carbohydrate diet. He says that his "wife keeps a bowl of candy there for the grandkids" and that he does eat some of that. Also does eat quite a bit of bread and other foods with lots of carbs. Says that he will work on decreasing these foods and making these changes. Today I also explained about hemoglobin A1c and explained that we'll recheck that today and will monitor this in the future as well. - Hemoglobin A1c 08/06/2017: He reports that he has made significant diet changes.  Has stopped all candy and soda.  Is eating a lot of apples.  States that he has lost from 200 pounds to 175 pounds on his scale at home.   (Our scales are reading 181 today but he says at home they are 175. ) Recheck A1c to monitor. 11/04/2017:  At lab 08/06/17 A1c had decreased from 6.4 to 5.7--- with diet changes----keep up the good work! - COMPLETE METABOLIC PANEL WITH GFR - Hemoglobin A1c 02/08/2018: Continue low-carb diet.  Recheck A1c today.   Elevated hemoglobin ([facility]) 11/04/2017: See HPI  from today's visit for details regarding this.  Today I have told him to go home and call and schedule follow-up visit with hematology to complete this evaluation. 02/08/2018:Patient reports that it cost him $800 every time he goes to the oncologist.  Asking if he really needs to go there for that next appointment.  I told him that I will make an note to recheck his CBC at the 24-monthmark and monitor this.  Told him that as long as the blood count remains normal then that is fine.  If it becomes abnormal again then he will need to follow-up with the specialist again.  He voices understanding and agrees. Last CBC was 11/27/2017.  Due to recheck around 05/2018.   Aortic valve stenosis, etiology of cardiac valve disease unspecified S/P Echo 10/29/2016-- showing aortic valve: Moderately to severely calcified annulus. Moderately thickened, moderately calcified leaflets. Moderate stenosis. 02/08/2018:  This is now managed by cardiology.  They will follow/monitor with routine echocardiograms.   Murmur S/P Echo 10/29/2016-- showing aortic valve: Moderately to severely calcified annulus. Moderately thickened, moderately calcified leaflets. Moderate stenosis. 02/08/2018:  This is now managed by Cardiology.  They will follow/monitor with routine echocardiograms.   S/P CABG x 3 02/08/2018:  This is now managed by Cardiology.    Preventive Care:  02/08/2018:---He had CPE with me 02/04/2017---see that note regarding Preventive Care     Plan routine follow-up visit in 3 months.  Follow-up sooner if needed.      SMarin OlpDSusanville PUtah BHighsmith-Rainey Memorial [facility]6/24/2019 8:04 AM

## 2018-02-09 LAB — HEMOGLOBIN A1C
HEMOGLOBIN A1C: 6 %{Hb} — AB (ref <5.7)
Mean Plasma Glucose: 126 (calc)
eAG (mmol/L): 7 (calc)

## 2018-02-17 NOTE — Progress Notes (Signed)
Cardiology Office Note    Date:  02/19/2018   ID:  [place] [birth year], MRN [id number]  PCP:  Dorena Bodo, PA-C  Cardiologist: Charlton Haws, MD    Chief Complaint  Patient presents with  . Follow-up    6 month visit    History of Present Illness:    Samuel Cook is a 62 y.o. male with past medical history of CAD (s/p CABG in 12/2016 with LIMA-LAD, reverse SVG-OM, and reverse SVG-PDA), aortic stenosis (mild to moderate by echo in 10/2016, moderate by echo in 08/2017), HTN, and HLD who presents to the office today for 33-month follow-up.  He was last examined by myself in 08/2017 and denied any recent chest pain or dyspnea on exertion. BP was initially elevated at 178/90 during his visit and slightly improved to 164/84 on recheck. Bystolic was therefore increased from 10 mg daily to 20 mg daily and he was continued on Chlorthalidone 12.5mg  daily and Lisinopril 20mg  BID.  A repeat echocardiogram was obtained following his visit and showed a preserved EF of 55 to 60% with no regional wall motion abnormalities and moderate AS.  In talking with the patient today, he reports overall doing well since his last office visit. He denies any recent chest discomfort or dyspnea on exertion. Has been performing yard work multiple times per week without any anginal symptoms. He denies any recent orthopnea, PND, lower extremity edema, lightheadedness, dizziness, or presyncope.  Reports BP has overall been well controlled when checked at home and during office visits with his PCP. Reports good compliance with his medications.   Past Medical History:  Diagnosis Date  . Anxiety   . Aortic stenosis   . Coronary artery disease    a. s/p CABG in 12/2016 with LIMA-LAD, reverse SVG-OM, and reverse SVG-PDA  . GERD (gastroesophageal reflux disease)    occasionally  . Hyperlipidemia   . Hypertension     Past Surgical History:  Procedure Laterality Date  . CARDIAC CATHETERIZATION    . CORONARY  ARTERY BYPASS GRAFT N/A 12/26/2016   Procedure: CORONARY ARTERY BYPASS GRAFTING (CABG) x 3 WITH ENDOSCOPIC HARVESTING OF RIGHT SAPHENOUS VEIN;  Surgeon: Delight Ovens, MD;  Location: [facility] OR;  Service: Open Heart Surgery;  Laterality: N/A;  . LEFT HEART CATH AND CORONARY ANGIOGRAPHY N/A 12/12/2016   Procedure: Left Heart Cath and Coronary Angiography;  Surgeon: Lyn Records, MD;  Location: [facility] INVASIVE CV LAB;  Service: Cardiovascular;  Laterality: N/A;  . none     none  . TEE WITHOUT CARDIOVERSION N/A 12/26/2016   Procedure: TRANSESOPHAGEAL ECHOCARDIOGRAM (TEE);  Surgeon: Delight Ovens, MD;  Location: [facility] OR;  Service: Open Heart Surgery;  Laterality: N/A;    Current Medications: Outpatient Medications Prior to Visit  Medication Sig Dispense Refill  . acetaminophen (TYLENOL) 500 MG tablet Take 1,000 mg by mouth 2 (two) times daily.    . chlorthalidone (HYGROTON) 25 MG tablet Take 1 tablet (25 mg total) by mouth daily. 90 tablet 3  . lisinopril (PRINIVIL,ZESTRIL) 20 MG tablet Take 1 tablet (20 mg total) by mouth 2 (two) times daily. 180 tablet 3  . LORazepam (ATIVAN) 0.5 MG tablet TAKE 1 TABLET BY MOUTH TWICE DAILY AS NEEDED FOR ANXIETY. 60 tablet 2  . Nebivolol HCl (BYSTOLIC) 20 MG TABS Take 1 tablet (20 mg total) by mouth daily. 90 tablet 3  . rosuvastatin (CRESTOR) 20 MG tablet TAKE (1) TABLET BY MOUTH ONCE DAILY. 30 tablet 0  .  aspirin EC 325 MG EC tablet Take 1 tablet (325 mg total) by mouth daily. 30 tablet 0   No facility-administered medications prior to visit.      Allergies:   No known allergies and Norvasc [amlodipine besylate]   Social History   Socioeconomic History  . Marital status: Married    Spouse name: Not on file  . Number of children: Not on file  . Years of education: Not on file  . Highest education level: Not on file  Occupational History  . Not on file  Social Needs  . Financial resource strain: Not on file  . Food insecurity:    Worry: Not on file      Inability: Not on file  . Transportation needs:    Medical: Not on file    Non-medical: Not on file  Tobacco Use  . Smoking status: Former Smoker    Packs/day: 1.00    Years: 20.00    Pack years: 20.00    Types: Cigarettes    Last attempt to quit: 10/20/1993    Years since quitting: 24.3  . Smokeless tobacco: Current User    Types: Snuff  Substance and Sexual Activity  . Alcohol use: Yes    Alcohol/week: 0.6 oz    Types: 1 Cans of beer per week    Comment: rare   . Drug use: Yes    Types: Marijuana, Codeine    Comment: occasionally....takes 1 puff when really stressed  . Sexual activity: Yes  Lifestyle  . Physical activity:    Days per week: Not on file    Minutes per session: Not on file  . Stress: Not on file  Relationships  . Social connections:    Talks on phone: Not on file    Gets together: Not on file    Attends religious service: Not on file    Active member of club or organization: Not on file    Attends meetings of clubs or organizations: Not on file    Relationship status: Not on file  Other Topics Concern  . Not on file  Social History Narrative  . Not on file     Family History:  The patient's family history includes Birth defects in his maternal aunt; Cancer in his mother; Early death in his mother; Hearing loss in his maternal uncle; Heart disease in his maternal grandmother; Hyperlipidemia in his maternal aunt; Stroke in his father.   Review of Systems:   Please see the history of present illness.     General:  No chills, fever, night sweats or weight changes.  Cardiovascular:  No chest pain, dyspnea on exertion, edema, orthopnea, palpitations, paroxysmal nocturnal dyspnea. Dermatological: No rash, lesions/masses Respiratory: No cough, dyspnea Urologic: No hematuria, dysuria Abdominal:   No nausea, vomiting, diarrhea, bright red blood per rectum, melena, or hematemesis Neurologic:  No visual changes, wkns, changes in mental status.  He denies  any of the above symptoms.   All other systems reviewed and are otherwise negative except as noted above.   Physical Exam:    VS:  BP 138/82   Pulse 68   Ht 5\' 10"  (1.778 m)   Wt 188 lb (85.3 kg)   SpO2 97%   BMI 26.98 kg/m    General: Well developed, well nourished Caucasian male appearing in no acute distress. Head: Normocephalic, atraumatic, sclera non-icteric, no xanthomas, nares are without discharge.  Neck: No carotid bruits. JVD not elevated.  Lungs: Respirations regular and unlabored, without wheezes  or rales.  Heart: Regular rate and rhythm. No S3 or S4.  No rubs or gallops appreciated. 2/6 SEM along RUSB.  Abdomen: Soft, non-tender, non-distended with normoactive bowel sounds. No hepatomegaly. No rebound/guarding. No obvious abdominal masses. Msk:  Strength and tone appear normal for age. No joint deformities or effusions. Extremities: No clubbing or cyanosis. No edema.  Distal pedal pulses are 2+ bilaterally. Neuro: Alert and oriented X 3. Moves all extremities spontaneously. No focal deficits noted. Psych:  Responds to questions appropriately with a normal affect. Skin: No rashes or lesions noted  Wt Readings from Last 3 Encounters:  02/19/18 188 lb (85.3 kg)  02/08/18 185 lb 12.8 oz (84.3 kg)  11/27/17 186 lb 6.4 oz (84.6 kg)     Studies/Labs Reviewed:   EKG:  EKG is not ordered today.   Recent Labs: 08/24/2017: TSH 0.744 11/27/2017: ALT 34; BUN 28; Creatinine, Ser 1.16; Hemoglobin 15.9; Platelets 245; Potassium 4.3; Sodium 137   Lipid Panel    Component Value Date/Time   CHOL 151 11/04/2017 0838   TRIG 87 11/04/2017 0838   HDL 45 11/04/2017 0838   CHOLHDL 3.4 11/04/2017 0838   VLDL 19 02/02/2017 0839   LDLCALC 88 11/04/2017 0838    Additional studies/ records that were reviewed today include:   Echocardiogram: 08/2017 Study Conclusions  - Left ventricle: The cavity size was normal. Wall thickness was   increased in a pattern of moderate LVH.  Systolic function was   normal. The estimated ejection fraction was in the range of 55%   to 60%. Abnormal diastolic function, indeterminant grade. Wall   motion was normal; there were no regional wall motion   abnormalities. - Mitral valve: There was mild regurgitation. - Aortic valve: Moderately to severely calcified annulus. Severely   thickened leaflets. Morphologically the valve appears to have   moderate to severe AS. The CW of the AV is mildly off axis, I   suspect the true mean gradient is in the moderate range. There   was mild to moderate stenosis by quantitative data Mean gradient   (S): 17 mm Hg. VTI ratio of LVOT to aortic valve: 0.35. Valve   area (VTI): 1.33 cm^2. Valve area (Vmax): 1.3 cm^2. Valve area   (Vmean): 1.31 cm^2. - Left atrium: The atrium was mildly dilated. - Technically adequate study.  Assessment:    1. Coronary artery disease involving native coronary artery of native heart without angina pectoris   2. Aortic valve stenosis, etiology of cardiac valve disease unspecified   3. Essential hypertension   4. Hyperlipidemia LDL goal <70      Plan:   In order of problems listed above:  1. CAD - s/p CABG in 12/2016 with LIMA-LAD, reverse SVG-OM, and reverse SVG-PDA.  He denies any recent chest pain or dyspnea on exertion and has been performing his routine activities without any recurrent anginal symptoms. - Continue ASA (reduce dosing from 325mg  daily to 81mg  daily), BB, and statin therapy.   2. Aortic Stenosis - Mild to moderate by echocardiogram in 10/2016 and moderate by most criteria by repeat imaging in 08/2017.  Reviewed this in detail with the patient today. We will continue to follow.  3. HTN - BP initially at 140/90, improved to 138/82 on recheck. He reports this has overall been well-controlled when checked at home. Will continue on Chlorthalidone 25 mg daily, Lisinopril 20 mg twice daily, and Bystolic 20 mg daily.  4. HLD - Followed by his  PCP. Goal LDL  is less than 70 in the setting of known CAD. Remains on Crestor 20 mg daily.      Medication Adjustments/Labs and Tests Ordered: Current medicines are reviewed at length with the patient today.  Concerns regarding medicines are outlined above.  Medication changes, Labs and Tests ordered today are listed in the Patient Instructions below. Patient Instructions  Medication Instructions:  Your physician has recommended you make the following change in your medication:  Decrease Aspirin to 81 mg Daily    Labwork: NONE   Testing/Procedures: NONE   Follow-Up: Your physician wants you to follow-up in: 6 Months with Dr. Eden EmmsNishan. You will receive a reminder letter in the mail two months in advance. If you don't receive a letter, please call our office to schedule the follow-up appointment.   Any Other Special Instructions Will Be Listed Below (If Applicable).  If you need a refill on your cardiac medications before your next appointment, please call your pharmacy. Thank you for choosing [facility]!     Signed, Ellsworth LennoxBrittany M Charleston Hankin, PA-C  02/19/2018 5:30 PM    [facility] [address] [place], [place] [address] Phone: [phone]

## 2018-02-19 ENCOUNTER — Encounter: Payer: Self-pay | Admitting: Student

## 2018-02-19 ENCOUNTER — Ambulatory Visit: Payer: 59 | Admitting: Student

## 2018-02-19 VITALS — BP 138/82 | HR 68 | Ht 70.0 in | Wt 188.0 lb

## 2018-02-19 DIAGNOSIS — I2510 Atherosclerotic heart disease of native coronary artery without angina pectoris: Secondary | ICD-10-CM | POA: Diagnosis not present

## 2018-02-19 DIAGNOSIS — E785 Hyperlipidemia, unspecified: Secondary | ICD-10-CM | POA: Diagnosis not present

## 2018-02-19 DIAGNOSIS — I350 Nonrheumatic aortic (valve) stenosis: Secondary | ICD-10-CM | POA: Diagnosis not present

## 2018-02-19 DIAGNOSIS — I10 Essential (primary) hypertension: Secondary | ICD-10-CM | POA: Diagnosis not present

## 2018-02-19 MED ORDER — ASPIRIN EC 81 MG PO TBEC: 81.0000 mg | DELAYED_RELEASE_TABLET | Freq: Every day | ORAL | 3 refills | Status: DC

## 2018-02-19 NOTE — Patient Instructions (Signed)
Medication Instructions:  Your physician has recommended you make the following change in your medication:  Decrease Aspirin to 81 mg Daily    Labwork: NONE   Testing/Procedures: NONE   Follow-Up: Your physician wants you to follow-up in: 6 Months with Dr. Eden EmmsNishan. You will receive a reminder letter in the mail two months in advance. If you don't receive a letter, please call our office to schedule the follow-up appointment.   Any Other Special Instructions Will Be Listed Below (If Applicable).     If you need a refill on your cardiac medications before your next appointment, please call your pharmacy. Thank you for choosing [facility]!

## 2018-03-05 ENCOUNTER — Other Ambulatory Visit: Payer: Self-pay | Admitting: Physician Assistant

## 2018-04-01 ENCOUNTER — Other Ambulatory Visit: Payer: Self-pay | Admitting: Physician Assistant

## 2018-05-01 ENCOUNTER — Other Ambulatory Visit: Payer: Self-pay | Admitting: Physician Assistant

## 2018-05-13 ENCOUNTER — Encounter: Payer: Self-pay | Admitting: Physician Assistant

## 2018-05-13 ENCOUNTER — Ambulatory Visit: Payer: 59 | Admitting: Physician Assistant

## 2018-05-13 VITALS — BP 142/82 | HR 63 | Temp 97.6°F | Ht 70.0 in | Wt 182.2 lb

## 2018-05-13 DIAGNOSIS — D582 Other hemoglobinopathies: Secondary | ICD-10-CM

## 2018-05-13 DIAGNOSIS — I10 Essential (primary) hypertension: Secondary | ICD-10-CM

## 2018-05-13 DIAGNOSIS — I350 Nonrheumatic aortic (valve) stenosis: Secondary | ICD-10-CM

## 2018-05-13 DIAGNOSIS — E785 Hyperlipidemia, unspecified: Secondary | ICD-10-CM

## 2018-05-13 DIAGNOSIS — Z951 Presence of aortocoronary bypass graft: Secondary | ICD-10-CM | POA: Diagnosis not present

## 2018-05-13 DIAGNOSIS — R739 Hyperglycemia, unspecified: Secondary | ICD-10-CM

## 2018-05-13 DIAGNOSIS — F419 Anxiety disorder, unspecified: Secondary | ICD-10-CM

## 2018-05-13 NOTE — Progress Notes (Signed)
Patient ID: [employer] MRN: [id number], DOB: 27-Jun-1956, 62 y.o. Date of Encounter: '@DATE' @  Chief Complaint:  Chief Complaint  Patient presents with  . Hyperlipidemia  . 3 month follow up    HPI: 62 y.o. year old male     10/20/2016: presents as a New Patient to [place].   He was seeing Dr. Emilee Cook in [place]. Last routine office visit there was in November. Says that he would see him routinely to follow-up his high blood pressure and high cholesterol. Says he has "white coat hypertension" and that "it took him 2 to 3 years to get comfortable enough with Dr. Emilee Cook to where he did not have high blood pressure there." However, he also states that he has no blood pressure machine at home therefore was not checking his blood pressure anywhere else.  Says that he would go there for routine office visit every 6 months and would have complete physical exam every 12 months. "Would check prostate etc.".  Works at [employer]. 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 Several of his coworkers see me and have recommended me as his provider.  He brings in his 3 prescription medicine bottles of the prescription medicines he takes on a daily basis. Crestor 20 mg daily  Ativan 0.5 mg daily Lisinopril 10 mg daily  Says that back in 2001 he was laid off from [employer] for 1-1/2 years. At that time provider felt that he was having some anxiety and depression. Says that at that time he was taking the lorazepam 4 times a day but now just takes it one every morning.  I saw some documentation in epic that indicated he had seen cardiology remotely. He says that remotely he went to the ER with chest pain and then follow-up they had him see cardiology but that was in negative evaluation.  Has no specific concerns to address today. States he has no other chronic medical problems that he knows of.  Noted that BP very high today. He states he is having no angina symptoms, even  with exertion. No stroke/TIA symptoms---no weakness in any extremity, no slurred speech etc  Malignant hypertension / Essential hypertension Repeated BP myself---Left: 240/100---Right: 210/90 Gave Clonidine 0.26m here in the office to bring BP down.  Will increase dose of lisinopril from 10 to 263m Will add Norvasc 2m78m Check CMET today F/U OV 2 weeks to recheck BP and BMET - lisinopril (PRINIVIL,ZESTRIL) 20 MG tablet; Take 1 tablet (20 mg total) by mouth daily.  Dispense: 30 tablet; Refill: 0 - amLODipine (NORVASC) 5 MG tablet; Take 1 tablet (5 mg total) by mouth daily.  Dispense: 30 tablet; Refill: 0  Hyperlipidemia, unspecified hyperlipidemia type He is not fasting. Will check LFTs to monitor. Wait to check FLP at future OV - COMPLETE METABOLIC PANEL WITH GFR Murmur I asked if he had been told he had murmur in past, if he had had echo---says no. Obtain echo.  - ECHOCARDIOGRAM COMPLETE; Future    11/03/2016: Today he reports that he is taking the blood pressure medications as directed. He is having no adverse effects. Reports that he went and bought a blood pressure machine so that he can check blood pressure at home. He has written down some readings and has brought those in. First 3 readings documented are from 3/16. Then he has one reading for each day for 3/17, 3/18, and 3/19. 167/89, 171/81, 169/85, 137/84, 159/90, 169/91  He did go for  echocardiogram that was ordered at last visit. This was performed on 10/29/2016. The significant finding on this--- is of the aortic valve:  moderately to severely calcified annulus. Moderately thickened, moderately calcified leaflets. Moderate stenosis.  Has no complaints or concerns today. Just keeps talking about the fact that in the past his blood pressure has always been high at doctor's visits and that it took him several years to get comfortable enough with his prior provider to where his blood pressure was not high there. Continues to say  that he feels perfectly fine with no symptoms.  Malignant hypertension/ Essential hypertension Continue lisinopril  37m. Check BMET to f/u after increasing dose at LOV Will increase Norvasc to 166mQD Will add Bystolic 63m18mD F/U OV 2 weeks.  Cont to check blood pressure at home and document readings and bring those to the next visit.  Hyperlipidemia, unspecified hyperlipidemia type He is not fasting. Will check LFTs to monitor. Wait to check FLP at future OV - COMPLETE METABOLIC PANEL WITH GFR  Murmur S/P Echo 10/29/2016-- showing aortic valve: Moderately to severely calcified annulus. Moderately thickened, moderately calcified leaflets. Moderate stenosis.  Aortic valve stenosis, etiology of cardiac valve disease unspecified S/P Echo 10/29/2016-- showing aortic valve: Moderately to severely calcified annulus. Moderately thickened, moderately calcified leaflets. Moderate stenosis. Will refer him to cardiology for follow-up - Ambulatory referral to Cardiology     11/17/2016: Today he brings in blood pressure log --has one reading per day. Readings are as follows: 194/90, 163/94, 172/96, 140/87, 162/95, 171/94, 129/83, 139/83, 134/82, 153/93 Today he reports that he has recently been noticing some discomfort in his left shoulder region. Sleeps on his left side--doesn't know if that is affecting this. He states that he has appointment to see Cardiology April 5. No other concerns to address today.     02/04/2017: HPI: 62 y.o male y/o male here for CPE.   Today I have reviewed his hospital discharge summary. Hospitalized 12/26/16 - 12/31/16. He had CABG 3 by Dr. GerServando Cook/11/18. After reviewing the hospital discharge summary--this indicates that he had no postoperative complications at all. Discharge summary does state to stop amlodipine and fish oil. Was discharged home with aspirin, lisinopril 20 mg daily, lorazepam 0.5 as needed, Bystolic 10 mg daily, omeprazole 20 mg daily when  necessary, Crestor 20 mg daily. Also oxycodone and Tylenol for pain.  Prior to his surgery he had a positive stress test and cardiac catheterization showed evidence of left main obstruction. Also noted to have abnormal aortic valve. Was subsequently scheduled for CABG.  Today patient states that since the surgery--- says that at first when he got home he was having difficulty sleeping at night but then during the day all of a sudden would fall asleep and the next thing he would know he be waking up. However says that all of this has improved and pretty much resolved. Says that his blood pressure medicines have been adjusted and that his lisinopril was increased from once a day to twice a day recently and he had some issues with the pharmacy etc. but has now gotten that straightened out and did confirm that he is now taking the appropriate dose. He also states that his anxiety has been increased ever since he's been home from the hospital. Also says that he has been out of his lorazepam and has had none available since the hospitalization. Is feeling much more anxious than usual. Says his next appointment with cardiology is July 9. States that he  is scheduled to see Dr. Servando Cook tomorrow and they're supposed to discuss therapy and discuss driving again. Says that so far since his surgery he has not been able to drive.  No other concerns to address today. States that he is scheduled to do preventive care/complete physical exam today.   Addendum added---02/16/2017: I was just cc-ed note from PA with [facility]. Their note included that patient had A1c of 6.3 on 12/25/16. I will make a note of this to follow-up at future visits/lab. Noted that glucose was normal on his recent lab with me and suspect that he has improved his diet since CABG.  However I will discuss low carbohydrate diet at future visit and monitor A1c.   AT OV 02/04/2017: Updated Preventive Care.    05/07/2017: Today he reports  that he stopped the amlodipine because this was causing severe headache. Says that his prior PCP had prescribed him that medication in the past and it caused headache and he had to stop it back then. States that more recently he was having severe headache and felt horrible and felt quite certain that it was from that medicine so he has stopped it. Says that once he stopped that medication, the headache resolved. Has had no recurrence of headache. Stopped the amlodipine 2 weeks ago.  Today I did give and review information regarding low carbohydrate diet.  He says that his "wife keeps a bowl of candy there for the grandkids" and that he does eat some of that.  Also does eat quite a bit of bread and other foods with lots of carbs.  Says that he will work on decreasing these foods and making these changes. Today I also explained about hemoglobin A1c and explained that we'll recheck that today and will monitor this in the future as well.  He has no other specific concerns to address today. Reviewed that he had office visit with [facility] on 03/19/17 and office visit with Cardiology 02/23/17.  He is taking his Crestor as directed. No myalgias or other adverse effects. He is taking his Bystolic and lisinopril and other medications as directed and those are causing no adverse effects.    08/06/2017: Today he reports that he is feeling the best he has felt in years.  Says that he feels great. He has been doing very good job with his diet.   Says that he has stopped eating candy and has stopped sodas.  Says that he is eating lots of apples.  Says that using his scales at home he has lost from 200 pounds down to 175 pounds. He is taking his Crestor as directed. No myalgias or other adverse effects. He is taking his Bystolic and lisinopril and other medications as directed and those are causing no adverse effects. He has no specific concerns to address today.  Reports that there are 2 medications that he has to  pay over $40 --for each of those medicines--- Bystolic and the Crestor.  Told him that at each visit I will give him samples of Bystolic to try to offset the cost.    11/04/2017: Today I reviewed the Result Note from his labs performed at last visit with me 08/06/17.   His A1c had decreased from 6.4 to 5.7----with his diet changes----Was told to -- Keep up the good work! Recommended for him to make sure that he was taking the Crestor every day and that he had not accidentally stopped it or something.   --Reviewed that 01/2017 LDL was 56  and on this set of labs was 102.   --Reviewed that we had made no change to his medicine 01/2017 so made no sense that LDL would be that much higher. I reviewed that labs showed polycythemia.  Placed order for referral to hematology.  At that lab H/H was 17.7/52.5.  Today I also reviewed office note with Hematology from 08/24/17.   "They reviewed that he had some erythrocytosis that appeared to be new developing between June and December of this year.   Concurrently there has been some macrocytosis development without leukocytosis or thrombocythemia.   Differential includes primary or secondary erythrocytosis.   To start evaluation with repeating lab work including erythropoietin.  Due to fatigue and macrocytosis would also obtain TSH level.   Planned for him to return to their clinic in 1 week to review findings.   If erythropoietin level suppressed would pursue primary erythrocytosis assessment such as myeloproliferative neoplasm with mutational testing and bone marrow biopsy.   If erythropoietin was upper normal or elevated would pursue secondary erythrocytosis evaluation including pulmonary function testing, imaging of the chest, abdomen, pelvis looking for emphysema, hepatic, or renal malignancy."  I have reviewed chart and do not see that he went for follow-up office visit with hematology.  Today I also reviewed his Cardiology note from 09/11/17.  It was a  routine six-month follow-up visit. At his visit there that day blood pressure initially was 178/90 and then improved to 164/84 on recheck.   At that visit they increased Bystolic to 20 mg daily.  Note states "consider further titration of chlorthalidone of additional BP control as needed ". Hyperlipidemia is followed by PCP.  Goal LDL less than 70 with known CAD. Regarding his aortic stenosis they would follow this with routine echocardiograms.   --11/04/2017:  He reports that he is quite certain that he is taking the Crestor daily.  Does not think that accidentally ran out of his medicines etc.  Does not think he has been skipping doses.  He is fasting today so we will recheck lab to follow this issue. Today he reports that he did increase the Bystolic as directed by cardiology. They I discussed the hematology information above.  He says that he was supposed to have gone there for a follow-up appointment last Tuesday and " it totally slipped his mind ".  I have told him to go home and schedule follow-up visit with hematology. Today he reports that he is having problems with his right ear.  Says that on Saturday night he was in bed and "felt like an ice pick jabbing his right ear." Has seen some blood from the right ear since then. He has no other specific concerns to address today. Otherwise he has been feeling good.  Having no angina symptoms.    02/08/2018: I have reviewed labs from last visit 10/2017.  A1c was 6.0.   LDL 88. Today he reports that he continues to take the Crestor daily.  Causing no myalgias or other adverse effects. He is taking blood pressure medicines as directed.  Having no lightheadedness or lower extremity edema.  No other adverse effects. Reviewed that the only encounter he has had in epic since his last visit with me was a visit with oncology 11/27/2017 to follow-up elevated hemoglobin/erythrocytosis.  They noted that he uses snuff tobacco and that he had decreased the  amount of that significantly.  So reviewed that his erythropoietin had been normal.  At follow-up his hemoglobin had normalized.  They felt that he had needed no further evaluation.  We will plan to see him back for follow-up in 6 months and recheck CBC at that time. Patient reports that it cost him $800 every time he goes to the oncologist.  Asking if he really needs to go there for that next appointment.  I told him that I will make an note to recheck his CBC at the 12-monthmark and monitor this.  Told him that as long as the blood count remains normal then that is fine.  If it becomes abnormal again then he will need to follow-up with the specialist again.  He voices understanding and agrees.  Last CBC was 11/27/2017.  Due to recheck around 05/2018. He reports that he is continued to avoid the candies and sugar and decreased carbohydrate diet as much as possible. No other specific concerns to address today.   05/13/2018: Today he reports that he continues to take the Crestor daily.  Causing no myalgias or other adverse effects. He is taking blood pressure medicines as directed.  Having no lightheadedness or lower extremity edema.  No other adverse effects. He reports that he is continued to avoid the candies and sugar and decreased carbohydrate diet as much as possible. He reports that he has been feeling good.  He has no specific concerns to address today.  [place]     Past Medical History:  Diagnosis Date  . Anxiety   . Aortic stenosis   . Coronary artery disease    a. s/p CABG in 12/2016 with LIMA-LAD, reverse SVG-OM, and reverse SVG-PDA  . GERD (gastroesophageal reflux disease)    occasionally  . Hyperlipidemia   . Hypertension      Home Meds: Outpatient Medications Prior to Visit  Medication Sig Dispense Refill  . acetaminophen (TYLENOL) 500 MG tablet Take 1,000 mg by mouth 2 (two) times daily.    .Marland Kitchenaspirin EC 81 MG tablet Take 1  tablet (81 mg total) by mouth daily. 90 tablet 3  . chlorthalidone (HYGROTON) 25 MG tablet Take 1 tablet (25 mg total) by mouth daily. 90 tablet 3  . lisinopril (PRINIVIL,ZESTRIL) 20 MG tablet Take 1 tablet (20 mg total) by mouth 2 (two) times daily. 180 tablet 3  . LORazepam (ATIVAN) 0.5 MG tablet TAKE 1 TABLET BY MOUTH TWICE DAILY AS NEEDED FOR ANXIETY. 60 tablet 2  . Nebivolol HCl (BYSTOLIC) 20 MG TABS Take 1 tablet (20 mg total) by mouth daily. 90 tablet 3  . rosuvastatin (CRESTOR) 20 MG tablet TAKE (1) TABLET BY MOUTH ONCE DAILY. 30 tablet 0   No facility-administered medications prior to visit.       Allergies:  Allergies  Allergen Reactions  . No Known Allergies   . Norvasc [Amlodipine Besylate] Other (See Comments)    Headache    Social History   Socioeconomic History  . Marital status: Married    Spouse name: Not on file  . Number of children: Not on file  . Years of education: Not on file  . Highest education level: Not on file  Occupational History  . Not on file  Social Needs  . Financial resource strain: Not on file  . Food insecurity:    Worry: Not on file    Inability: Not on file  . Transportation needs:    Medical: Not on file    Non-medical: Not on file  Tobacco Use  . Smoking status:  Former Smoker    Packs/day: 1.00    Years: 20.00    Pack years: 20.00    Types: Cigarettes    Last attempt to quit: 10/20/1993    Years since quitting: 24.5  . Smokeless tobacco: Current User    Types: Snuff  Substance and Sexual Activity  . Alcohol use: Yes    Alcohol/week: 1.0 standard drinks    Types: 1 Cans of beer per week    Comment: rare   . Drug use: Yes    Types: Marijuana, Codeine    Comment: occasionally....takes 1 puff when really stressed  . Sexual activity: Yes  Lifestyle  . Physical activity:    Days per week: Not on file    Minutes per session: Not on file  . Stress: Not on file  Relationships  . Social connections:    Talks on phone: Not on  file    Gets together: Not on file    Attends religious service: Not on file    Active member of club or organization: Not on file    Attends meetings of clubs or organizations: Not on file    Relationship status: Not on file  . Intimate partner violence:    Fear of current or ex partner: Not on file    Emotionally abused: Not on file    Physically abused: Not on file    Forced sexual activity: Not on file  Other Topics Concern  . Not on file  Social History Narrative  . Not on file    Family History  Problem Relation Age of Onset  . Cancer Mother   . Early death Mother   . Stroke Father   . Birth defects Maternal Aunt   . Hyperlipidemia Maternal Aunt   . Hearing loss Maternal Uncle   . Heart disease Maternal Grandmother      Review of Systems:  See HPI for pertinent ROS. All other ROS negative.    Physical Exam: Blood pressure (!) 142/82, pulse 63, temperature 97.6 F (36.4 C), temperature source Oral, height '5\' 10"'  (1.778 m), weight 82.6 kg, SpO2 100 %., Body mass index is 26.14 kg/m. General:  WNWD WM. Appears in no acute distress. Neck: Supple. No thyromegaly. No lymphadenopathy. No carotid bruits. Lungs: Clear bilaterally to auscultation without wheezes, rales, or rhonchi. Breathing is unlabored. Heart: RRR. III/VI murmur Abdomen: Soft, non-tender, non-distended with normoactive bowel sounds. No hepatomegaly. No rebound/guarding. No obvious abdominal masses. Musculoskeletal:  Strength and tone normal for age. Extremities/Skin: Warm and dry.  No LE edema.  Neuro: Alert and oriented X 3. Moves all extremities spontaneously. Gait is normal. CNII-XII grossly in tact. Psych:  Responds to questions appropriately with a normal affect.      ASSESSMENT AND PLAN:  62 y.o. year old male with   Reports that there are 2 medications that he has to pay over $40 --for each of those medicines--- Bystolic and the Crestor.  Told him that at each visit I will give him samples of  Bystolic to try to offset the cost.   Erythrocytosis 02/08/2018: Reviewed his note with oncology 11/27/2017 to follow-up elevated hemoglobin/erythrocytosis.  They noted that he uses snuff tobacco and that he had decreased the amount of that significantly.  So reviewed that his erythropoietin had been normal.  At follow-up his hemoglobin had normalized.  They felt that he had needed no further evaluation.  We will plan to see him back for follow-up in 6 months and recheck CBC at  that time. Patient reports that it cost him $800 every time he goes to the oncologist.  Asking if he really needs to go there for that next appointment.  I told him that I will make an note to recheck his CBC at the 71-monthmark and monitor this.  Told him that as long as the blood count remains normal then that is fine.  If it becomes abnormal again then he will need to follow-up with the specialist again.  He voices understanding and agrees.  Last CBC was 11/27/2017.  Due to recheck around 05/2018. 05/13/2018:  Check CBC today to monitor.     Essential hypertension 11/04/2017:  Cardiology recently increased dose of bysystolic secondary to uncontrolled hypertension.  Their note stated that if blood pressure continued to be uncontrolled they recommended to increase chlorthalidone.  At this time I will increase chlorthalidone.  He had been taking a half of the 25 mg tablet daily.  = 12.5 mg daily.  Will increase to a whole tablet for 25 mg daily. Continue other current BP meds in addition. - COMPLETE METABOLIC PANEL WITH GFR - chlorthalidone (HYGROTON) 25 MG tablet; Take 1 tablet (25 mg total) by mouth daily.  Dispense: 90 tablet; Refill: 3  02/08/2018: BP at goal.  Continue current medications the same with no change. 05/13/2018: BP at goal.  Continue current medications the same with no change.  Check lab to monitor.    Hyperlipidemia, unspecified hyperlipidemia type 11/04/2017: Result note 08/06/17 states "have him make sure  that he is taking the Crestor every day and that he has not accidentally stopped it or something. --/2018 LDL was 56.  Now it is 102.  We may no change to his meds 01/2017 so this does not make sense." Today patient states that he is pretty certain that he is taking his Crestor that this is included in his daily medicines and he does not think that he has accidentally been out of this or been skipping this.  Ahead and recheck lab today to monitor this. - COMPLETE METABOLIC PANEL WITH GFR - Lipid panel  02/08/2018: Lipids were at goal at lab 11/04/2017.  LDL 88.  Continue current dose of Crestor. 05/13/2018: Lipids were at goal at lab 11/04/2017.  LDL 88.  Continue current dose of Crestor.  Recheck FLP/LFT today to monitor.    Hyperglycemia 05/07/2017: Today I did give and review information regarding low carbohydrate diet. He says that his "wife keeps a bowl of candy there for the grandkids" and that he does eat some of that. Also does eat quite a bit of bread and other foods with lots of carbs. Says that he will work on decreasing these foods and making these changes. Today I also explained about hemoglobin A1c and explained that we'll recheck that today and will monitor this in the future as well. - Hemoglobin A1c 08/06/2017: He reports that he has made significant diet changes.  Has stopped all candy and soda.  Is eating a lot of apples.  States that he has lost from 200 pounds to 175 pounds on his scale at home.   (Our scales are reading 181 today but he says at home they are 175. ) Recheck A1c to monitor. 11/04/2017:  At lab 08/06/17 A1c had decreased from 6.4 to 5.7--- with diet changes----keep up the good work! - COMPLETE METABOLIC PANEL WITH GFR - Hemoglobin A1c 02/08/2018: Continue low-carb diet.  Recheck A1c today. 05/13/2018: Continue low-carb diet.  Recheck A1c today.  Elevated hemoglobin ([place]) 11/04/2017: See HPI from today's visit for details regarding this.  Today I have told him to  go home and call and schedule follow-up visit with hematology to complete this evaluation. 02/08/2018:Patient reports that it cost him $800 every time he goes to the oncologist.  Asking if he really needs to go there for that next appointment.  I told him that I will make an note to recheck his CBC at the 77-monthmark and monitor this.  Told him that as long as the blood count remains normal then that is fine.  If it becomes abnormal again then he will need to follow-up with the specialist again.  He voices understanding and agrees. Last CBC was 11/27/2017.  Due to recheck around 05/2018. 05/13/2018: Recheck CBC today to monitor    Aortic valve stenosis, etiology of cardiac valve disease unspecified S/P Echo 10/29/2016-- showing aortic valve: Moderately to severely calcified annulus. Moderately thickened, moderately calcified leaflets. Moderate stenosis. 02/08/2018:  This is now managed by cardiology.  They will follow/monitor with routine echocardiograms. 05/13/2018: This is now managed by cardiology.  They will follow/monitor with routine echocardiograms.    Murmur S/P Echo 10/29/2016-- showing aortic valve: Moderately to severely calcified annulus. Moderately thickened, moderately calcified leaflets. Moderate stenosis. 02/08/2018:  This is now managed by Cardiology.  They will follow/monitor with routine echocardiograms. 05/13/2018: This is now managed by cardiology.  They will follow/monitor with routine echocardiograms.     S/P CABG x 3 02/08/2018:  This is now managed by Cardiology. 05/13/2018: This is now managed by Cardiology.    Preventive Care:  02/08/2018:---He had CPE with me 02/04/2017---see that note regarding Preventive Care     Plan routine follow-up visit in 3 months.  Follow-up sooner if needed.      Signed, [id number] [facility] [facility]9/26/2019 8:01 AM

## 2018-05-14 LAB — LIPID PANEL
Cholesterol: 158 mg/dL (ref <200)
HDL: 45 mg/dL (ref >40)
LDL Cholesterol (Calc): 94 mg/dL (calc)
NON-HDL CHOLESTEROL (CALC): 113 mg/dL (ref <130)
TRIGLYCERIDES: 98 mg/dL (ref <150)
Total CHOL/HDL Ratio: 3.5 (calc) (ref <5.0)

## 2018-05-14 LAB — CBC WITH DIFFERENTIAL/PLATELET
Basophils Absolute: 70 cells/uL (ref 0–200)
Basophils Relative: 0.8 %
EOS PCT: 1.5 %
Eosinophils Absolute: 132 cells/uL (ref 15–500)
HEMATOCRIT: 49.3 % (ref 38.5–50.0)
HEMOGLOBIN: 16.8 g/dL (ref 13.2–17.1)
LYMPHS ABS: 2042 {cells}/uL (ref 850–3900)
MCH: 33.7 pg — ABNORMAL HIGH (ref 27.0–33.0)
MCHC: 34.1 g/dL (ref 32.0–36.0)
MCV: 99 fL (ref 80.0–100.0)
MPV: 9.6 fL (ref 7.5–12.5)
Monocytes Relative: 9.3 %
NEUTROS ABS: 5738 {cells}/uL (ref 1500–7800)
Neutrophils Relative %: 65.2 %
Platelets: 283 10*3/uL (ref 140–400)
RBC: 4.98 10*6/uL (ref 4.20–5.80)
RDW: 11.5 % (ref 11.0–15.0)
Total Lymphocyte: 23.2 %
WBC mixed population: 818 cells/uL (ref 200–950)
WBC: 8.8 10*3/uL (ref 3.8–10.8)

## 2018-05-14 LAB — HEMOGLOBIN A1C
Hgb A1c MFr Bld: 6.1 % of total Hgb — ABNORMAL HIGH (ref <5.7)
Mean Plasma Glucose: 128 (calc)
eAG (mmol/L): 7.1 (calc)

## 2018-05-14 LAB — COMPLETE METABOLIC PANEL WITH GFR
AG Ratio: 1.8 (calc) (ref 1.0–2.5)
ALT: 25 U/L (ref 9–46)
AST: 29 U/L (ref 10–35)
Albumin: 4.8 g/dL (ref 3.6–5.1)
Alkaline phosphatase (APISO): 46 U/L (ref 40–115)
BUN: 23 mg/dL (ref 7–25)
CALCIUM: 10.2 mg/dL (ref 8.6–10.3)
CO2: 30 mmol/L (ref 20–32)
CREATININE: 1.12 mg/dL (ref 0.70–1.25)
Chloride: 101 mmol/L (ref 98–110)
GFR, EST NON AFRICAN AMERICAN: 71 mL/min/{1.73_m2} (ref >=60)
GFR, Est African American: 82 mL/min/{1.73_m2} (ref >=60)
GLOBULIN: 2.6 g/dL (ref 1.9–3.7)
Glucose, Bld: 104 mg/dL — ABNORMAL HIGH (ref 65–99)
Potassium: 5.1 mmol/L (ref 3.5–5.3)
SODIUM: 139 mmol/L (ref 135–146)
TOTAL PROTEIN: 7.4 g/dL (ref 6.1–8.1)
Total Bilirubin: 0.7 mg/dL (ref 0.2–1.2)

## 2018-05-28 ENCOUNTER — Other Ambulatory Visit (HOSPITAL_COMMUNITY): Payer: 59

## 2018-06-04 ENCOUNTER — Ambulatory Visit (HOSPITAL_COMMUNITY): Payer: 59 | Admitting: Hematology

## 2018-06-07 ENCOUNTER — Other Ambulatory Visit: Payer: Self-pay | Admitting: Physician Assistant

## 2018-06-07 ENCOUNTER — Other Ambulatory Visit: Payer: Self-pay | Admitting: Student

## 2018-07-02 ENCOUNTER — Other Ambulatory Visit: Payer: Self-pay | Admitting: Family Medicine

## 2018-07-02 ENCOUNTER — Other Ambulatory Visit: Payer: Self-pay | Admitting: Physician Assistant

## 2018-07-02 NOTE — Telephone Encounter (Signed)
Ok to refill??  Last office visit 05/13/2018.  Last refill 12/14/2017, #2 refills.

## 2018-08-10 ENCOUNTER — Other Ambulatory Visit: Payer: Self-pay | Admitting: Family Medicine

## 2018-08-10 ENCOUNTER — Other Ambulatory Visit: Payer: Self-pay | Admitting: Student

## 2018-08-10 NOTE — Telephone Encounter (Signed)
This is a [place] pt.  °

## 2018-08-30 ENCOUNTER — Other Ambulatory Visit: Payer: Self-pay | Admitting: Family Medicine

## 2018-08-31 NOTE — Telephone Encounter (Signed)
Requesting refill    Lorazepam  LOV: 05/13/18  LRF:  07/02/18

## 2018-09-02 ENCOUNTER — Ambulatory Visit: Payer: 59 | Admitting: Family Medicine

## 2018-09-02 ENCOUNTER — Encounter: Payer: Self-pay | Admitting: Family Medicine

## 2018-09-02 ENCOUNTER — Ambulatory Visit: Payer: 59 | Admitting: Physician Assistant

## 2018-09-02 VITALS — BP 124/72 | HR 60 | Temp 98.0°F | Resp 16 | Ht 70.0 in | Wt 181.0 lb

## 2018-09-02 DIAGNOSIS — I350 Nonrheumatic aortic (valve) stenosis: Secondary | ICD-10-CM | POA: Diagnosis not present

## 2018-09-02 DIAGNOSIS — F419 Anxiety disorder, unspecified: Secondary | ICD-10-CM

## 2018-09-02 DIAGNOSIS — Z1159 Encounter for screening for other viral diseases: Secondary | ICD-10-CM | POA: Diagnosis not present

## 2018-09-02 DIAGNOSIS — E785 Hyperlipidemia, unspecified: Secondary | ICD-10-CM

## 2018-09-02 DIAGNOSIS — Z951 Presence of aortocoronary bypass graft: Secondary | ICD-10-CM

## 2018-09-02 DIAGNOSIS — I10 Essential (primary) hypertension: Secondary | ICD-10-CM | POA: Diagnosis not present

## 2018-09-02 DIAGNOSIS — Z125 Encounter for screening for malignant neoplasm of prostate: Secondary | ICD-10-CM

## 2018-09-02 DIAGNOSIS — R7303 Prediabetes: Secondary | ICD-10-CM

## 2018-09-02 NOTE — Progress Notes (Signed)
Cardiology Office Note    Date:  09/02/2018   ID:  Samuel Cook [birth year], MRN [id number]  PCP:  Dorena Bodo, PA-C  Cardiologist: Charlton Haws, MD    No chief complaint on file.   History of Present Illness:    63 y.o. with history of CAD CABG May 2018  with LIMA-LAD, reverse SVG-OM, and reverse SVG-PDA), aortic stenosis  Moderate with mean gradient 17 mmHg by TTE 08/2017 , HTN, and HLD who presents to the office today for 4-month follow-up.  Compliant with meds BP at home fine No angina, dyspnea palpitations or syncope thinks taking liisinopril bid is causing headache Told him to cut back to daily and if BP sneaks up we can change to cozaar 100 mg daily  Discussed possible role of TAVR if valve progresses to severe AS    Past Medical History:  Diagnosis Date  . Anxiety   . Aortic stenosis   . Coronary artery disease    a. s/p CABG in 12/2016 with LIMA-LAD, reverse SVG-OM, and reverse SVG-PDA  . GERD (gastroesophageal reflux disease)    occasionally  . Hyperlipidemia   . Hypertension     Past Surgical History:  Procedure Laterality Date  . CARDIAC CATHETERIZATION    . CORONARY ARTERY BYPASS GRAFT N/A 12/26/2016   Procedure: CORONARY ARTERY BYPASS GRAFTING (CABG) x 3 WITH ENDOSCOPIC HARVESTING OF RIGHT SAPHENOUS VEIN;  Surgeon: Delight Ovens, MD;  Location: [facility] OR;  Service: Open Heart Surgery;  Laterality: N/A;  . LEFT HEART CATH AND CORONARY ANGIOGRAPHY N/A 12/12/2016   Procedure: Left Heart Cath and Coronary Angiography;  Surgeon: Lyn Records, MD;  Location: [facility] INVASIVE CV LAB;  Service: Cardiovascular;  Laterality: N/A;  . none     none  . TEE WITHOUT CARDIOVERSION N/A 12/26/2016   Procedure: TRANSESOPHAGEAL ECHOCARDIOGRAM (TEE);  Surgeon: Delight Ovens, MD;  Location: [facility] OR;  Service: Open Heart Surgery;  Laterality: N/A;    Current Medications: Outpatient Medications Prior to Visit  Medication Sig Dispense Refill  . acetaminophen (TYLENOL) 500  MG tablet Take 1,000 mg by mouth 2 (two) times daily.    Marland Kitchen aspirin EC 81 MG tablet Take 1 tablet (81 mg total) by mouth daily. 90 tablet 3  . BYSTOLIC 20 MG TABS TAKE (1) TABLET BY MOUTH ONCE DAILY. 30 tablet 6  . chlorthalidone (HYGROTON) 25 MG tablet Take 1 tablet (25 mg total) by mouth daily. 90 tablet 3  . lisinopril (PRINIVIL,ZESTRIL) 20 MG tablet Take 1 tablet (20 mg total) by mouth 2 (two) times daily. 180 tablet 3  . LORazepam (ATIVAN) 0.5 MG tablet TAKE 1 TABLET BY MOUTH TWICE DAILY AS NEEDED FOR ANXIETY. 60 tablet 0  . rosuvastatin (CRESTOR) 20 MG tablet TAKE (1) TABLET BY MOUTH ONCE DAILY. 30 tablet 0   No facility-administered medications prior to visit.      Allergies:   No known allergies and Norvasc [amlodipine besylate]   Social History   Socioeconomic History  . Marital status: Married    Spouse name: Not on file  . Number of children: Not on file  . Years of education: Not on file  . Highest education level: Not on file  Occupational History  . Not on file  Social Needs  . Financial resource strain: Not on file  . Food insecurity:    Worry: Not on file    Inability: Not on file  . Transportation needs:    Medical: Not on file  Non-medical: Not on file  Tobacco Use  . Smoking status: Former Smoker    Packs/day: 1.00    Years: 20.00    Pack years: 20.00    Types: Cigarettes    Last attempt to quit: 10/20/1993    Years since quitting: 24.8  . Smokeless tobacco: Current User    Types: Snuff  Substance and Sexual Activity  . Alcohol use: Yes    Alcohol/week: 1.0 standard drinks    Types: 1 Cans of beer per week    Comment: rare   . Drug use: Yes    Types: Marijuana, Codeine    Comment: occasionally....takes 1 puff when really stressed  . Sexual activity: Yes  Lifestyle  . Physical activity:    Days per week: Not on file    Minutes per session: Not on file  . Stress: Not on file  Relationships  . Social connections:    Talks on phone: Not on file      Gets together: Not on file    Attends religious service: Not on file    Active member of club or organization: Not on file    Attends meetings of clubs or organizations: Not on file    Relationship status: Not on file  Other Topics Concern  . Not on file  Social History Narrative  . Not on file     Family History:  The patient's family history includes Birth defects in his maternal aunt; Cancer in his mother; Early death in his mother; Hearing loss in his maternal uncle; Heart disease in his maternal grandmother; Hyperlipidemia in his maternal aunt; Stroke in his father.   Review of Systems:   Please see the history of present illness.     General:  No chills, fever, night sweats or weight changes.  Cardiovascular:  No chest pain, dyspnea on exertion, edema, orthopnea, palpitations, paroxysmal nocturnal dyspnea. Dermatological: No rash, lesions/masses Respiratory: No cough, dyspnea Urologic: No hematuria, dysuria Abdominal:   No nausea, vomiting, diarrhea, bright red blood per rectum, melena, or hematemesis Neurologic:  No visual changes, wkns, changes in mental status.  He denies any of the above symptoms.   All other systems reviewed and are otherwise negative except as noted above.   Physical Exam:    VS:  There were no vitals taken for this visit.   Affect appropriate Healthy:  appears stated age HEENT: normal Neck supple with no adenopathy JVP normal no bruits no thyromegaly Lungs clear with no wheezing and good diaphragmatic motion Heart:  S1/S2 AS  murmur, no rub, gallop or click PMI normal Abdomen: benighn, BS positve, no tenderness, no AAA no bruit.  No HSM or HJR Distal pulses intact with no bruits No edema Neuro non-focal Skin warm and dry No muscular weakness   Wt Readings from Last 3 Encounters:  09/02/18 181 lb (82.1 kg)  05/13/18 182 lb 3.2 oz (82.6 kg)  02/19/18 188 lb (85.3 kg)     Studies/Labs Reviewed:   EKG:  09/06/18 SR rate 66 LBBB    Recent Labs: 05/13/2018: ALT 25; BUN 23; Creat 1.12; Hemoglobin 16.8; Platelets 283; Potassium 5.1; Sodium 139   Lipid Panel    Component Value Date/Time   CHOL 158 05/13/2018 0811   TRIG 98 05/13/2018 0811   HDL 45 05/13/2018 0811   CHOLHDL 3.5 05/13/2018 0811   VLDL 19 02/02/2017 0839   LDLCALC 94 05/13/2018 0811    Additional studies/ records that were reviewed today include:   Echocardiogram: 08/2017  Study Conclusions  - Left ventricle: The cavity size was normal. Wall thickness was   increased in a pattern of moderate LVH. Systolic function was   normal. The estimated ejection fraction was in the range of 55%   to 60%. Abnormal diastolic function, indeterminant grade. Wall   motion was normal; there were no regional wall motion   abnormalities. - Mitral valve: There was mild regurgitation. - Aortic valve: Moderately to severely calcified annulus. Severely   thickened leaflets. Morphologically the valve appears to have   moderate to severe AS. The CW of the AV is mildly off axis, I   suspect the true mean gradient is in the moderate range. There   was mild to moderate stenosis by quantitative data Mean gradient   (S): 17 mm Hg. VTI ratio of LVOT to aortic valve: 0.35. Valve   area (VTI): 1.33 cm^2. Valve area (Vmax): 1.3 cm^2. Valve area   (Vmean): 1.31 cm^2. - Left atrium: The atrium was mildly dilated. - Technically adequate study.  Assessment:    No diagnosis found.   Plan:   In order of problems listed above:  1. CAD - s/p CABG in 12/2016 with LIMA-LAD, reverse SVG-OM, and reverse SVG-PDA.  No angina continue 81 mg ASA , beta blocker and satin    2. Aortic Stenosis -  Reviewed ech done 09/11/17 mean gradient 17 mmHg stable moderate AS Have ordered updated TTE   3. HTN - Well controlled.  Will call if BP goes back up on once daily lisinopril and and will change to Cozaar 100 mg daily   4. HLD - continue statin labs with primary target LDL less than  70      Medication Adjustments/Labs and Tests Ordered: Current medicines are reviewed at length with the patient today.  Concerns regarding medicines are outlined above.  Medication changes, Labs and Tests ordered today are listed in the Patient Instructions below. Patient Instructions  Medication Instructions:  Your physician has recommended you make the following change in your medication:  Decrease Aspirin to 81 mg Daily    Labwork: NONE   Testing/Procedures: TTE for AS      Signed, Charlton HawsPeter Stony Stegmann, MD  09/02/2018 8:17 AM     [facility] [address] [place], [place] [address] Phone: [phone]

## 2018-09-02 NOTE — Progress Notes (Signed)
Subjective:    Patient ID: Samuel Cook, male    DOB: November 10, 1955, 63 y.o.   MRN: [id number]  HPI Patient is a very pleasant 63 year old Caucasian male with a history of coronary artery disease status post CABG in 2018.  He also has moderate to severe aortic stenosis by echocardiogram in January 2019.  He has a follow-up appointment scheduled with his cardiologist later this month.  He does report some mild dyspnea on exertion with strenuous activity however he denies any chest pain.  He denies any syncope or presyncope.  He denies any angina.  His blood pressure today is well controlled at 124/72.  He denies any myalgias or right upper quadrant pain on his Crestor.  He does have a history of prediabetes.  His last hemoglobin A1c was 6.1 in September.  He denies any polyuria polydipsia or blurry vision.  He is due for a flu shot today.  He is due for colon cancer screening.  He is due for prostate cancer screening.  He is due for hepatitis C screening.  We spent approximately 20 minutes discussing each of the screening test.  He declines the flu shot. Past Medical History:  Diagnosis Date  . Anxiety   . Aortic stenosis   . Coronary artery disease    a. s/p CABG in 12/2016 with LIMA-LAD, reverse SVG-OM, and reverse SVG-PDA  . GERD (gastroesophageal reflux disease)    occasionally  . Hyperlipidemia   . Hypertension    Past Surgical History:  Procedure Laterality Date  . CARDIAC CATHETERIZATION    . CORONARY ARTERY BYPASS GRAFT N/A 12/26/2016   Procedure: CORONARY ARTERY BYPASS GRAFTING (CABG) x 3 WITH ENDOSCOPIC HARVESTING OF RIGHT SAPHENOUS VEIN;  Surgeon: Delight OvensGerhardt, Edward B, MD;  [facility];  Service: Open Heart Surgery;  Laterality: N/A;  . LEFT HEART CATH AND CORONARY ANGIOGRAPHY N/A 12/12/2016   Procedure: Left Heart Cath and Coronary Angiography;  Surgeon: Lyn RecordsHenry W Smith, MD;  [facility];  Service: Cardiovascular;  Laterality: N/A;  . none     none  . TEE WITHOUT  CARDIOVERSION N/A 12/26/2016   Procedure: TRANSESOPHAGEAL ECHOCARDIOGRAM (TEE);  Surgeon: Delight OvensGerhardt, Edward B, MD;  [facility];  Service: Open Heart Surgery;  Laterality: N/A;   Current Outpatient Medications on File Prior to Visit  Medication Sig Dispense Refill  . acetaminophen (TYLENOL) 500 MG tablet Take 1,000 mg by mouth 2 (two) times daily.    Marland Kitchen. aspirin EC 81 MG tablet Take 1 tablet (81 mg total) by mouth daily. 90 tablet 3  . BYSTOLIC 20 MG TABS TAKE (1) TABLET BY MOUTH ONCE DAILY. 30 tablet 6  . chlorthalidone (HYGROTON) 25 MG tablet Take 1 tablet (25 mg total) by mouth daily. 90 tablet 3  . lisinopril (PRINIVIL,ZESTRIL) 20 MG tablet Take 1 tablet (20 mg total) by mouth 2 (two) times daily. 180 tablet 3  . LORazepam (ATIVAN) 0.5 MG tablet TAKE 1 TABLET BY MOUTH TWICE DAILY AS NEEDED FOR ANXIETY. 60 tablet 0  . LORazepam (ATIVAN) 0.5 MG tablet TAKE 1 TABLET BY MOUTH TWICE DAILY AS NEEDED FOR ANXIETY. 60 tablet 0  . rosuvastatin (CRESTOR) 20 MG tablet TAKE (1) TABLET BY MOUTH ONCE DAILY. 30 tablet 0   No current facility-administered medications on file prior to visit.    Allergies  Allergen Reactions  . No Known Allergies   . Norvasc [Amlodipine Besylate] Other (See Comments)    Headache   Social History   Socioeconomic  History  . Marital status: Married    Spouse name: Not on file  . Number of children: Not on file  . Years of education: Not on file  . Highest education level: Not on file  Occupational History  . Not on file  Social Needs  . Financial resource strain: Not on file  . Food insecurity:    Worry: Not on file    Inability: Not on file  . Transportation needs:    Medical: Not on file    Non-medical: Not on file  Tobacco Use  . Smoking status: Former Smoker    Packs/day: 1.00    Years: 20.00    Pack years: 20.00    Types: Cigarettes    Last attempt to quit: 10/20/1993    Years since quitting: 24.8  . Smokeless tobacco: Current User    Types: Snuff    Substance and Sexual Activity  . Alcohol use: Yes    Alcohol/week: 1.0 standard drinks    Types: 1 Cans of beer per week    Comment: rare   . Drug use: Yes    Types: Marijuana, Codeine    Comment: occasionally....takes 1 puff when really stressed  . Sexual activity: Yes  Lifestyle  . Physical activity:    Days per week: Not on file    Minutes per session: Not on file  . Stress: Not on file  Relationships  . Social connections:    Talks on phone: Not on file    Gets together: Not on file    Attends religious service: Not on file    Active member of club or organization: Not on file    Attends meetings of clubs or organizations: Not on file    Relationship status: Not on file  . Intimate partner violence:    Fear of current or ex partner: Not on file    Emotionally abused: Not on file    Physically abused: Not on file    Forced sexual activity: Not on file  Other Topics Concern  . Not on file  Social History Narrative  . Not on file     Review of Systems  All other systems reviewed and are negative.      Objective:   Physical Exam Vitals signs reviewed.  Constitutional:      Appearance: Normal appearance.  Cardiovascular:     Rate and Rhythm: Normal rate and regular rhythm.     Pulses: Normal pulses.     Heart sounds: Murmur present. No friction rub. No gallop.   Pulmonary:     Effort: Pulmonary effort is normal. No respiratory distress.     Breath sounds: Normal breath sounds. No stridor. No wheezing, rhonchi or rales.  Chest:     Chest wall: No tenderness.  Abdominal:     General: Bowel sounds are normal. There is no distension.     Palpations: Abdomen is soft.     Tenderness: There is no abdominal tenderness. There is no guarding or rebound.  Musculoskeletal:     Right lower leg: No edema.     Left lower leg: No edema.  Neurological:     Mental Status: He is alert.           Assessment & Plan:  Essential hypertension - Plan: CBC with  Differential/Platelet, COMPLETE METABOLIC PANEL WITH GFR, Lipid panel  Hyperlipidemia, unspecified hyperlipidemia type - Plan: CBC with Differential/Platelet, COMPLETE METABOLIC PANEL WITH GFR, Lipid panel  S/P CABG x 3 - Plan:  CBC with Differential/Platelet, COMPLETE METABOLIC PANEL WITH GFR, Lipid panel  Aortic valve stenosis, etiology of cardiac valve disease unspecified  Anxiety disorder, unspecified type  Prostate cancer screening - Plan: PSA  Patient has a 3-6 systolic ejection murmur heard best over the aortic valve.  Otherwise he is asymptomatic.  I encouraged annual follow-up with his cardiologist to monitor and echocardiogram but at the present time he is asymptomatic.  His blood pressures well controlled at 124/72.  I will check a fasting lipid panel.  His goal LDL cholesterol is less than 70 given his history of coronary artery disease.  I will screen the patient for prostate cancer with PSA.  I will also screen for hepatitis C.  He declines a flu shot.  He also declines colonoscopy and Cologuard at the present time.  Spent approximately 25 minutes today with the patient going over his preventative care.  He also has a history of prediabetes and I will screen with a hemoglobin A1c.

## 2018-09-03 ENCOUNTER — Other Ambulatory Visit: Payer: Self-pay | Admitting: Family Medicine

## 2018-09-03 LAB — CBC WITH DIFFERENTIAL/PLATELET
Absolute Monocytes: 911 cells/uL (ref 200–950)
BASOS ABS: 78 {cells}/uL (ref 0–200)
BASOS PCT: 0.8 %
EOS ABS: 167 {cells}/uL (ref 15–500)
EOS PCT: 1.7 %
HEMATOCRIT: 50 % (ref 38.5–50.0)
HEMOGLOBIN: 17.4 g/dL — AB (ref 13.2–17.1)
LYMPHS ABS: 2156 {cells}/uL (ref 850–3900)
MCH: 34.6 pg — ABNORMAL HIGH (ref 27.0–33.0)
MCHC: 34.8 g/dL (ref 32.0–36.0)
MCV: 99.4 fL (ref 80.0–100.0)
MPV: 9.6 fL (ref 7.5–12.5)
Monocytes Relative: 9.3 %
NEUTROS ABS: 6488 {cells}/uL (ref 1500–7800)
NEUTROS PCT: 66.2 %
Platelets: 263 10*3/uL (ref 140–400)
RBC: 5.03 10*6/uL (ref 4.20–5.80)
RDW: 11.6 % (ref 11.0–15.0)
Total Lymphocyte: 22 %
WBC: 9.8 10*3/uL (ref 3.8–10.8)

## 2018-09-03 LAB — COMPLETE METABOLIC PANEL WITH GFR
AG Ratio: 1.6 (calc) (ref 1.0–2.5)
ALBUMIN MSPROF: 4.7 g/dL (ref 3.6–5.1)
ALT: 27 U/L (ref 9–46)
AST: 26 U/L (ref 10–35)
Alkaline phosphatase (APISO): 45 U/L (ref 40–115)
BUN: 24 mg/dL (ref 7–25)
CALCIUM: 10.2 mg/dL (ref 8.6–10.3)
CO2: 26 mmol/L (ref 20–32)
CREATININE: 1.14 mg/dL (ref 0.70–1.25)
Chloride: 100 mmol/L (ref 98–110)
GFR, EST AFRICAN AMERICAN: 79 mL/min/{1.73_m2} (ref >=60)
GFR, EST NON AFRICAN AMERICAN: 69 mL/min/{1.73_m2} (ref >=60)
Globulin: 3 g/dL (calc) (ref 1.9–3.7)
Glucose, Bld: 103 mg/dL — ABNORMAL HIGH (ref 65–99)
Potassium: 4.5 mmol/L (ref 3.5–5.3)
Sodium: 140 mmol/L (ref 135–146)
TOTAL PROTEIN: 7.7 g/dL (ref 6.1–8.1)
Total Bilirubin: 0.9 mg/dL (ref 0.2–1.2)

## 2018-09-03 LAB — LIPID PANEL
CHOL/HDL RATIO: 3.9 (calc) (ref <5.0)
CHOLESTEROL: 160 mg/dL (ref <200)
HDL: 41 mg/dL (ref >40)
LDL CHOLESTEROL (CALC): 100 mg/dL — AB
NON-HDL CHOLESTEROL (CALC): 119 mg/dL (ref <130)
Triglycerides: 101 mg/dL (ref <150)

## 2018-09-03 LAB — HEMOGLOBIN A1C
Hgb A1c MFr Bld: 6 % of total Hgb — ABNORMAL HIGH (ref <5.7)
Mean Plasma Glucose: 126 (calc)
eAG (mmol/L): 7 (calc)

## 2018-09-03 LAB — HEPATITIS C ANTIBODY
Hepatitis C Ab: NONREACTIVE
SIGNAL TO CUT-OFF: 0.02 (ref <1.00)

## 2018-09-03 LAB — PSA: PSA: 0.8 ng/mL (ref <=4.0)

## 2018-09-06 ENCOUNTER — Encounter: Payer: Self-pay | Admitting: Cardiovascular Disease

## 2018-09-06 ENCOUNTER — Ambulatory Visit: Payer: 59 | Admitting: Cardiovascular Disease

## 2018-09-06 ENCOUNTER — Telehealth: Payer: Self-pay | Admitting: Cardiovascular Disease

## 2018-09-06 ENCOUNTER — Other Ambulatory Visit: Payer: Self-pay | Admitting: Family Medicine

## 2018-09-06 VITALS — BP 136/78 | HR 60 | Ht 70.0 in | Wt 185.0 lb

## 2018-09-06 DIAGNOSIS — I10 Essential (primary) hypertension: Secondary | ICD-10-CM | POA: Diagnosis not present

## 2018-09-06 DIAGNOSIS — I350 Nonrheumatic aortic (valve) stenosis: Secondary | ICD-10-CM | POA: Diagnosis not present

## 2018-09-06 MED ORDER — ROSUVASTATIN CALCIUM 40 MG PO TABS: 40.0000 mg | ORAL_TABLET | Freq: Every day | ORAL | 3 refills | Status: DC

## 2018-09-06 NOTE — Telephone Encounter (Signed)
°  Precert needed for: echo   Location: [facility]    Date:  Jan 24 ,2020

## 2018-09-06 NOTE — Patient Instructions (Signed)
Medication Instructions:  Your physician recommends that you continue on your current medications as directed. Please refer to the Current Medication list given to you today.  If you need a refill on your cardiac medications before your next appointment, please call your pharmacy.   Lab work: NONE   If you have labs (blood work) drawn today and your tests are completely normal, you will receive your results only by: . MyChart Message (if you have MyChart) OR . A paper copy in the mail If you have any lab test that is abnormal or we need to change your treatment, we will call you to review the results.  Testing/Procedures: Your physician has requested that you have an echocardiogram. Echocardiography is a painless test that uses sound waves to create images of your heart. It provides your doctor with information about the size and shape of your heart and how well your heart's chambers and valves are working. This procedure takes approximately one hour. There are no restrictions for this procedure.    Follow-Up: At [facility], you and your health needs are our priority.  As part of our continuing mission to provide you with exceptional heart care, we have created designated Provider Care Teams.  These Care Teams include your primary Cardiologist (physician) and Advanced Practice Providers (APPs -  Physician Assistants and Nurse Practitioners) who all work together to provide you with the care you need, when you need it. You will need a follow up appointment in 1 years.  Please call our office 2 months in advance to schedule this appointment.  You may see Peter Nishan, MD or one of the following Advanced Practice Providers on your designated Care Team:   Brittany Strader, PA-C ([place] Office) . Michele Lenze, PA-C ([place] Office)  Any Other Special Instructions Will Be Listed Below (If Applicable). Thank you for choosing [facility]!     

## 2018-09-10 ENCOUNTER — Ambulatory Visit (HOSPITAL_COMMUNITY)
Admission: RE | Admit: 2018-09-10 | Discharge: 2018-09-10 | Disposition: A | Discharge status: Home / Self Care | Payer: 59 | Source: Ambulatory Visit | Attending: Cardiovascular Disease | Admitting: Cardiovascular Disease

## 2018-09-10 DIAGNOSIS — I350 Nonrheumatic aortic (valve) stenosis: Secondary | ICD-10-CM | POA: Diagnosis not present

## 2018-09-10 NOTE — Progress Notes (Signed)
*  PRELIMINARY RESULTS* Echocardiogram 2D Echocardiogram has been performed.  Stacey DrainWhite, Venna Berberich J 09/10/2018, 9:27 AM

## 2018-09-18 ENCOUNTER — Other Ambulatory Visit: Payer: Self-pay | Admitting: Student

## 2018-10-30 ENCOUNTER — Other Ambulatory Visit: Payer: Self-pay | Admitting: Family Medicine

## 2018-11-01 NOTE — Telephone Encounter (Signed)
Ok to refill??  Last office visit 09/02/2018.  Last refill 08/31/2018.

## 2018-11-11 ENCOUNTER — Other Ambulatory Visit: Payer: Self-pay | Admitting: Family Medicine

## 2018-11-12 ENCOUNTER — Other Ambulatory Visit: Payer: Self-pay | Admitting: Family Medicine

## 2018-11-12 DIAGNOSIS — I10 Essential (primary) hypertension: Secondary | ICD-10-CM

## 2018-11-12 MED ORDER — CHLORTHALIDONE 25 MG PO TABS: 25.0000 mg | ORAL_TABLET | Freq: Every day | ORAL | 3 refills | Status: DC

## 2018-12-06 ENCOUNTER — Other Ambulatory Visit: Payer: Self-pay

## 2018-12-06 ENCOUNTER — Other Ambulatory Visit: Payer: 59

## 2018-12-06 DIAGNOSIS — I10 Essential (primary) hypertension: Secondary | ICD-10-CM

## 2018-12-06 DIAGNOSIS — E785 Hyperlipidemia, unspecified: Secondary | ICD-10-CM

## 2018-12-06 DIAGNOSIS — R7303 Prediabetes: Secondary | ICD-10-CM

## 2018-12-07 LAB — LIPID PANEL
Cholesterol: 157 mg/dL (ref <200)
HDL: 48 mg/dL (ref >=40)
LDL Cholesterol (Calc): 88 mg/dL (calc)
Non-HDL Cholesterol (Calc): 109 mg/dL (calc) (ref <130)
Total CHOL/HDL Ratio: 3.3 (calc) (ref <5.0)
Triglycerides: 113 mg/dL (ref <150)

## 2018-12-07 LAB — CBC WITH DIFFERENTIAL/PLATELET
Absolute Monocytes: 782 cells/uL (ref 200–950)
Basophils Absolute: 77 cells/uL (ref 0–200)
Basophils Relative: 0.9 %
Eosinophils Absolute: 281 cells/uL (ref 15–500)
Eosinophils Relative: 3.3 %
HCT: 48.7 % (ref 38.5–50.0)
Hemoglobin: 16.7 g/dL (ref 13.2–17.1)
Lymphs Abs: 1828 cells/uL (ref 850–3900)
MCH: 34.2 pg — ABNORMAL HIGH (ref 27.0–33.0)
MCHC: 34.3 g/dL (ref 32.0–36.0)
MCV: 99.6 fL (ref 80.0–100.0)
MPV: 9.6 fL (ref 7.5–12.5)
Monocytes Relative: 9.2 %
Neutro Abs: 5534 cells/uL (ref 1500–7800)
Neutrophils Relative %: 65.1 %
Platelets: 274 10*3/uL (ref 140–400)
RBC: 4.89 10*6/uL (ref 4.20–5.80)
RDW: 11.4 % (ref 11.0–15.0)
Total Lymphocyte: 21.5 %
WBC: 8.5 10*3/uL (ref 3.8–10.8)

## 2018-12-07 LAB — COMPREHENSIVE METABOLIC PANEL
AG Ratio: 1.7 (calc) (ref 1.0–2.5)
ALT: 29 U/L (ref 9–46)
AST: 26 U/L (ref 10–35)
Albumin: 4.5 g/dL (ref 3.6–5.1)
Alkaline phosphatase (APISO): 45 U/L (ref 35–144)
BUN: 24 mg/dL (ref 7–25)
CO2: 30 mmol/L (ref 20–32)
Calcium: 9.9 mg/dL (ref 8.6–10.3)
Chloride: 100 mmol/L (ref 98–110)
Creat: 1.08 mg/dL (ref 0.70–1.25)
Globulin: 2.6 g/dL (calc) (ref 1.9–3.7)
Glucose, Bld: 106 mg/dL — ABNORMAL HIGH (ref 65–99)
Potassium: 4.9 mmol/L (ref 3.5–5.3)
Sodium: 139 mmol/L (ref 135–146)
Total Bilirubin: 0.9 mg/dL (ref 0.2–1.2)
Total Protein: 7.1 g/dL (ref 6.1–8.1)

## 2018-12-07 LAB — HEMOGLOBIN A1C
Hgb A1c MFr Bld: 6 % of total Hgb — ABNORMAL HIGH (ref <5.7)
Mean Plasma Glucose: 126 (calc)
eAG (mmol/L): 7 (calc)

## 2019-01-04 ENCOUNTER — Other Ambulatory Visit: Payer: Self-pay | Admitting: Family Medicine

## 2019-01-04 NOTE — Telephone Encounter (Signed)
Ok to refill??  Last office visit 09/02/2018.  Last refill 11/01/2018.

## 2019-03-02 ENCOUNTER — Other Ambulatory Visit: Payer: Self-pay | Admitting: Family Medicine

## 2019-03-02 NOTE — Telephone Encounter (Signed)
Ok to refill??  Last office visit 09/02/2018.  Last refill 01/04/2019.

## 2019-03-07 ENCOUNTER — Other Ambulatory Visit: Payer: 59

## 2019-03-07 DIAGNOSIS — R7303 Prediabetes: Secondary | ICD-10-CM

## 2019-03-07 DIAGNOSIS — I10 Essential (primary) hypertension: Secondary | ICD-10-CM

## 2019-03-07 DIAGNOSIS — E785 Hyperlipidemia, unspecified: Secondary | ICD-10-CM

## 2019-03-08 ENCOUNTER — Ambulatory Visit (INDEPENDENT_AMBULATORY_CARE_PROVIDER_SITE_OTHER): Payer: 59 | Admitting: Family Medicine

## 2019-03-08 ENCOUNTER — Encounter: Payer: Self-pay | Admitting: Family Medicine

## 2019-03-08 ENCOUNTER — Other Ambulatory Visit: Payer: Self-pay

## 2019-03-08 VITALS — BP 112/68 | HR 68 | Temp 98.9°F | Resp 16 | Ht 70.0 in | Wt 195.0 lb

## 2019-03-08 DIAGNOSIS — Z0001 Encounter for general adult medical examination with abnormal findings: Secondary | ICD-10-CM | POA: Diagnosis not present

## 2019-03-08 DIAGNOSIS — I350 Nonrheumatic aortic (valve) stenosis: Secondary | ICD-10-CM | POA: Diagnosis not present

## 2019-03-08 DIAGNOSIS — F419 Anxiety disorder, unspecified: Secondary | ICD-10-CM | POA: Diagnosis not present

## 2019-03-08 DIAGNOSIS — R7303 Prediabetes: Secondary | ICD-10-CM

## 2019-03-08 DIAGNOSIS — I10 Essential (primary) hypertension: Secondary | ICD-10-CM

## 2019-03-08 DIAGNOSIS — Z951 Presence of aortocoronary bypass graft: Secondary | ICD-10-CM

## 2019-03-08 DIAGNOSIS — Z0000 Encounter for general adult medical examination without abnormal findings: Secondary | ICD-10-CM

## 2019-03-08 LAB — COMPREHENSIVE METABOLIC PANEL
AG Ratio: 1.6 (calc) (ref 1.0–2.5)
ALT: 32 U/L (ref 9–46)
AST: 25 U/L (ref 10–35)
Albumin: 4.5 g/dL (ref 3.6–5.1)
Alkaline phosphatase (APISO): 40 U/L (ref 35–144)
BUN: 22 mg/dL (ref 7–25)
CO2: 30 mmol/L (ref 20–32)
Calcium: 10 mg/dL (ref 8.6–10.3)
Chloride: 102 mmol/L (ref 98–110)
Creat: 1.22 mg/dL (ref 0.70–1.25)
Globulin: 2.9 g/dL (calc) (ref 1.9–3.7)
Glucose, Bld: 112 mg/dL — ABNORMAL HIGH (ref 65–99)
Potassium: 5.1 mmol/L (ref 3.5–5.3)
Sodium: 139 mmol/L (ref 135–146)
Total Bilirubin: 0.9 mg/dL (ref 0.2–1.2)
Total Protein: 7.4 g/dL (ref 6.1–8.1)

## 2019-03-08 LAB — CBC WITH DIFFERENTIAL/PLATELET
Absolute Monocytes: 929 cells/uL (ref 200–950)
Basophils Absolute: 83 cells/uL (ref 0–200)
Basophils Relative: 0.9 %
Eosinophils Absolute: 230 cells/uL (ref 15–500)
Eosinophils Relative: 2.5 %
HCT: 52.2 % — ABNORMAL HIGH (ref 38.5–50.0)
Hemoglobin: 17.8 g/dL — ABNORMAL HIGH (ref 13.2–17.1)
Lymphs Abs: 2144 cells/uL (ref 850–3900)
MCH: 34 pg — ABNORMAL HIGH (ref 27.0–33.0)
MCHC: 34.1 g/dL (ref 32.0–36.0)
MCV: 99.8 fL (ref 80.0–100.0)
MPV: 9.2 fL (ref 7.5–12.5)
Monocytes Relative: 10.1 %
Neutro Abs: 5814 cells/uL (ref 1500–7800)
Neutrophils Relative %: 63.2 %
Platelets: 250 10*3/uL (ref 140–400)
RBC: 5.23 10*6/uL (ref 4.20–5.80)
RDW: 11.7 % (ref 11.0–15.0)
Total Lymphocyte: 23.3 %
WBC: 9.2 10*3/uL (ref 3.8–10.8)

## 2019-03-08 LAB — HEMOGLOBIN A1C
Hgb A1c MFr Bld: 6.1 % of total Hgb — ABNORMAL HIGH (ref <5.7)
Mean Plasma Glucose: 128 (calc)
eAG (mmol/L): 7.1 (calc)

## 2019-03-08 LAB — LIPID PANEL
Cholesterol: 166 mg/dL (ref <200)
HDL: 45 mg/dL (ref >=40)
LDL Cholesterol (Calc): 99 mg/dL (calc)
Non-HDL Cholesterol (Calc): 121 mg/dL (calc) (ref <130)
Total CHOL/HDL Ratio: 3.7 (calc) (ref <5.0)
Triglycerides: 121 mg/dL (ref <150)

## 2019-03-08 NOTE — Progress Notes (Signed)
Subjective:    Patient ID: Samuel Cook, male    DOB: 11-24-1955, 63 y.o.   MRN: [id number]  HPI  Patient is here today for complete physical exam.  He has never had a colonoscopy.  He declines a colonoscopy.  We discussed Cologuard testing but he declines this at the present time as well.  He is due for a PSA but he declines this also.  He has a history of polycythemia that was worked up at the cancer center.  His most recent lab work did show a hemoglobin of 17.8.  He denies any smoking currently.  He has no history of sleep apnea.  He has no potential reversible cause for secondary polycythemia.  However he is asymptomatic.  Otherwise he is doing well with no concerns.  His immunization records are listed below: Immunization History  Administered Date(s) Administered  . Pneumococcal Polysaccharide-23 02/04/2017  . Tdap 02/04/2017   Past Surgical History:  Procedure Laterality Date  . CARDIAC CATHETERIZATION    . CORONARY ARTERY BYPASS GRAFT N/A 12/26/2016   Procedure: CORONARY ARTERY BYPASS GRAFTING (CABG) x 3 WITH ENDOSCOPIC HARVESTING OF RIGHT SAPHENOUS VEIN;  Surgeon: Delight OvensGerhardt, Edward B, MD;  [facility];  Service: Open Heart Surgery;  Laterality: N/A;  . LEFT HEART CATH AND CORONARY ANGIOGRAPHY N/A 12/12/2016   Procedure: Left Heart Cath and Coronary Angiography;  Surgeon: Lyn RecordsHenry W Smith, MD;  [facility];  Service: Cardiovascular;  Laterality: N/A;  . none     none  . TEE WITHOUT CARDIOVERSION N/A 12/26/2016   Procedure: TRANSESOPHAGEAL ECHOCARDIOGRAM (TEE);  Surgeon: Delight OvensGerhardt, Edward B, MD;  [facility];  Service: Open Heart Surgery;  Laterality: N/A;   Past Medical History:  Diagnosis Date  . Anxiety   . Aortic stenosis   . Coronary artery disease    a. s/p CABG in 12/2016 with LIMA-LAD, reverse SVG-OM, and reverse SVG-PDA  . GERD (gastroesophageal reflux disease)    occasionally  . Hyperlipidemia   . Hypertension    Current Outpatient Medications  on File Prior to Visit  Medication Sig Dispense Refill  . acetaminophen (TYLENOL) 500 MG tablet Take 1,000 mg by mouth 2 (two) times daily.    Marland Kitchen. aspirin EC 81 MG tablet Take 1 tablet (81 mg total) by mouth daily. 90 tablet 3  . BYSTOLIC 20 MG TABS TAKE (1) TABLET BY MOUTH ONCE DAILY. 30 tablet 6  . chlorthalidone (HYGROTON) 25 MG tablet Take 1 tablet (25 mg total) by mouth daily. 90 tablet 3  . lisinopril (PRINIVIL,ZESTRIL) 20 MG tablet TAKE (1) TABLET BY MOUTH TWICE DAILY. 60 tablet 11  . LORazepam (ATIVAN) 0.5 MG tablet TAKE 1 TABLET BY MOUTH TWICE DAILY AS NEEDED FOR ANXIETY. 60 tablet 0  . rosuvastatin (CRESTOR) 40 MG tablet Take 1 tablet (40 mg total) by mouth daily. 90 tablet 3   No current facility-administered medications on file prior to visit.    Allergies  Allergen Reactions  . No Known Allergies   . Norvasc [Amlodipine Besylate] Other (See Comments)    Headache   Social History   Socioeconomic History  . Marital status: Married    Spouse name: Not on file  . Number of children: Not on file  . Years of education: Not on file  . Highest education level: Not on file  Occupational History  . Not on file  Social Needs  . Financial resource strain: Not on file  . Food insecurity  Worry: Not on file    Inability: Not on file  . Transportation needs    Medical: Not on file    Non-medical: Not on file  Tobacco Use  . Smoking status: Former Smoker    Packs/day: 1.00    Years: 20.00    Pack years: 20.00    Types: Cigarettes    Quit date: 10/20/1993    Years since quitting: 25.3  . Smokeless tobacco: Current User    Types: Snuff  Substance and Sexual Activity  . Alcohol use: Yes    Alcohol/week: 1.0 standard drinks    Types: 1 Cans of beer per week    Comment: rare   . Drug use: Yes    Types: Marijuana, Codeine    Comment: occasionally....takes 1 puff when really stressed  . Sexual activity: Yes  Lifestyle  . Physical activity    Days per week: Not on file     Minutes per session: Not on file  . Stress: Not on file  Relationships  . Social Musician on phone: Not on file    Gets together: Not on file    Attends religious service: Not on file    Active member of club or organization: Not on file    Attends meetings of clubs or organizations: Not on file    Relationship status: Not on file  . Intimate partner violence    Fear of current or ex partner: Not on file    Emotionally abused: Not on file    Physically abused: Not on file    Forced sexual activity: Not on file  Other Topics Concern  . Not on file  Social History Narrative  . Not on file   Family History  Problem Relation Age of Onset  . Cancer Mother   . Early death Mother   . Stroke Father   . Birth defects Maternal Aunt   . Hyperlipidemia Maternal Aunt   . Hearing loss Maternal Uncle   . Heart disease Maternal Grandmother      Review of Systems  All other systems reviewed and are negative.      Objective:   Physical Exam Vitals signs reviewed.  Constitutional:      General: He is not in acute distress.    Appearance: Normal appearance. He is normal weight. He is not ill-appearing, toxic-appearing or diaphoretic.  HENT:     Head: Normocephalic and atraumatic.     Right Ear: Tympanic membrane, ear canal and external ear normal. There is no impacted cerumen.     Left Ear: Tympanic membrane, ear canal and external ear normal. There is no impacted cerumen.     Nose: Nose normal. No congestion or rhinorrhea.     Mouth/Throat:     Mouth: Mucous membranes are moist.     Pharynx: No oropharyngeal exudate or posterior oropharyngeal erythema.  Eyes:     General: No scleral icterus.       Right eye: No discharge.        Left eye: No discharge.     Conjunctiva/sclera: Conjunctivae normal.     Pupils: Pupils are equal, round, and reactive to light.  Neck:     Musculoskeletal: Neck supple.  Cardiovascular:     Rate and Rhythm: Normal rate and regular rhythm.      Pulses: Normal pulses.     Heart sounds: Murmur present. No friction rub. No gallop.   Pulmonary:     Effort: Pulmonary effort is  normal. No respiratory distress.     Breath sounds: Normal breath sounds. No stridor. No wheezing, rhonchi or rales.  Chest:     Chest wall: No tenderness.  Abdominal:     General: Bowel sounds are normal. There is no distension.     Palpations: Abdomen is soft. There is no mass.     Tenderness: There is no abdominal tenderness. There is no guarding or rebound.     Hernia: No hernia is present.  Musculoskeletal:     Right lower leg: No edema.     Left lower leg: No edema.  Lymphadenopathy:     Cervical: No cervical adenopathy.  Skin:    Coloration: Skin is not jaundiced or pale.     Findings: No bruising, erythema, lesion or rash.  Neurological:     General: No focal deficit present.     Mental Status: He is alert and oriented to person, place, and time.     Cranial Nerves: No cranial nerve deficit.     Sensory: No sensory deficit.     Motor: No weakness.     Coordination: Coordination normal.     Gait: Gait normal.     Deep Tendon Reflexes: Reflexes normal.  Psychiatric:        Mood and Affect: Mood normal.        Behavior: Behavior normal.        Thought Content: Thought content normal.        Judgment: Judgment normal.    Lab on 03/07/2019  Component Date Value Ref Range Status  . WBC 03/07/2019 9.2  3.8 - 10.8 Thousand/uL Final  . RBC 03/07/2019 5.23  4.20 - 5.80 Million/uL Final  . Hemoglobin 03/07/2019 17.8* 13.2 - 17.1 g/dL Final  . HCT 30/86/578407/20/2020 52.2* 38.5 - 50.0 % Final  . MCV 03/07/2019 99.8  80.0 - 100.0 fL Final  . MCH 03/07/2019 34.0* 27.0 - 33.0 pg Final  . MCHC 03/07/2019 34.1  32.0 - 36.0 g/dL Final  . RDW 69/62/952807/20/2020 11.7  11.0 - 15.0 % Final  . Platelets 03/07/2019 250  140 - 400 Thousand/uL Final  . MPV 03/07/2019 9.2  7.5 - 12.5 fL Final  . Neutro Abs 03/07/2019 5,814  1,500 - 7,800 cells/uL Final  . Lymphs Abs  03/07/2019 2,144  850 - 3,900 cells/uL Final  . Absolute Monocytes 03/07/2019 929  200 - 950 cells/uL Final  . Eosinophils Absolute 03/07/2019 230  15 - 500 cells/uL Final  . Basophils Absolute 03/07/2019 83  0 - 200 cells/uL Final  . Neutrophils Relative % 03/07/2019 63.2  % Final  . Total Lymphocyte 03/07/2019 23.3  % Final  . Monocytes Relative 03/07/2019 10.1  % Final  . Eosinophils Relative 03/07/2019 2.5  % Final  . Basophils Relative 03/07/2019 0.9  % Final  . Glucose, Bld 03/07/2019 112* 65 - 99 mg/dL Final   Comment: .            Fasting reference interval . For someone without known diabetes, a glucose value between 100 and 125 mg/dL is consistent with prediabetes and should be confirmed with a follow-up test. .   . BUN 03/07/2019 22  7 - 25 mg/dL Final  . Creat 41/32/440107/20/2020 1.22  0.70 - 1.25 mg/dL Final   Comment: For patients >63 years of age, the reference limit for Creatinine is approximately 13% higher for people identified as African-American. .   Edwena Felty. BUN/Creatinine Ratio 03/07/2019 NOT APPLICABLE  6 - 22 (calc) Final  .  Sodium 03/07/2019 139  135 - 146 mmol/L Final  . Potassium 03/07/2019 5.1  3.5 - 5.3 mmol/L Final  . Chloride 03/07/2019 102  98 - 110 mmol/L Final  . CO2 03/07/2019 30  20 - 32 mmol/L Final  . Calcium 03/07/2019 10.0  8.6 - 10.3 mg/dL Final  . Total Protein 03/07/2019 7.4  6.1 - 8.1 g/dL Final  . Albumin 03/07/2019 4.5  3.6 - 5.1 g/dL Final  . Globulin 03/07/2019 2.9  1.9 - 3.7 g/dL (calc) Final  . AG Ratio 03/07/2019 1.6  1.0 - 2.5 (calc) Final  . Total Bilirubin 03/07/2019 0.9  0.2 - 1.2 mg/dL Final  . Alkaline phosphatase (APISO) 03/07/2019 40  35 - 144 U/L Final  . AST 03/07/2019 25  10 - 35 U/L Final  . ALT 03/07/2019 32  9 - 46 U/L Final  . Cholesterol 03/07/2019 166  <200 mg/dL Final  . HDL 03/07/2019 45  > OR = 40 mg/dL Final  . Triglycerides 03/07/2019 121  <150 mg/dL Final  . LDL Cholesterol (Calc) 03/07/2019 99  mg/dL (calc) Final    Comment: Reference range: <100 . Desirable range <100 mg/dL for primary prevention;   <70 mg/dL for patients with CHD or diabetic patients  with > or = 2 CHD risk factors. Marland Kitchen LDL-C is now calculated using the Martin-Hopkins  calculation, which is a validated novel method providing  better accuracy than the Friedewald equation in the  estimation of LDL-C.  Cresenciano Genre et al. Annamaria Helling. 4259;563(87): 2061-2068  (http://education.[employer].com/faq/FAQ164)   . Total CHOL/HDL Ratio 03/07/2019 3.7  <5.0 (calc) Final  . Non-HDL Cholesterol (Calc) 03/07/2019 121  <130 mg/dL (calc) Final   Comment: For patients with diabetes plus 1 major ASCVD risk  factor, treating to a non-HDL-C goal of <100 mg/dL  (LDL-C of <70 mg/dL) is considered a therapeutic  option.   . Hgb A1c MFr Bld 03/07/2019 6.1* <5.7 % of total Hgb Final   Comment: For someone without known diabetes, a hemoglobin  A1c value between 5.7% and 6.4% is consistent with prediabetes and should be confirmed with a  follow-up test. . For someone with known diabetes, a value <7% indicates that their diabetes is well controlled. A1c targets should be individualized based on duration of diabetes, age, comorbid conditions, and other considerations. . This assay result is consistent with an increased risk of diabetes. . Currently, no consensus exists regarding use of hemoglobin A1c for diagnosis of diabetes for children. .   . Mean Plasma Glucose 03/07/2019 128  (calc) Final  . eAG (mmol/L) 03/07/2019 7.1  (calc) Final          Assessment & Plan:  1. General medical exam I recommended a colonoscopy which the patient refused.  Also discussed Cologuard which the patient refused.  I have recommended a PSA but he declines this as well.  Would recommend a repeat CBC in 4 months to monitor his polycythemia.  2. S/P CABG x 3 LDL cholesterol is mildly elevated at 99.  Goal LDL cholesterol would be less than 70 however the patient is  on high intensity Crestor 40 mg a day.  He is no longer smoking.  Blood pressure today is well controlled.  3. Aortic valve stenosis, etiology of cardiac valve disease unspecified I would rate his murmur at 3/6.  He is asymptomatic.  Continue to monitor this clinically.  4. Anxiety disorder, unspecified type Continue to use Xanax as needed.  This is currently well managed.  He  takes the Xanax once a day in the morning.  5. Prediabetes Hemoglobin A1c is mildly elevated at 6.1.  Recommended a low carbohydrate diet and regular aerobic exercise  6. Essential hypertension Blood pressures well controlled.  No changes in medication are necessary at this time

## 2019-04-19 ENCOUNTER — Other Ambulatory Visit: Payer: Self-pay | Admitting: Student

## 2019-05-12 ENCOUNTER — Other Ambulatory Visit: Payer: Self-pay | Admitting: Family Medicine

## 2019-05-12 NOTE — Telephone Encounter (Signed)
Last office visit: 03/08/2019 Last refilled: 03/03/2019

## 2019-06-20 ENCOUNTER — Other Ambulatory Visit: Payer: Self-pay | Admitting: Student

## 2019-07-02 ENCOUNTER — Other Ambulatory Visit: Payer: Self-pay | Admitting: Family Medicine

## 2019-07-04 ENCOUNTER — Other Ambulatory Visit: Payer: Self-pay

## 2019-07-04 DIAGNOSIS — E785 Hyperlipidemia, unspecified: Secondary | ICD-10-CM

## 2019-07-04 DIAGNOSIS — R7303 Prediabetes: Secondary | ICD-10-CM | POA: Diagnosis not present

## 2019-07-04 DIAGNOSIS — I10 Essential (primary) hypertension: Secondary | ICD-10-CM | POA: Diagnosis not present

## 2019-07-04 NOTE — Telephone Encounter (Signed)
Ok to refill??  Last office visit 03/08/2019.  Last refill 05/12/2019.

## 2019-07-05 ENCOUNTER — Other Ambulatory Visit: Payer: Self-pay

## 2019-07-05 LAB — CBC WITH DIFFERENTIAL/PLATELET
Absolute Monocytes: 874 cells/uL (ref 200–950)
Basophils Absolute: 76 cells/uL (ref 0–200)
Basophils Relative: 0.8 %
Eosinophils Absolute: 228 cells/uL (ref 15–500)
Eosinophils Relative: 2.4 %
HCT: 50.6 % — ABNORMAL HIGH (ref 38.5–50.0)
Hemoglobin: 17.1 g/dL (ref 13.2–17.1)
Lymphs Abs: 2128 cells/uL (ref 850–3900)
MCH: 33.9 pg — ABNORMAL HIGH (ref 27.0–33.0)
MCHC: 33.8 g/dL (ref 32.0–36.0)
MCV: 100.2 fL — ABNORMAL HIGH (ref 80.0–100.0)
MPV: 9.6 fL (ref 7.5–12.5)
Monocytes Relative: 9.2 %
Neutro Abs: 6194 cells/uL (ref 1500–7800)
Neutrophils Relative %: 65.2 %
Platelets: 273 10*3/uL (ref 140–400)
RBC: 5.05 10*6/uL (ref 4.20–5.80)
RDW: 11 % (ref 11.0–15.0)
Total Lymphocyte: 22.4 %
WBC: 9.5 10*3/uL (ref 3.8–10.8)

## 2019-07-05 LAB — COMPREHENSIVE METABOLIC PANEL
AG Ratio: 1.8 (calc) (ref 1.0–2.5)
ALT: 40 U/L (ref 9–46)
AST: 28 U/L (ref 10–35)
Albumin: 4.5 g/dL (ref 3.6–5.1)
Alkaline phosphatase (APISO): 41 U/L (ref 35–144)
BUN/Creatinine Ratio: 16 (calc) (ref 6–22)
BUN: 21 mg/dL (ref 7–25)
CO2: 30 mmol/L (ref 20–32)
Calcium: 10.3 mg/dL (ref 8.6–10.3)
Chloride: 99 mmol/L (ref 98–110)
Creat: 1.32 mg/dL — ABNORMAL HIGH (ref 0.70–1.25)
Globulin: 2.5 g/dL (calc) (ref 1.9–3.7)
Glucose, Bld: 110 mg/dL — ABNORMAL HIGH (ref 65–99)
Potassium: 4.8 mmol/L (ref 3.5–5.3)
Sodium: 138 mmol/L (ref 135–146)
Total Bilirubin: 0.6 mg/dL (ref 0.2–1.2)
Total Protein: 7 g/dL (ref 6.1–8.1)

## 2019-07-05 LAB — LIPID PANEL
Cholesterol: 155 mg/dL (ref <200)
HDL: 45 mg/dL (ref >=40)
LDL Cholesterol (Calc): 89 mg/dL (calc)
Non-HDL Cholesterol (Calc): 110 mg/dL (calc) (ref <130)
Total CHOL/HDL Ratio: 3.4 (calc) (ref <5.0)
Triglycerides: 116 mg/dL (ref <150)

## 2019-07-05 LAB — HEMOGLOBIN A1C
Hgb A1c MFr Bld: 5.9 % of total Hgb — ABNORMAL HIGH (ref <5.7)
Mean Plasma Glucose: 123 (calc)
eAG (mmol/L): 6.8 (calc)

## 2019-07-05 MED ORDER — BYSTOLIC 20 MG PO TABS: ORAL_TABLET | ORAL | 1 refills | Status: DC

## 2019-08-23 NOTE — Progress Notes (Signed)
Cardiology Office Note    Date:  08/23/2019   ID:  Samuel Cook, Samuel Cook September 04, 1955, MRN [id number]  PCP:  Donita Brooks, MD  Cardiologist: Charlton Haws, MD    Patient location :  Home Physician location Office   Virtual Visit via Video Note   This visit type was conducted due to national recommendations for restrictions regarding the COVID-19 Pandemic (e.g. social distancing) in an effort to limit this patient's exposure and mitigate transmission in our community.  Due to her co-morbid illnesses, this patient is at least at moderate risk for complications without adequate follow up.  This format is felt to be most appropriate for this patient at this time.  All issues noted in this document were discussed and addressed.  A limited physical exam was performed with this format.  Please refer to the patient's chart for her consent to telehealth for [facility].   64 y.o. with history of CAD CABG May 2018  with LIMA-LAD, reverse SVG-OM, and reverse SVG-PDA), aortic stenosis  Moderate with mean gradient 17 mmHg by TTE 08/2017 , HTN, and HLD who presents to the office today for 31-month follow-up.  Compliant with meds BP at home fine No angina, dyspnea palpitations or syncope   Discussed possible role of TAVR if valve progresses to severe AS   Echo 08/2018 with moderate AS mean gradient 19 mmHg peak 38 mmHg  needs to be updated this month   Past Medical History:  Diagnosis Date  . Anxiety   . Aortic stenosis   . Coronary artery disease    a. s/p CABG in 12/2016 with LIMA-LAD, reverse SVG-OM, and reverse SVG-PDA  . GERD (gastroesophageal reflux disease)    occasionally  . Hyperlipidemia   . Hypertension     Past Surgical History:  Procedure Laterality Date  . CARDIAC CATHETERIZATION    . CORONARY ARTERY BYPASS GRAFT N/A 12/26/2016   Procedure: CORONARY ARTERY BYPASS GRAFTING (CABG) x 3 WITH ENDOSCOPIC HARVESTING OF RIGHT SAPHENOUS VEIN;  Surgeon: Delight Ovens, MD;   Location: [facility] OR;  Service: Open Heart Surgery;  Laterality: N/A;  . LEFT HEART CATH AND CORONARY ANGIOGRAPHY N/A 12/12/2016   Procedure: Left Heart Cath and Coronary Angiography;  Surgeon: Lyn Records, MD;  Location: [facility] INVASIVE CV LAB;  Service: Cardiovascular;  Laterality: N/A;  . none     none  . TEE WITHOUT CARDIOVERSION N/A 12/26/2016   Procedure: TRANSESOPHAGEAL ECHOCARDIOGRAM (TEE);  Surgeon: Delight Ovens, MD;  Location: [facility] OR;  Service: Open Heart Surgery;  Laterality: N/A;    Current Medications: Outpatient Medications Prior to Visit  Medication Sig Dispense Refill  . acetaminophen (TYLENOL) 500 MG tablet Take 1,000 mg by mouth 2 (two) times daily.    Marland Kitchen aspirin EC 81 MG tablet Take 1 tablet (81 mg total) by mouth daily. 90 tablet 3  . chlorthalidone (HYGROTON) 25 MG tablet Take 1 tablet (25 mg total) by mouth daily. 90 tablet 3  . lisinopril (PRINIVIL,ZESTRIL) 20 MG tablet TAKE (1) TABLET BY MOUTH TWICE DAILY. 60 tablet 11  . LORazepam (ATIVAN) 0.5 MG tablet TAKE 1 TABLET BY MOUTH TWICE DAILY AS NEEDED FOR ANXIETY. 60 tablet 0  . Nebivolol HCl (BYSTOLIC) 20 MG TABS TAKE (1) TABLET BY MOUTH ONCE DAILY. 90 tablet 1  . rosuvastatin (CRESTOR) 40 MG tablet Take 1 tablet (40 mg total) by mouth daily. 90 tablet 3   No facility-administered medications prior to visit.     Allergies:  No known allergies and Norvasc [amlodipine besylate]   Social History   Socioeconomic History  . Marital status: Married    Spouse name: Not on file  . Number of children: Not on file  . Years of education: Not on file  . Highest education level: Not on file  Occupational History  . Not on file  Tobacco Use  . Smoking status: Former Smoker    Packs/day: 1.00    Years: 20.00    Pack years: 20.00    Types: Cigarettes    Quit date: 10/20/1993    Years since quitting: 25.8  . Smokeless tobacco: Current User    Types: Snuff  Substance and Sexual Activity  . Alcohol use: Yes     Alcohol/week: 1.0 standard drinks    Types: 1 Cans of beer per week    Comment: rare   . Drug use: Yes    Types: Marijuana, Codeine    Comment: occasionally....takes 1 puff when really stressed  . Sexual activity: Yes  Other Topics Concern  . Not on file  Social History Narrative  . Not on file   Social Determinants of Health   Financial Resource Strain:   . Difficulty of Paying Living Expenses: Not on file  Food Insecurity:   . Worried About Charity fundraiser in the Last Year: Not on file  . Ran Out of Food in the Last Year: Not on file  Transportation Needs:   . Lack of Transportation (Medical): Not on file  . Lack of Transportation (Non-Medical): Not on file  Physical Activity:   . Days of Exercise per Week: Not on file  . Minutes of Exercise per Session: Not on file  Stress:   . Feeling of Stress : Not on file  Social Connections:   . Frequency of Communication with Friends and Family: Not on file  . Frequency of Social Gatherings with Friends and Family: Not on file  . Attends Religious Services: Not on file  . Active Member of Clubs or Organizations: Not on file  . Attends Archivist Meetings: Not on file  . Marital Status: Not on file     Family History:  The patient's family history includes Birth defects in his maternal aunt; Cancer in his mother; Early death in his mother; Hearing loss in his maternal uncle; Heart disease in his maternal grandmother; Hyperlipidemia in his maternal aunt; Stroke in his father.   Review of Systems:   Please see the history of present illness.     General:  No chills, fever, night sweats or weight changes.  Cardiovascular:  No chest pain, dyspnea on exertion, edema, orthopnea, palpitations, paroxysmal nocturnal dyspnea. Dermatological: No rash, lesions/masses Respiratory: No cough, dyspnea Urologic: No hematuria, dysuria Abdominal:   No nausea, vomiting, diarrhea, bright red blood per rectum, melena, or  hematemesis Neurologic:  No visual changes, wkns, changes in mental status.  He denies any of the above symptoms.   All other systems reviewed and are otherwise negative except as noted above.   Physical Exam:    Telephone no exam    Wt Readings from Last 3 Encounters:  03/08/19 195 lb (88.5 kg)  09/06/18 185 lb (83.9 kg)  09/02/18 181 lb (82.1 kg)     Studies/Labs Reviewed:   EKG:  09/06/18 SR rate 66 LBBB   Recent Labs: 07/04/2019: ALT 40; BUN 21; Creat 1.32; Hemoglobin 17.1; Platelets 273; Potassium 4.8; Sodium 138   Lipid Panel    Component Value  Date/Time   CHOL 155 07/04/2019 0812   TRIG 116 07/04/2019 0812   HDL 45 07/04/2019 0812   CHOLHDL 3.4 07/04/2019 0812   VLDL 19 02/02/2017 0839   LDLCALC 89 07/04/2019 0812    Additional studies/ records that were reviewed today include:   Echocardiogram: 08/2017 Study Conclusions  - Left ventricle: The cavity size was normal. Wall thickness was   increased in a pattern of moderate LVH. Systolic function was   normal. The estimated ejection fraction was in the range of 55%   to 60%. Abnormal diastolic function, indeterminant grade. Wall   motion was normal; there were no regional wall motion   abnormalities. - Mitral valve: There was mild regurgitation. - Aortic valve: Moderately to severely calcified annulus. Severely   thickened leaflets. Morphologically the valve appears to have   moderate to severe AS. The CW of the AV is mildly off axis, I   suspect the true mean gradient is in the moderate range. There   was mild to moderate stenosis by quantitative data Mean gradient   (S): 17 mm Hg. VTI ratio of LVOT to aortic valve: 0.35. Valve   area (VTI): 1.33 cm^2. Valve area (Vmax): 1.3 cm^2. Valve area   (Vmean): 1.31 cm^2. - Left atrium: The atrium was mildly dilated. - Technically adequate study.  Assessment:    CAD/CABG  And AS   Plan:   In order of problems listed above:  1. CAD - s/p CABG in  12/2016 with LIMA-LAD, reverse SVG-OM, and reverse SVG-PDA.  No angina continue 81 mg ASA , beta blocker and statin   2. Aortic Stenosis -  Reviewed ech done 09/10/18 mean gradient 19 peak 35 mmHg DVI 0.39 normal EF   3. HTN - Well controlled.  Will call if BP goes back up on once daily lisinopril and and will change to Cozaar 100 mg daily   4. HLD - continue statin labs with primary target LDL less than 70    Time spent reviewing chart, writing note and direct conversation with patient 20 minutes   Medication Adjustments/Labs and Tests Ordered: Current medicines are reviewed at length with the patient today.  Concerns regarding medicines are outlined above.  Medication changes, Labs and Tests ordered today are listed in the Patient Instructions below. Patient Instructions  Medication Instructions:  Your physician has recommended you make the following change in your medication:  Decrease Aspirin to 81 mg Daily    Labwork: NONE   Testing/Procedures: TTE for AS       Signed, Charlton Haws, MD  08/23/2019 8:57 AM    [facility] [address] [place], [place] [address] Phone: [phone]

## 2019-08-25 ENCOUNTER — Telehealth: Payer: Self-pay | Admitting: Cardiovascular Disease

## 2019-08-25 NOTE — Telephone Encounter (Signed)

## 2019-08-26 ENCOUNTER — Encounter: Payer: Self-pay | Admitting: Cardiovascular Disease

## 2019-08-26 ENCOUNTER — Telehealth (INDEPENDENT_AMBULATORY_CARE_PROVIDER_SITE_OTHER): Payer: BC Managed Care – PPO | Admitting: Cardiovascular Disease

## 2019-08-26 ENCOUNTER — Telehealth: Payer: Self-pay | Admitting: Licensed Clinical Social Worker

## 2019-08-26 VITALS — Ht 70.0 in | Wt 195.0 lb

## 2019-08-26 DIAGNOSIS — I350 Nonrheumatic aortic (valve) stenosis: Secondary | ICD-10-CM | POA: Diagnosis not present

## 2019-08-26 NOTE — Patient Instructions (Signed)
Medication Instructions:  Your physician recommends that you continue on your current medications as directed. Please refer to the Current Medication list given to you today.  *If you need a refill on your cardiac medications before your next appointment, please call your pharmacy*  Lab Work: None  If you have labs (blood work) drawn today and your tests are completely normal, you will receive your results only by: Marland Kitchen MyChart Message (if you have MyChart) OR . A paper copy in the mail If you have any lab test that is abnormal or we need to change your treatment, we will call you to review the results.  Testing/Procedures: Your physician has requested that you have an echocardiogram NOW . Echocardiography is a painless test that uses sound waves to create images of your heart. It provides your doctor with information about the size and shape of your heart and how well your heart's chambers and valves are working. This procedure takes approximately one hour. There are no restrictions for this procedure.    Follow-Up: At [facility], you and your health needs are our priority.  As part of our continuing mission to provide you with exceptional heart care, we have created designated Provider Care Teams.  These Care Teams include your primary Cardiologist (physician) and Advanced Practice Providers (APPs -  Physician Assistants and Nurse Practitioners) who all work together to provide you with the care you need, when you need it.  Your next appointment:   6 month(s)  The format for your next appointment:   In Person  Provider:   Charlton Haws, MD  Other Instructions None      Thank you for choosing [facility] !

## 2019-08-26 NOTE — Telephone Encounter (Signed)
CSW referred to assist patient with obtaining a BP cuff. CSW contacted patient to inform cuff will be delivered to home. Patient grateful for support and assistance. CSW available as needed. Jackie Jacier Gladu, LCSW, CCSW-MCS [phone]  

## 2019-08-29 ENCOUNTER — Other Ambulatory Visit: Payer: Self-pay

## 2019-08-29 ENCOUNTER — Ambulatory Visit (HOSPITAL_COMMUNITY)
Admission: RE | Admit: 2019-08-29 | Discharge: 2019-08-29 | Disposition: A | Discharge status: Home / Self Care | Payer: BC Managed Care – PPO | Source: Ambulatory Visit | Attending: Cardiovascular Disease | Admitting: Cardiovascular Disease

## 2019-08-29 DIAGNOSIS — I350 Nonrheumatic aortic (valve) stenosis: Secondary | ICD-10-CM | POA: Diagnosis not present

## 2019-08-29 NOTE — Progress Notes (Signed)
*  PRELIMINARY RESULTS* Echocardiogram 2D Echocardiogram has been performed.  Samuel Cook 08/29/2019, 9:22 AM

## 2019-09-02 ENCOUNTER — Other Ambulatory Visit: Payer: Self-pay | Admitting: Family Medicine

## 2019-09-02 MED ORDER — LORAZEPAM 0.5 MG PO TABS: 0.5000 mg | ORAL_TABLET | Freq: Two times a day (BID) | ORAL | 0 refills | Status: DC | PRN

## 2019-09-06 ENCOUNTER — Other Ambulatory Visit: Payer: Self-pay | Admitting: Family Medicine

## 2019-09-08 MED ORDER — LORAZEPAM 0.5 MG PO TABS: 0.5000 mg | ORAL_TABLET | Freq: Two times a day (BID) | ORAL | 0 refills | Status: DC | PRN

## 2019-09-08 NOTE — Telephone Encounter (Signed)
Please resend Ativan - it printed and did not go to pharm.

## 2019-10-04 ENCOUNTER — Other Ambulatory Visit: Payer: Self-pay | Admitting: Cardiovascular Disease

## 2019-10-05 ENCOUNTER — Other Ambulatory Visit: Payer: Self-pay | Admitting: Family Medicine

## 2019-11-01 ENCOUNTER — Other Ambulatory Visit: Payer: Self-pay | Admitting: Family Medicine

## 2019-11-01 NOTE — Telephone Encounter (Signed)
Ok to refill??  Last office visit 03/08/2019.  Last refill 09/08/2019.

## 2019-12-12 ENCOUNTER — Telehealth: Payer: Self-pay | Admitting: Family Medicine

## 2019-12-12 NOTE — Telephone Encounter (Signed)
Patient is not currently not out of bystolic, however his insurance has changed and is now 200.00 per month, would like to discuss switching meds if possible, wanted to get ahead of the game to get it taken care of before he was running out    [phone]

## 2019-12-12 NOTE — Telephone Encounter (Signed)
Switch to atenolol 50 mg a day and recheck bp in 1 month

## 2019-12-13 MED ORDER — ATENOLOL 50 MG PO TABS: 50.0000 mg | ORAL_TABLET | Freq: Every day | ORAL | 3 refills | Status: DC

## 2019-12-13 NOTE — Telephone Encounter (Signed)
Medication called/sent to requested pharmacy and pt aware via vm 

## 2019-12-28 ENCOUNTER — Other Ambulatory Visit: Payer: Self-pay | Admitting: Family Medicine

## 2020-01-11 ENCOUNTER — Other Ambulatory Visit: Payer: Self-pay

## 2020-01-11 ENCOUNTER — Telehealth: Payer: Self-pay | Admitting: Family Medicine

## 2020-01-11 DIAGNOSIS — I10 Essential (primary) hypertension: Secondary | ICD-10-CM

## 2020-01-11 MED ORDER — LORAZEPAM 0.5 MG PO TABS: 0.5000 mg | ORAL_TABLET | Freq: Two times a day (BID) | ORAL | 0 refills | Status: AC | PRN

## 2020-01-11 MED ORDER — LORAZEPAM 0.5 MG PO TABS: 0.5000 mg | ORAL_TABLET | Freq: Two times a day (BID) | ORAL | 0 refills | Status: DC | PRN

## 2020-01-11 MED ORDER — CHLORTHALIDONE 25 MG PO TABS: 25.0000 mg | ORAL_TABLET | Freq: Every day | ORAL | 0 refills | Status: DC

## 2020-01-11 MED ORDER — ROSUVASTATIN CALCIUM 40 MG PO TABS: ORAL_TABLET | ORAL | 0 refills | Status: DC

## 2020-01-11 NOTE — Telephone Encounter (Signed)
Requested Prescriptions   Pending Prescriptions Disp Refills  . chlorthalidone (HYGROTON) 25 MG tablet 30 tablet 0    Sig: Take 1 tablet (25 mg total) by mouth daily.   Signed Prescriptions Disp Refills  . rosuvastatin (CRESTOR) 40 MG tablet 90 tablet 0    Sig: TAKE (1) TABLET BY MOUTH ONCE DAILY.    Authorizing Provider: Lynnea Ferrier T    Ordering User: Mancel Bale LORazepam (ATIVAN) 0.5 MG tablet 60 tablet 0    Sig: Take 1 tablet (0.5 mg total) by mouth 2 (two) times daily as needed. for anxiety    Authorizing Provider: Lynnea Ferrier T    Ordering User: Mancel Bale chlorthalidone (HYGROTON) 25 MG tablet 30 tablet 0    Sig: Take 1 tablet (25 mg total) by mouth daily.    Authorizing Provider: Donita Brooks    Ordering User: Mancel Bale    Last OV 03/08/2019

## 2020-01-11 NOTE — Telephone Encounter (Signed)
error 

## 2020-01-11 NOTE — Telephone Encounter (Signed)
Requested Prescriptions   Pending Prescriptions Disp Refills  . LORazepam (ATIVAN) 0.5 MG tablet 60 tablet 0    Sig: Take 1 tablet (0.5 mg total) by mouth 2 (two) times daily as needed. for anxiety   Signed Prescriptions Disp Refills  . rosuvastatin (CRESTOR) 40 MG tablet 90 tablet 0    Sig: TAKE (1) TABLET BY MOUTH ONCE DAILY.    Authorizing Provider: Lynnea Ferrier T    Ordering User: Mancel Bale LORazepam (ATIVAN) 0.5 MG tablet 60 tablet 0    Sig: Take 1 tablet (0.5 mg total) by mouth 2 (two) times daily as needed. for anxiety    Authorizing Provider: Lynnea Ferrier T    Ordering User: Mancel Bale    Last OV 03/08/2019   Last written 11/01/2019

## 2020-01-12 MED ORDER — CHLORTHALIDONE 25 MG PO TABS: 25.0000 mg | ORAL_TABLET | Freq: Every day | ORAL | 0 refills | Status: DC

## 2020-01-31 ENCOUNTER — Encounter: Payer: Self-pay | Admitting: Cardiovascular Disease

## 2020-01-31 ENCOUNTER — Other Ambulatory Visit: Payer: Self-pay

## 2020-01-31 ENCOUNTER — Ambulatory Visit (INDEPENDENT_AMBULATORY_CARE_PROVIDER_SITE_OTHER): Payer: 59 | Admitting: Cardiovascular Disease

## 2020-01-31 VITALS — BP 158/78 | HR 66 | Ht 70.0 in | Wt 185.6 lb

## 2020-01-31 DIAGNOSIS — I10 Essential (primary) hypertension: Secondary | ICD-10-CM | POA: Diagnosis not present

## 2020-01-31 DIAGNOSIS — I350 Nonrheumatic aortic (valve) stenosis: Secondary | ICD-10-CM

## 2020-01-31 NOTE — Patient Instructions (Signed)
Medication Instructions:  Your physician recommends that you continue on your current medications as directed. Please refer to the Current Medication list given to you today.  *If you need a refill on your cardiac medications before your next appointment, please call your pharmacy*   Lab Work: None today If you have labs (blood work) drawn today and your tests are completely normal, you will receive your results only by: Marland Kitchen MyChart Message (if you have MyChart) OR . A paper copy in the mail If you have any lab test that is abnormal or we need to change your treatment, we will call you to review the results.   Testing/Procedures: Your physician has requested that you have an echocardiogram in January . Echocardiography is a painless test that uses sound waves to create images of your heart. It provides your doctor with information about the size and shape of your heart and how well your heart's chambers and valves are working. This procedure takes approximately one hour. There are no restrictions for this procedure.     Follow-Up: At [facility], you and your health needs are our priority.  As part of our continuing mission to provide you with exceptional heart care, we have created designated Provider Care Teams.  These Care Teams include your primary Cardiologist (physician) and Advanced Practice Providers (APPs -  Physician Assistants and Nurse Practitioners) who all work together to provide you with the care you need, when you need it.  We recommend signing up for the patient portal called "MyChart".  Sign up information is provided on this After Visit Summary.  MyChart is used to connect with patients for Virtual Visits (Telemedicine).  Patients are able to view lab/test results, encounter notes, upcoming appointments, etc.  Non-urgent messages can be sent to your provider as well.   To learn more about what you can do with MyChart, go to [email or web address].    Your next  appointment:   6 month(s)  January 2022  The format for your next appointment:   In Person  Provider:   Charlton Haws, MD   Other Instructions None        Thank you for choosing [facility] !

## 2020-01-31 NOTE — Progress Notes (Signed)
Cardiology Office Note    Date:  01/31/2020   ID:  Samuel Cook [birth year], MRN [id number]  PCP:  Susy Frizzle, MD  Cardiologist: Jenkins Rouge, MD     64 y.o. with history of CAD CABG May 2018  with LIMA-LAD, reverse SVG-OM, and reverse SVG-PDA), aortic stenosis  Moderate with mean gradient 17 mmHg by TTE 08/2017 , HTN, and HLD who presents to the office today for 62-month follow-up.  Compliant with meds BP at home fine No angina, dyspnea palpitations or syncope   Discussed possible role of TAVR if valve progresses to severe AS   Echo 08/2018 with moderate AS mean gradient 19 mmHg peak 38 mmHg   Echo 08/29/19 reviewed EF 55-60% moderate AS mean gradient 22 peak 37 mmHg DVI 0.43 AVA 1.1 cm2  [place]. Has had multiple broken Bones playing sports and jumping out of planes in the [place] [employer]  No pre syncope unusual dyspnea or anginal pain  Trying to get a metal garage built at his house but has been slow progress with COVID    Past Medical History:  Diagnosis Date  . Anxiety   . Aortic stenosis   . Coronary artery disease    a. s/p CABG in 12/2016 with LIMA-LAD, reverse SVG-OM, and reverse SVG-PDA  . GERD (gastroesophageal reflux disease)    occasionally  . Hyperlipidemia   . Hypertension     Past Surgical History:  Procedure Laterality Date  . CARDIAC CATHETERIZATION    . CORONARY ARTERY BYPASS GRAFT N/A 12/26/2016   Procedure: CORONARY ARTERY BYPASS GRAFTING (CABG) x 3 WITH ENDOSCOPIC HARVESTING OF RIGHT SAPHENOUS VEIN;  Surgeon: Grace Isaac, MD;  Location: [place];  Service: Open Heart Surgery;  Laterality: N/A;  . LEFT HEART CATH AND CORONARY ANGIOGRAPHY N/A 12/12/2016   Procedure: Left Heart Cath and Coronary Angiography;  Surgeon: Belva Crome, MD;  Location: [place] [facility];  Service: Cardiovascular;  Laterality: N/A;  . none     none  . TEE WITHOUT CARDIOVERSION N/A 12/26/2016   Procedure:  TRANSESOPHAGEAL ECHOCARDIOGRAM (TEE);  Surgeon: Grace Isaac, MD;  Location: [place];  Service: Open Heart Surgery;  Laterality: N/A;    Current Medications: Outpatient Medications Prior to Visit  Medication Sig Dispense Refill  . acetaminophen (TYLENOL) 500 MG tablet Take 1,000 mg by mouth 2 (two) times daily.    Marland Kitchen aspirin EC 81 MG tablet Take 1 tablet (81 mg total) by mouth daily. 90 tablet 3  . atenolol (TENORMIN) 50 MG tablet Take 1 tablet (50 mg total) by mouth daily. 90 tablet 3  . chlorthalidone (HYGROTON) 25 MG tablet Take 1 tablet (25 mg total) by mouth daily. 30 tablet 0  . lisinopril (ZESTRIL) 20 MG tablet Take 1 tablet (20 mg total) by mouth daily. 90 tablet 3  . LORazepam (ATIVAN) 0.5 MG tablet Take 1 tablet (0.5 mg total) by mouth 2 (two) times daily as needed. for anxiety 60 tablet 0  . rosuvastatin (CRESTOR) 40 MG tablet TAKE (1) TABLET BY MOUTH ONCE DAILY. 90 tablet 0   No facility-administered medications prior to visit.     Allergies:   No known allergies and Norvasc [amlodipine besylate]   Social History   Socioeconomic History  . Marital status: Married    Spouse name: Not on file  . Number of children: Not on file  . Years of education: Not on file  .  Highest education level: Not on file  Occupational History  . Not on file  Tobacco Use  . Smoking status: Former Smoker    Packs/day: 1.00    Years: 20.00    Pack years: 20.00    Types: Cigarettes    Quit date: 10/20/1993    Years since quitting: 26.2  . Smokeless tobacco: Current User    Types: Snuff  Vaping Use  . Vaping Use: Never used  Substance and Sexual Activity  . Alcohol use: Yes    Alcohol/week: 1.0 standard drink    Types: 1 Cans of beer per week    Comment: rare   . Drug use: Yes    Types: Marijuana, Codeine    Comment: occasionally....takes 1 puff when really stressed  . Sexual activity: Yes  Other Topics Concern  . Not on file  Social History Narrative  . Not on file   Social  Determinants of Health   Financial Resource Strain:   . Difficulty of Paying Living Expenses:   Food Insecurity:   . Worried About Programme researcher, broadcasting/film/video in the Last Year:   . Barista in the Last Year:   Transportation Needs:   . Freight forwarder (Medical):   Marland Kitchen Lack of Transportation (Non-Medical):   Physical Activity:   . Days of Exercise per Week:   . Minutes of Exercise per Session:   Stress:   . Feeling of Stress :   Social Connections:   . Frequency of Communication with Friends and Family:   . Frequency of Social Gatherings with Friends and Family:   . Attends Religious Services:   . Active Member of Clubs or Organizations:   . Attends Banker Meetings:   Marland Kitchen Marital Status:      Family History:  The patient's family history includes Birth defects in his maternal aunt; Cancer in his mother; Early death in his mother; Hearing loss in his maternal uncle; Heart disease in his maternal grandmother; Hyperlipidemia in his maternal aunt; Stroke in his father.   Review of Systems:   Please see the history of present illness.     General:  No chills, fever, night sweats or weight changes.  Cardiovascular:  No chest pain, dyspnea on exertion, edema, orthopnea, palpitations, paroxysmal nocturnal dyspnea. Dermatological: No rash, lesions/masses Respiratory: No cough, dyspnea Urologic: No hematuria, dysuria Abdominal:   No nausea, vomiting, diarrhea, bright red blood per rectum, melena, or hematemesis Neurologic:  No visual changes, wkns, changes in mental status.  He denies any of the above symptoms.   All other systems reviewed and are otherwise negative except as noted above.   Physical Exam:    There were no vitals taken for this visit. Affect appropriate Healthy:  appears stated age HEENT: normal Neck supple with no adenopathy JVP normal no bruits no thyromegaly Lungs clear with no wheezing and good diaphragmatic motion Heart:  S1/S2 AS  murmur,  no rub, gallop or click PMI normal post sternotomy  Abdomen: benighn, BS positve, no tenderness, no AAA no bruit.  No HSM or HJR Distal pulses intact with no bruits No edema Neuro non-focal Skin warm and dry No muscular weakness    Wt Readings from Last 3 Encounters:  08/26/19 195 lb (88.5 kg)  03/08/19 195 lb (88.5 kg)  09/06/18 185 lb (83.9 kg)     Studies/Labs Reviewed:   EKG:  01/31/20 SR rate 66 LBBB   Recent Labs: 07/04/2019: ALT 40; BUN 21; Creat 1.32;  Hemoglobin 17.1; Platelets 273; Potassium 4.8; Sodium 138   Lipid Panel    Component Value Date/Time   CHOL 155 07/04/2019 0812   TRIG 116 07/04/2019 0812   HDL 45 07/04/2019 0812   CHOLHDL 3.4 07/04/2019 0812   VLDL 19 02/02/2017 0839   LDLCALC 89 07/04/2019 0812    Additional studies/ records that were reviewed today include:   Echocardiogram:  08/29/19   IMPRESSIONS    1. Left ventricular ejection fraction, by visual estimation, is 55 to  60%. The left ventricle has normal function. There is no left ventricular  hypertrophy.  2. The left ventricle has no regional wall motion abnormalities.  3. Global right ventricle has normal systolic function.The right  ventricular size is normal. No increase in right ventricular wall  thickness.  4. Left atrial size was mildly dilated.  5. Right atrial size was normal.  6. Mild mitral annular calcification.  7. The mitral valve is grossly normal. Trivial mitral valve  regurgitation.  8. The tricuspid valve is grossly normal.  9. The aortic valve is tricuspid. Aortic valve regurgitation is mild.  Moderate aortic valve stenosis.  10. Aortic valve area, by VTI measures 1.10 cm.  11. Aortic valve mean gradient measures 22.3 mmHg. Dimentionless index  0.43.  12. The pulmonic valve was grossly normal. Pulmonic valve regurgitation is  trivial.  13. The inferior vena cava is normal in size with greater than 50%  respiratory variability, suggesting right  atrial pressure of 3 mmHg.  14. TR signal is inadequate for assessing pulmonary artery systolic  pressure.  15. Left ventricular diastolic parameters are indeterminate.    Assessment:    CAD/CABG  And AS   Plan:   In order of problems listed above:  1. CAD - s/p CABG in 12/2016 with LIMA-LAD, reverse SVG-OM, and reverse SVG-PDA.  No angina continue 81 mg ASA , beta blocker and statin   2. Aortic Stenosis -  Echo 08/29/19 stable moderate AS see HPI  F/u echo ordered January 2022  3. HTN - Well controlled.  Will call if BP goes back up on once daily lisinopril and and will change to Cozaar 100 mg daily   4. HLD - continue statin labs with primary target LDL less than 70    Time spent reviewing chart, writing note and direct conversation with patient 20 minutes   Medication Adjustments/Labs and Tests Ordered: Current medicines are reviewed at length with the patient today.  Concerns regarding medicines are outlined above.  Medication changes, Labs and Tests ordered today are listed in the Patient Instructions below. Patient Instructions  Medication Instructions:  Your physician has recommended you make the following change in your medication:  Decrease Aspirin to 81 mg Daily    Labwork: NONE   Testing/Procedures: TTE for AS  January 2022  F/U office visit at that time    Signed, Charlton Haws, MD  01/31/2020 2:19 PM    [facility] [address] [place], [place] [address] Phone: [phone]

## 2020-02-08 ENCOUNTER — Other Ambulatory Visit: Payer: Self-pay | Admitting: Family Medicine

## 2020-02-08 DIAGNOSIS — I10 Essential (primary) hypertension: Secondary | ICD-10-CM

## 2020-03-05 ENCOUNTER — Other Ambulatory Visit: Payer: Self-pay | Admitting: Family Medicine

## 2020-03-05 DIAGNOSIS — I10 Essential (primary) hypertension: Secondary | ICD-10-CM

## 2020-03-07 ENCOUNTER — Ambulatory Visit: Payer: BC Managed Care – PPO | Admitting: Cardiovascular Disease

## 2020-04-03 ENCOUNTER — Other Ambulatory Visit: Payer: Self-pay | Admitting: Family Medicine

## 2020-05-21 ENCOUNTER — Other Ambulatory Visit: Payer: Self-pay | Admitting: Family Medicine

## 2020-05-21 DIAGNOSIS — I10 Essential (primary) hypertension: Secondary | ICD-10-CM

## 2020-06-20 ENCOUNTER — Other Ambulatory Visit: Payer: Self-pay | Admitting: Family Medicine

## 2020-06-20 DIAGNOSIS — I10 Essential (primary) hypertension: Secondary | ICD-10-CM

## 2020-07-06 ENCOUNTER — Other Ambulatory Visit: Payer: Self-pay | Admitting: Family Medicine

## 2020-07-23 ENCOUNTER — Other Ambulatory Visit: Payer: Self-pay | Admitting: Family Medicine

## 2020-07-23 DIAGNOSIS — I10 Essential (primary) hypertension: Secondary | ICD-10-CM

## 2020-08-20 ENCOUNTER — Other Ambulatory Visit: Payer: Self-pay

## 2020-08-20 ENCOUNTER — Ambulatory Visit (HOSPITAL_COMMUNITY)
Admission: RE | Admit: 2020-08-20 | Discharge: 2020-08-20 | Disposition: A | Discharge status: Home / Self Care | Payer: BC Managed Care – PPO | Source: Ambulatory Visit | Attending: Cardiovascular Disease | Admitting: Cardiovascular Disease

## 2020-08-20 DIAGNOSIS — I350 Nonrheumatic aortic (valve) stenosis: Secondary | ICD-10-CM | POA: Insufficient documentation

## 2020-08-20 LAB — ECHOCARDIOGRAM COMPLETE
AR max vel: 0.96 cm2
AV Area VTI: 1.03 cm2
AV Area mean vel: 1.01 cm2
AV Mean grad: 29 mmHg
AV Peak grad: 50 mmHg
Ao pk vel: 3.54 m/s
Area-P 1/2: 2.93 cm2
P 1/2 time: 341 msec
S' Lateral: 3.28 cm

## 2020-08-20 NOTE — Progress Notes (Signed)
*  PRELIMINARY RESULTS* Echocardiogram 2D Echocardiogram has been performed.  Jeryl Columbia 08/20/2020, 9:56 AM

## 2020-08-21 ENCOUNTER — Telehealth: Payer: Self-pay

## 2020-08-21 NOTE — Telephone Encounter (Signed)
Called patient and let him know the results of his echo per Dr. Eden Emms. Will send to Samule Dry to schedule f/u echo in 6 months

## 2020-08-21 NOTE — Telephone Encounter (Signed)
-----   Message from Ethelda Chick, RN sent at 08/20/2020  4:03 PM EST ----- Will send to [facility] triage

## 2020-08-23 DIAGNOSIS — I350 Nonrheumatic aortic (valve) stenosis: Secondary | ICD-10-CM | POA: Diagnosis not present

## 2020-08-23 DIAGNOSIS — I2510 Atherosclerotic heart disease of native coronary artery without angina pectoris: Secondary | ICD-10-CM | POA: Diagnosis not present

## 2020-08-23 DIAGNOSIS — E1100 Type 2 diabetes mellitus with hyperosmolarity without nonketotic hyperglycemic-hyperosmolar coma (NKHHC): Secondary | ICD-10-CM | POA: Diagnosis not present

## 2020-08-23 DIAGNOSIS — I10 Essential (primary) hypertension: Secondary | ICD-10-CM | POA: Diagnosis not present

## 2020-08-23 DIAGNOSIS — R7309 Other abnormal glucose: Secondary | ICD-10-CM | POA: Diagnosis not present

## 2020-08-27 ENCOUNTER — Other Ambulatory Visit (HOSPITAL_COMMUNITY): Payer: 59

## 2020-09-15 ENCOUNTER — Other Ambulatory Visit: Payer: Self-pay | Admitting: Family Medicine

## 2020-09-17 ENCOUNTER — Other Ambulatory Visit: Payer: Self-pay

## 2020-09-17 DIAGNOSIS — I350 Nonrheumatic aortic (valve) stenosis: Secondary | ICD-10-CM

## 2020-09-17 NOTE — Progress Notes (Signed)
Order for Echocardiogram

## 2020-09-18 ENCOUNTER — Other Ambulatory Visit: Payer: Self-pay | Admitting: Family Medicine

## 2020-09-18 NOTE — Telephone Encounter (Signed)
PATIENT IS NO LONGER WITH [facility]

## 2020-09-21 NOTE — Progress Notes (Signed)
Cardiology Office Note    Date:  10/01/2020   ID:  Samuel Cook 1956-07-08, MRN [id number]  PCP:  Carylon Perches, MD  Cardiologist: Charlton Haws, MD     65 y.o. with history of CAD CABG May 2018  with LIMA-LAD, reverse SVG-OM, and reverse SVG-PDA), aortic stenosis  Moderate with mean gradient 17 mmHg by TTE 08/2017 , HTN, and HLD who presents to the office today for 51-month follow-up.  Compliant with meds BP at home fine No angina, dyspnea palpitations or syncope   Discussed possible role of TAVR if valve progresses to severe AS   Echo 08/2018 with moderate AS mean gradient 19 mmHg peak 38 mmHg   Echo 08/29/19 reviewed EF 55-60% moderate AS mean gradient 22 peak 37 mmHg DVI 0.43 AVA 1.1 cm2 Echo 08/20/20 EF 55-60% mean gradient 29 mmHg peak 50 mmHg DVI 0.36   [place]. Has had multiple broken Bones playing sports and jumping out of planes in the [place] [employer]  No pre syncope unusual dyspnea or anginal pain  Trying to get a metal garage built at his house but has been slow progress with COVID   Has some insurance issues Some sinus congestion    Past Medical History:  Diagnosis Date  . Anxiety   . Aortic stenosis   . Coronary artery disease    a. s/p CABG in 12/2016 with LIMA-LAD, reverse SVG-OM, and reverse SVG-PDA  . GERD (gastroesophageal reflux disease)    occasionally  . Hyperlipidemia   . Hypertension     Past Surgical History:  Procedure Laterality Date  . CARDIAC CATHETERIZATION    . CORONARY ARTERY BYPASS GRAFT N/A 12/26/2016   Procedure: CORONARY ARTERY BYPASS GRAFTING (CABG) x 3 WITH ENDOSCOPIC HARVESTING OF RIGHT SAPHENOUS VEIN;  Surgeon: Delight Ovens, MD;  Location: [facility];  Service: Open Heart Surgery;  Laterality: N/A;  . LEFT HEART CATH AND CORONARY ANGIOGRAPHY N/A 12/12/2016   Procedure: Left Heart Cath and Coronary Angiography;  Surgeon: Lyn Records, MD;  [facility] INVASIVE [facility];  Service:  Cardiovascular;  Laterality: N/A;  . none     none  . TEE WITHOUT CARDIOVERSION N/A 12/26/2016   Procedure: TRANSESOPHAGEAL ECHOCARDIOGRAM (TEE);  Surgeon: Delight Ovens, MD;  Location: [facility] OR;  Service: Open Heart Surgery;  Laterality: N/A;    Current Medications: Outpatient Medications Prior to Visit  Medication Sig Dispense Refill  . acetaminophen (TYLENOL) 500 MG tablet Take 1,000 mg by mouth 2 (two) times daily.    Marland Kitchen aspirin EC 81 MG tablet Take 1 tablet (81 mg total) by mouth daily. 90 tablet 3  . chlorthalidone (HYGROTON) 25 MG tablet TAKE 1 TABLET(25 MG) BY MOUTH DAILY 30 tablet 0  . lisinopril (ZESTRIL) 20 MG tablet TAKE 1 TABLET BY MOUTH EVERY DAY 90 tablet 3  . LORazepam (ATIVAN) 0.5 MG tablet Take 1 tablet (0.5 mg total) by mouth 2 (two) times daily as needed. for anxiety 60 tablet 0  . nebivolol (BYSTOLIC) 10 MG tablet Take 10 mg by mouth daily.    . rosuvastatin (CRESTOR) 40 MG tablet TAKE 1 TABLET BY MOUTH EVERY DAY 90 tablet 0  . atenolol (TENORMIN) 50 MG tablet Take 1 tablet (50 mg total) by mouth daily. 90 tablet 3   No facility-administered medications prior to visit.     Allergies:   Norvasc [amlodipine besylate]   Social History   Socioeconomic History  .  Marital status: Married    Spouse name: Not on file  . Number of children: Not on file  . Years of education: Not on file  . Highest education level: Not on file  Occupational History  . Not on file  Tobacco Use  . Smoking status: Former Smoker    Packs/day: 1.00    Years: 20.00    Pack years: 20.00    Types: Cigarettes    Quit date: 10/20/1993    Years since quitting: 26.9  . Smokeless tobacco: Current User    Types: Snuff  Vaping Use  . Vaping Use: Never used  Substance and Sexual Activity  . Alcohol use: Yes    Alcohol/week: 1.0 standard drink    Types: 1 Cans of beer per week    Comment: rare   . Drug use: Yes    Types: Marijuana, Codeine    Comment: occasionally....takes 1 puff when  really stressed  . Sexual activity: Yes  Other Topics Concern  . Not on file  Social History Narrative  . Not on file   Social Determinants of Health   Financial Resource Strain: Not on file  Food Insecurity: Not on file  Transportation Needs: Not on file  Physical Activity: Not on file  Stress: Not on file  Social Connections: Not on file     Family History:  The patient's family history includes Birth defects in his maternal aunt; Cancer in his mother; Early death in his mother; Hearing loss in his maternal uncle; Heart disease in his maternal grandmother; Hyperlipidemia in his maternal aunt; Stroke in his father.   Review of Systems:   Please see the history of present illness.     General:  No chills, fever, night sweats or weight changes.  Cardiovascular:  No chest pain, dyspnea on exertion, edema, orthopnea, palpitations, paroxysmal nocturnal dyspnea. Dermatological: No rash, lesions/masses Respiratory: No cough, dyspnea Urologic: No hematuria, dysuria Abdominal:   No nausea, vomiting, diarrhea, bright red blood per rectum, melena, or hematemesis Neurologic:  No visual changes, wkns, changes in mental status.  He denies any of the above symptoms.   All other systems reviewed and are otherwise negative except as noted above.   Physical Exam:    BP 130/60   Pulse 70   Ht 5\' 9"  (1.753 m)   Wt 81.2 kg   SpO2 97%   BMI 26.43 kg/m  Affect appropriate Healthy:  appears stated age HEENT: normal Neck supple with no adenopathy JVP normal no bruits no thyromegaly Lungs clear with no wheezing and good diaphragmatic motion Heart:  S1/S2 AS  murmur, no rub, gallop or click PMI normal post sternotomy  Abdomen: benighn, BS positve, no tenderness, no AAA no bruit.  No HSM or HJR Distal pulses intact with no bruits No edema Neuro non-focal Skin warm and dry No muscular weakness    Wt Readings from Last 3 Encounters:  10/01/20 81.2 kg  01/31/20 84.2 kg  08/26/19  88.5 kg     Studies/Labs Reviewed:   EKG:  01/31/20 SR rate 66 LBBB   Recent Labs: No results found for requested labs within last 8760 hours.   Lipid Panel    Component Value Date/Time   CHOL 155 07/04/2019 0812   TRIG 116 07/04/2019 0812   HDL 45 07/04/2019 0812   CHOLHDL 3.4 07/04/2019 0812   VLDL 19 02/02/2017 0839   LDLCALC 89 07/04/2019 0812    Additional studies/ records that were reviewed today include:   Echocardiogram:  08/20/20    IMPRESSIONS    1. Left ventricular ejection fraction, by estimation, is 60 to 65%. The  left ventricle has normal function. The left ventricle has no regional  wall motion abnormalities. There is mild left ventricular hypertrophy.  Left ventricular diastolic parameters  were normal.  2. Right ventricular systolic function is normal. The right ventricular  size is normal.  3. Left atrial size was mildly dilated.  4. The mitral valve is degenerative. Trivial mitral valve regurgitation.  No evidence of mitral stenosis. Moderate mitral annular calcification.  5. Progression of AS now mean gradient 19 mmHg-> 30 mmHg with DVI 0.36  and AVA around 1.1 cm2. The aortic valve is normal in structure. There is  severe calcifcation of the aortic valve. There is severe thickening of the  aortic valve. Aortic valve  regurgitation is mild. Moderate to severe aortic valve stenosis.  6. The inferior vena cava is normal in size with greater than 50%  respiratory variability, suggesting right atrial pressure of 3 mmHg.    Assessment:    CAD/CABG  And AS   Plan:   In order of problems listed above:  1. CAD - s/p CABG in 12/2016 with LIMA-LAD, reverse SVG-OM, and reverse SVG-PDA.  No angina continue 81 mg ASA , beta blocker and statin   2. Aortic Stenosis -  Echo 08/20/20 progression with mean gradient 19-30 mmHg DVI 0.36  F/u echo in 6 months Discussed possibility Of TAVR vs redo open heart surgery for valve Patent LIMA would favor TAVR  but age would favor SAVR   3. HTN - Well controlled.  Will call if BP goes back up on once daily lisinopril and and will change to Cozaar 100 mg daily   4. HLD - continue statin labs with primary target LDL less than 70    Time spent reviewing chart, writing note and direct conversation with patient 20 minutes   Medication Adjustments/Labs and Tests Ordered: Current medicines are reviewed at length with the patient today.  Concerns regarding medicines are outlined above.  Medication changes, Labs and Tests ordered today are listed in the Patient Instructions below. Patient Instructions  Medication Instructions:  Your physician has recommended you make the following change in your medication:  Decrease Aspirin to 81 mg Daily    Labwork: NONE   Testing/Procedures: TTE for AS   In a year   F/U office visit at that time    Signed, Charlton Haws, MD  10/01/2020 8:46 AM    [facility] [address] [place], [place] [address] Phone: [phone]

## 2020-10-01 ENCOUNTER — Encounter: Payer: Self-pay | Admitting: Cardiovascular Disease

## 2020-10-01 ENCOUNTER — Other Ambulatory Visit: Payer: Self-pay

## 2020-10-01 ENCOUNTER — Ambulatory Visit (INDEPENDENT_AMBULATORY_CARE_PROVIDER_SITE_OTHER): Payer: BC Managed Care – PPO | Admitting: Cardiovascular Disease

## 2020-10-01 VITALS — BP 130/60 | HR 70 | Ht 69.0 in | Wt 179.0 lb

## 2020-10-01 DIAGNOSIS — I350 Nonrheumatic aortic (valve) stenosis: Secondary | ICD-10-CM

## 2020-10-01 DIAGNOSIS — Z951 Presence of aortocoronary bypass graft: Secondary | ICD-10-CM | POA: Diagnosis not present

## 2020-10-01 NOTE — Patient Instructions (Signed)
Medication Instructions:  Your physician recommends that you continue on your current medications as directed. Please refer to the Current Medication list given to you today.  *If you need a refill on your cardiac medications before your next appointment, please call your pharmacy*   Lab Work: NONE   If you have labs (blood work) drawn today and your tests are completely normal, you will receive your results only by: MyChart Message (if you have MyChart) OR A paper copy in the mail If you have any lab test that is abnormal or we need to change your treatment, we will call you to review the results.   Testing/Procedures: Your physician has requested that you have an echocardiogram. Echocardiography is a painless test that uses sound waves to create images of your heart. It provides your doctor with information about the size and shape of your heart and how well your heart's chambers and valves are working. This procedure takes approximately one hour. There are no restrictions for this procedure.    Follow-Up: At [facility], you and your health needs are our priority.  As part of our continuing mission to provide you with exceptional heart care, we have created designated Provider Care Teams.  These Care Teams include your primary Cardiologist (physician) and Advanced Practice Providers (APPs -  Physician Assistants and Nurse Practitioners) who all work together to provide you with the care you need, when you need it.  We recommend signing up for the patient portal called "MyChart".  Sign up information is provided on this After Visit Summary.  MyChart is used to connect with patients for Virtual Visits (Telemedicine).  Patients are able to view lab/test results, encounter notes, upcoming appointments, etc.  Non-urgent messages can be sent to your provider as well.   To learn more about what you can do with MyChart, go to [email or web address].    Your next appointment:   1  year(s)  The format for your next appointment:   In Person  Provider:   Peter Nishan, MD   Other Instructions Thank you for choosing [facility]! ]   

## 2020-10-08 DIAGNOSIS — I10 Essential (primary) hypertension: Secondary | ICD-10-CM | POA: Diagnosis not present

## 2020-10-08 DIAGNOSIS — I352 Nonrheumatic aortic (valve) stenosis with insufficiency: Secondary | ICD-10-CM | POA: Diagnosis not present

## 2020-12-27 DIAGNOSIS — F419 Anxiety disorder, unspecified: Secondary | ICD-10-CM | POA: Diagnosis not present

## 2020-12-27 DIAGNOSIS — I10 Essential (primary) hypertension: Secondary | ICD-10-CM | POA: Diagnosis not present

## 2021-02-12 DIAGNOSIS — I10 Essential (primary) hypertension: Secondary | ICD-10-CM | POA: Diagnosis not present

## 2021-02-12 DIAGNOSIS — F419 Anxiety disorder, unspecified: Secondary | ICD-10-CM | POA: Diagnosis not present

## 2021-03-18 ENCOUNTER — Ambulatory Visit (HOSPITAL_COMMUNITY): Admission: RE | Admit: 2021-03-18 | Payer: BC Managed Care – PPO | Source: Ambulatory Visit

## 2021-06-18 DIAGNOSIS — E785 Hyperlipidemia, unspecified: Secondary | ICD-10-CM | POA: Diagnosis not present

## 2021-06-18 DIAGNOSIS — D751 Secondary polycythemia: Secondary | ICD-10-CM | POA: Diagnosis not present

## 2021-06-18 DIAGNOSIS — R7303 Prediabetes: Secondary | ICD-10-CM | POA: Diagnosis not present

## 2021-06-18 DIAGNOSIS — I10 Essential (primary) hypertension: Secondary | ICD-10-CM | POA: Diagnosis not present

## 2021-06-18 DIAGNOSIS — I2510 Atherosclerotic heart disease of native coronary artery without angina pectoris: Secondary | ICD-10-CM | POA: Diagnosis not present

## 2021-06-27 DIAGNOSIS — I10 Essential (primary) hypertension: Secondary | ICD-10-CM | POA: Diagnosis not present

## 2021-06-27 DIAGNOSIS — R7309 Other abnormal glucose: Secondary | ICD-10-CM | POA: Diagnosis not present

## 2021-06-27 DIAGNOSIS — Z23 Encounter for immunization: Secondary | ICD-10-CM | POA: Diagnosis not present

## 2021-06-27 DIAGNOSIS — E1159 Type 2 diabetes mellitus with other circulatory complications: Secondary | ICD-10-CM | POA: Diagnosis not present

## 2021-06-27 DIAGNOSIS — I350 Nonrheumatic aortic (valve) stenosis: Secondary | ICD-10-CM | POA: Diagnosis not present

## 2021-09-23 DIAGNOSIS — E1159 Type 2 diabetes mellitus with other circulatory complications: Secondary | ICD-10-CM | POA: Diagnosis not present

## 2021-09-23 DIAGNOSIS — I350 Nonrheumatic aortic (valve) stenosis: Secondary | ICD-10-CM | POA: Diagnosis not present

## 2021-09-23 DIAGNOSIS — E785 Hyperlipidemia, unspecified: Secondary | ICD-10-CM | POA: Diagnosis not present

## 2021-09-23 DIAGNOSIS — Z79899 Other long term (current) drug therapy: Secondary | ICD-10-CM | POA: Diagnosis not present

## 2021-09-23 DIAGNOSIS — I10 Essential (primary) hypertension: Secondary | ICD-10-CM | POA: Diagnosis not present

## 2021-09-23 DIAGNOSIS — Z125 Encounter for screening for malignant neoplasm of prostate: Secondary | ICD-10-CM | POA: Diagnosis not present

## 2021-09-23 DIAGNOSIS — I2510 Atherosclerotic heart disease of native coronary artery without angina pectoris: Secondary | ICD-10-CM | POA: Diagnosis not present

## 2021-09-26 ENCOUNTER — Other Ambulatory Visit (HOSPITAL_COMMUNITY): Payer: Self-pay | Admitting: Cardiovascular Disease

## 2021-09-26 ENCOUNTER — Ambulatory Visit (HOSPITAL_COMMUNITY)
Admission: RE | Admit: 2021-09-26 | Discharge: 2021-09-26 | Disposition: A | Discharge status: Home / Self Care | Payer: Medicare HMO | Source: Ambulatory Visit | Attending: Cardiovascular Disease | Admitting: Cardiovascular Disease

## 2021-09-26 DIAGNOSIS — I350 Nonrheumatic aortic (valve) stenosis: Secondary | ICD-10-CM | POA: Insufficient documentation

## 2021-09-26 LAB — ECHOCARDIOGRAM COMPLETE
AR max vel: 0.95 cm2
AV Area VTI: 0.94 cm2
AV Area mean vel: 0.91 cm2
AV Mean grad: 38.5 mmHg
AV Peak grad: 61.2 mmHg
Ao pk vel: 3.91 m/s
Area-P 1/2: 3.08 cm2
P 1/2 time: 338 msec
S' Lateral: 3.4 cm

## 2021-09-26 NOTE — Progress Notes (Signed)
*  PRELIMINARY RESULTS* Echocardiogram 2D Echocardiogram has been performed.  Stacey Drain 09/26/2021, 3:04 PM

## 2021-10-02 DIAGNOSIS — E785 Hyperlipidemia, unspecified: Secondary | ICD-10-CM | POA: Diagnosis not present

## 2021-10-02 DIAGNOSIS — I10 Essential (primary) hypertension: Secondary | ICD-10-CM | POA: Diagnosis not present

## 2021-10-02 DIAGNOSIS — E119 Type 2 diabetes mellitus without complications: Secondary | ICD-10-CM | POA: Diagnosis not present

## 2021-10-02 DIAGNOSIS — R7309 Other abnormal glucose: Secondary | ICD-10-CM | POA: Diagnosis not present

## 2021-10-15 NOTE — Progress Notes (Signed)
Cardiology Office Note    Date:  10/21/2021   ID:  Samuel Cook, Samuel Cook 03-Sep-1955, MRN [id number]  PCP:  Asencion Noble, MD  Cardiologist: Jenkins Rouge, MD     66 y.o. with history of CAD CABG May 2018  with LIMA-LAD, reverse SVG-OM, and reverse SVG-PDA), aortic stenosis, HTN, and HLD who presents to the office today for 66-month follow-up.Apparently at time of his surgery with Dr Servando Snare TEE intraop suggested that the AV did not need to be replaced   Compliant with meds BP at home fine No angina, dyspnea palpitations or syncope   Discussed possible role of TAVR if valve progresses to severe AS   Echo 08/2018 with moderate AS mean gradient 19 mmHg peak 38 mmHg   Echo 08/29/19 reviewed EF 55-60% moderate AS mean gradient 22 peak 37 mmHg DVI 0.43 AVA 1.1 cm2 Echo 08/20/20 EF 55-60% mean gradient 29 mmHg peak 50 mmHg DVI 0.36  Echo 09/26/21 EF 55-60% mean gradient 38.5 peak 61.2 AVA 0.94 and DVI 0.39   [place]. 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 Process involving cath, CTA and surgical consultation discussed at length. He is willing to proceed  Have notified structural team [place] and CT Navigator Lisabeth Pick   Never got his metal garage up Wife having him redo the kitchen Has been pellet gun Hunting with his grandson    Past Medical History:  Diagnosis Date   Anxiety    Aortic stenosis    Coronary artery disease    a. s/p CABG in 12/2016 with LIMA-LAD, reverse SVG-OM, and reverse SVG-PDA   GERD (gastroesophageal reflux disease)    occasionally   Hyperlipidemia    Hypertension     Past Surgical History:  Procedure Laterality Date   CARDIAC CATHETERIZATION     CORONARY ARTERY BYPASS  GRAFT N/A 12/26/2016   Procedure: CORONARY ARTERY BYPASS GRAFTING (CABG) x 3 WITH ENDOSCOPIC HARVESTING OF RIGHT SAPHENOUS VEIN;  Surgeon: Grace Isaac, MD;  Location: [facility];  Service: Open Heart Surgery;  Laterality: N/A;   LEFT HEART CATH AND CORONARY ANGIOGRAPHY N/A 12/12/2016   Procedure: Left Heart Cath and Coronary Angiography;  Surgeon: Belva Crome, MD;  Location: [facility] CV [facility];  Service: Cardiovascular;  Laterality: N/A;   none     none   TEE WITHOUT CARDIOVERSION N/A 12/26/2016   Procedure: TRANSESOPHAGEAL ECHOCARDIOGRAM (TEE);  Surgeon: Grace Isaac, MD;  Location: [facility];  Service: Open Heart Surgery;  Laterality: N/A;    Current Medications: Outpatient Medications Prior to Visit  Medication Sig Dispense Refill   acetaminophen (TYLENOL) 500 MG tablet Take 1,000 mg by mouth 2 (two) times daily.     aspirin EC 81 MG tablet Take 1 tablet (81 mg total) by mouth daily. 90 tablet 3   chlorthalidone (HYGROTON) 25 MG tablet TAKE 1 TABLET(25 MG) BY MOUTH DAILY 30 tablet 0   lisinopril (ZESTRIL) 20 MG tablet TAKE 1 TABLET BY MOUTH EVERY DAY 90 tablet 3   LORazepam (ATIVAN) 0.5 MG tablet Take 1 tablet (0.5 mg total) by mouth 2 (two) times daily as needed. for anxiety 60 tablet 0   nebivolol (BYSTOLIC) 10 MG  tablet Take 10 mg by mouth daily.     rosuvastatin (CRESTOR) 40 MG tablet TAKE 1 TABLET BY MOUTH EVERY DAY 90 tablet 0   No facility-administered medications prior to visit.     Allergies:   Norvasc [amlodipine besylate]   Social History   Socioeconomic History   Marital status: Married    Spouse name: Not on file   Number of children: Not on file   Years of education: Not on file   Highest education level: Not on file  Occupational History   Not on file  Tobacco Use   Smoking status: Former    Packs/day: 1.00    Years: 20.00    Pack years: 20.00    Types: Cigarettes    Quit date: 10/20/1993    Years since quitting: 28.0   Smokeless tobacco: Current     Types: Snuff  Vaping Use   Vaping Use: Never used  Substance and Sexual Activity   Alcohol use: Yes    Alcohol/week: 1.0 standard drink    Types: 1 Cans of beer per week    Comment: rare    Drug use: Yes    Types: Marijuana, Codeine    Comment: occasionally....takes 1 puff when really stressed   Sexual activity: Yes  Other Topics Concern   Not on file  Social History Narrative   Not on file   Social Determinants of Health   Financial Resource Strain: Not on file  Food Insecurity: Not on file  Transportation Needs: Not on file  Physical Activity: Not on file  Stress: Not on file  Social Connections: Not on file     Family History:  The patient's family history includes Birth defects in his maternal aunt; Cancer in his mother; Early death in his mother; Hearing loss in his maternal uncle; Heart disease in his maternal grandmother; Hyperlipidemia in his maternal aunt; Stroke in his father.   Review of Systems:   Please see the history of present illness.     General:  No chills, fever, night sweats or weight changes.  Cardiovascular:  No chest pain, dyspnea on exertion, edema, orthopnea, palpitations, paroxysmal nocturnal dyspnea. Dermatological: No rash, lesions/masses Respiratory: No cough, dyspnea Urologic: No hematuria, dysuria Abdominal:   No nausea, vomiting, diarrhea, bright red blood per rectum, melena, or hematemesis Neurologic:  No visual changes, wkns, changes in mental status.  He denies any of the above symptoms.   All other systems reviewed and are otherwise negative except as noted above.   Physical Exam:    BP (!) 142/60    Pulse (!) 56    Ht 5\' 10"  (1.778 m)    Wt 195 lb (88.5 kg)    SpO2 97%    BMI 27.98 kg/m  Affect appropriate Healthy:  appears stated age 66: normal Neck supple with no adenopathy JVP normal no bruits no thyromegaly Lungs clear with no wheezing and good diaphragmatic motion Heart:  S1/S2 AS  murmur, no rub, gallop or  click PMI normal post sternotomy  Abdomen: benighn, BS positve, no tenderness, no AAA no bruit.  No HSM or HJR Distal pulses intact with no bruits No edema Neuro non-focal Skin warm and dry No muscular weakness    Wt Readings from Last 3 Encounters:  10/21/21 195 lb (88.5 kg)  10/01/20 179 lb (81.2 kg)  01/31/20 185 lb 9.6 oz (84.2 kg)     Studies/Labs Reviewed:   EKG:  01/31/20 SR rate 66 LBBB   Recent Labs: No  results found for requested labs within last 8760 hours.   Lipid Panel    Component Value Date/Time   CHOL 155 07/04/2019 0812   TRIG 116 07/04/2019 0812   HDL 45 07/04/2019 0812   CHOLHDL 3.4 07/04/2019 0812   VLDL 19 02/02/2017 0839   LDLCALC 89 07/04/2019 0812    Additional studies/ records that were reviewed today include:   Echocardiogram:  08/20/20    IMPRESSIONS     1. Left ventricular ejection fraction, by estimation, is 60 to 65%. The  left ventricle has normal function. The left ventricle has no regional  wall motion abnormalities. There is mild left ventricular hypertrophy.  Left ventricular diastolic parameters  were normal.   2. Right ventricular systolic function is normal. The right ventricular  size is normal.   3. Left atrial size was mildly dilated.   4. The mitral valve is degenerative. Trivial mitral valve regurgitation.  No evidence of mitral stenosis. Moderate mitral annular calcification.   5. Progression of AS now mean gradient 19 mmHg-> 30 mmHg with DVI 0.36  and AVA around 1.1 cm2. The aortic valve is normal in structure. There is  severe calcifcation of the aortic valve. There is severe thickening of the  aortic valve. Aortic valve  regurgitation is mild. Moderate to severe aortic valve stenosis.   6. The inferior vena cava is normal in size with greater than 50%  respiratory variability, suggesting right atrial pressure of 3 mmHg.    Assessment:    CAD/CABG  And AS   Plan:   In order of problems listed above:  1.  CAD - s/p CABG in 12/2016 with LIMA-LAD, reverse SVG-OM, and reverse SVG-PDA.  No angina continue 81 mg ASA , beta blocker and statin   2. Aortic Stenosis -  Progressive now in severe range by AVA and mean gradient right at border. Will refer to Dr Cyndia Bent for evaluation and plan TAVR CTAls to see if he is a candidate   3. HTN - Well controlled.  Will call if BP goes back up on once daily lisinopril and and will change to Cozaar 100 mg daily   4. HLD - continue statin labs with primary target LDL less than 70     Medication Adjustments/Labs and Tests Ordered: Current medicines are reviewed at length with the patient today.  Concerns regarding medicines are outlined above.  Medication changes, Labs and Tests ordered today are listed in the Patient Instructions below. Patient Instructions  Medication Instructions:  Your physician has recommended you make the following change in your medication:  Decrease Aspirin to 81 mg Daily    Labwork: NONE   Testing/Procedures:  TAVR CTAls Refer to Dr Cyndia Bent ? Redo vs TAVR Refer Cooper/McAlhaney arrange cath to clear cors BMET today     Signed, Jenkins Rouge, MD  10/21/2021 10:05 AM    [place] [id number] [place], [place] [address] Phone: [phone]

## 2021-10-21 ENCOUNTER — Other Ambulatory Visit: Payer: Self-pay

## 2021-10-21 ENCOUNTER — Ambulatory Visit: Payer: BC Managed Care – PPO

## 2021-10-21 ENCOUNTER — Other Ambulatory Visit (HOSPITAL_COMMUNITY)
Admission: RE | Admit: 2021-10-21 | Discharge: 2021-10-21 | Disposition: A | Discharge status: Home / Self Care | Payer: Medicare HMO | Source: Ambulatory Visit | Attending: Cardiovascular Disease | Admitting: Cardiovascular Disease

## 2021-10-21 ENCOUNTER — Encounter: Payer: Self-pay | Admitting: Cardiovascular Disease

## 2021-10-21 ENCOUNTER — Ambulatory Visit: Payer: BC Managed Care – PPO | Admitting: Cardiovascular Disease

## 2021-10-21 ENCOUNTER — Ambulatory Visit: Payer: Medicare HMO | Admitting: Cardiovascular Disease

## 2021-10-21 VITALS — BP 142/60 | HR 56 | Ht 70.0 in | Wt 195.0 lb

## 2021-10-21 DIAGNOSIS — I350 Nonrheumatic aortic (valve) stenosis: Secondary | ICD-10-CM

## 2021-10-21 DIAGNOSIS — I10 Essential (primary) hypertension: Secondary | ICD-10-CM | POA: Diagnosis not present

## 2021-10-21 DIAGNOSIS — Z951 Presence of aortocoronary bypass graft: Secondary | ICD-10-CM

## 2021-10-21 LAB — BASIC METABOLIC PANEL
Anion gap: 7 (ref 5–15)
BUN: 22 mg/dL (ref 8–23)
CO2: 28 mmol/L (ref 22–32)
Calcium: 9.4 mg/dL (ref 8.9–10.3)
Chloride: 99 mmol/L (ref 98–111)
Creatinine, Ser: 1.1 mg/dL (ref 0.61–1.24)
GFR, Estimated: >60 mL/min (ref >60)
Glucose, Bld: 139 mg/dL — ABNORMAL HIGH (ref 70–99)
Potassium: 3.6 mmol/L (ref 3.5–5.1)
Sodium: 134 mmol/L — ABNORMAL LOW (ref 135–145)

## 2021-10-21 NOTE — Addendum Note (Signed)
Addended by: Leonides Schanz C on: 10/21/2021 10:32 AM ? ? Modules accepted: Orders ? ?

## 2021-10-21 NOTE — Addendum Note (Signed)
Addended by: Leonides Schanz C on: 10/21/2021 10:34 AM ? ? Modules accepted: Orders ? ?

## 2021-10-21 NOTE — Patient Instructions (Addendum)
Labwork: ?BMET- TOday ? ?Testing/Procedures: ?Cardiac CT- TAVR ? ?Follow-Up: ?Follow up with Dr. Cyndia Bent- ASAP ?Follow up with Dr. MacAlhaney/Cooper ? ?Any Other Special Instructions Will Be Listed Below (If Applicable). ? ? ? ? ?If you need a refill on your cardiac medications before your next appointment, please call your pharmacy. ? ? ? ?Your cardiac CT will be scheduled at one of the below locations:  ? ?[facility] ?[address] ?[place], [place] [address] ?[phone] ? ?OR ? ?[place] ?[address] ?[address] ?[place], [place] [address] ?[phone] ? ?If scheduled at [facility], please arrive at the [facility] and Children's Entrance (Entrance C2) of [facility] 30 minutes prior to test start time. ?You can use the FREE valet parking offered at entrance C (encouraged to control the heart rate for the test)  ?Proceed to the [facility] Radiology Department (first floor) to check-in and test prep. ? ?All radiology patients and guests should use entrance C2 at [facility], accessed from [facility], even though the hospital's physical address listed is [address] ? ? ? ?If scheduled at [facility], please arrive 15 mins early for check-in and test prep. ? ?Please follow these instructions carefully (unless otherwise directed): ? ?Hold all erectile dysfunction medications at least 3 days (72 hrs) prior to test. ? ?On the Night Before the Test: ?Be sure to Drink plenty of water. ?Do not consume any caffeinated/decaffeinated beverages or chocolate 12 hours prior to your test. ?Do not take any antihistamines 12 hours prior to your test. ? ?On the Day of the Test: ?Drink plenty of water until 1 hour prior to the test. ?Do not eat any food 4 hours prior to the test. ?You may take your regular medications prior to the test.  ?Take metoprolol (Lopressor) two hours prior to test. ?HOLD  Furosemide/Hydrochlorothiazide morning of the test. ? ?     ?After the Test: ?Drink plenty of water. ?After receiving IV contrast, you may experience a mild flushed feeling. This is normal. ?On occasion, you may experience a mild rash up to 24 hours after the test. This is not dangerous. If this occurs, you can take Benadryl 25 mg and increase your fluid intake. ?If you experience trouble breathing, this can be serious. If it is severe call 911 IMMEDIATELY. If it is mild, please call our office. ?If you take any of these medications: Glipizide/Metformin, Avandament, Glucavance, please do not take 48 hours after completing test unless otherwise instructed. ? ?We will call to schedule your test 2-4 weeks out understanding that some insurance companies will need an authorization prior to the service being performed.  ? ?For non-scheduling related questions, please contact the cardiac imaging nurse navigator should you have any questions/concerns: ?Marchia Bond, Cardiac Imaging Nurse Navigator ?Gordy Clement, Cardiac Imaging Nurse Navigator ?[facility] ?Direct Office Dial: [phone]  ? ?For scheduling needs, including cancellations and rescheduling, please call [place], [phone]. ? ?

## 2021-10-31 ENCOUNTER — Ambulatory Visit (HOSPITAL_COMMUNITY)
Admission: RE | Admit: 2021-10-31 | Discharge: 2021-10-31 | Disposition: A | Discharge status: Home / Self Care | Payer: Medicare HMO | Source: Ambulatory Visit | Attending: Cardiovascular Disease | Admitting: Cardiovascular Disease

## 2021-10-31 ENCOUNTER — Other Ambulatory Visit: Payer: Self-pay

## 2021-10-31 DIAGNOSIS — I350 Nonrheumatic aortic (valve) stenosis: Secondary | ICD-10-CM | POA: Diagnosis not present

## 2021-10-31 DIAGNOSIS — K5730 Diverticulosis of large intestine without perforation or abscess without bleeding: Secondary | ICD-10-CM | POA: Diagnosis not present

## 2021-10-31 DIAGNOSIS — I700 Atherosclerosis of aorta: Secondary | ICD-10-CM | POA: Diagnosis not present

## 2021-10-31 DIAGNOSIS — K449 Diaphragmatic hernia without obstruction or gangrene: Secondary | ICD-10-CM | POA: Diagnosis not present

## 2021-10-31 MED ORDER — IOHEXOL 350 MG/ML SOLN
95.0000 mL | Freq: Once | INTRAVENOUS | Status: AC | PRN
  Administered 2021-10-31: 95 mL via INTRAVENOUS

## 2021-11-04 NOTE — Progress Notes (Addendum)
? ?Structural Heart Clinic Consult Note ? ?Chief Complaint  ?Patient presents with  ? New Patient (Initial Visit)  ?  Severe aortic stenosis  ? ?History of Present Illness: 66 yo male with history of CAD s/p 3V CABG, HTN, hyperlipidemia and severe aortic stenosis who is here today as a new consult, referred by Dr. Eden Emms, for further discussion regarding his aortic stenosis and possible TAVR. He underwent 3V CABG in 2018 (LIMA to LAD, SVG to OM, SVG to PDA). He has been followed by Dr. Eden Emms for moderate aortic stenosis since then. Echo 09/26/21 with LVEF=55-60%, trivial MR. Severe aortic stenosis with mean gradient 38.5 mmHg, peak gradient 61 mmHg, AVA 0.91 cm2. Mild AI. He saw Dr. Eden Emms 10/21/21 and reported progressive dyspnea on exertion and fatigue. Coronary CTA 10/31/21 with aortic valve calcium score of 2747. Moderate AI. Apparent occlusion of both vein grafts with patent LIMA to LAD. CTA abdomen/pelvis with calcified iliofemoral vessels with borderline access.  ? ?He tells me today that he has had dyspnea with heavy exertion such as walking up a hill. He has no chest pain or dizziness. He does feel lightheaded if he has a coughing episode. No LE edema.  ?He lives in [place], [place] with his wife. He is a retired Armed forces training and education officer. He has poor dentition and does not see a dentist. He thinks he has several teeth that are broken.  ? ?Primary Care Physician: Carylon Perches, MD ?Primary Cardiologist: Eden Emms ?Referring Cardiologist: Eden Emms ? ?Past Medical History:  ?Diagnosis Date  ? Anxiety   ? Aortic stenosis   ? Coronary artery disease   ? a. s/p CABG in 12/2016 with LIMA-LAD, reverse SVG-OM, and reverse SVG-PDA  ? GERD (gastroesophageal reflux disease)   ? occasionally  ? Hyperlipidemia   ? Hypertension   ? ? ?Past Surgical History:  ?Procedure Laterality Date  ? CARDIAC CATHETERIZATION    ? CORONARY ARTERY BYPASS GRAFT N/A 12/26/2016  ? Procedure: CORONARY ARTERY BYPASS GRAFTING (CABG) x 3 WITH ENDOSCOPIC  HARVESTING OF RIGHT SAPHENOUS VEIN;  Surgeon: Delight Ovens, MD;  Location: [facility] OR;  Service: Open Heart Surgery;  Laterality: N/A;  ? LEFT HEART CATH AND CORONARY ANGIOGRAPHY N/A 12/12/2016  ? Procedure: Left Heart Cath and Coronary Angiography;  Surgeon: Lyn Records, MD;  Location: [facility] INVASIVE CV LAB;  Service: Cardiovascular;  Laterality: N/A;  ? none    ? none  ? TEE WITHOUT CARDIOVERSION N/A 12/26/2016  ? Procedure: TRANSESOPHAGEAL ECHOCARDIOGRAM (TEE);  Surgeon: Delight Ovens, MD;  Location: [facility] OR;  Service: Open Heart Surgery;  Laterality: N/A;  ? ? ?Current Outpatient Medications  ?Medication Sig Dispense Refill  ? acetaminophen (TYLENOL) 500 MG tablet Take 1,000 mg by mouth 2 (two) times daily.    ? aspirin EC 81 MG tablet Take 1 tablet (81 mg total) by mouth daily. 90 tablet 3  ? chlorthalidone (HYGROTON) 25 MG tablet TAKE 1 TABLET(25 MG) BY MOUTH DAILY 30 tablet 0  ? lisinopril (ZESTRIL) 20 MG tablet TAKE 1 TABLET BY MOUTH EVERY DAY 90 tablet 3  ? LORazepam (ATIVAN) 0.5 MG tablet Take 1 tablet (0.5 mg total) by mouth 2 (two) times daily as needed. for anxiety 60 tablet 0  ? nebivolol (BYSTOLIC) 10 MG tablet Take 10 mg by mouth daily.    ? rosuvastatin (CRESTOR) 40 MG tablet TAKE 1 TABLET BY MOUTH EVERY DAY 90 tablet 0  ? ?No current facility-administered medications for this visit.  ? ? ?Allergies  ?Allergen Reactions  ?  Norvasc [Amlodipine Besylate] Other (See Comments)  ?  Headache  ? ? ?Social History  ? ?Socioeconomic History  ? Marital status: Married  ?  Spouse name: Not on file  ? Number of children: 4  ? Years of education: Not on file  ? Highest education level: Not on file  ?Occupational History  ? Occupation: Retired-Sheet Geneticist, molecular  ?Tobacco Use  ? Smoking status: Former  ?  Packs/day: 1.00  ?  Years: 20.00  ?  Pack years: 20.00  ?  Types: Cigarettes  ?  Quit date: 10/20/1993  ?  Years since quitting: 28.0  ? Smokeless tobacco: Current  ?  Types: Snuff  ?Vaping Use  ? Vaping Use:  Never used  ?Substance and Sexual Activity  ? Alcohol use: Not Currently  ?  Alcohol/week: 1.0 standard drink  ?  Types: 1 Cans of beer per week  ?  Comment: rare   ? Drug use: Yes  ?  Types: Marijuana, Codeine  ?  Comment: occasionally....takes 1 puff when really stressed  ? Sexual activity: Yes  ?Other Topics Concern  ? Not on file  ?Social History Narrative  ? Not on file  ? ?Social Determinants of Health  ? ?Financial Resource Strain: Not on file  ?Food Insecurity: Not on file  ?Transportation Needs: Not on file  ?Physical Activity: Not on file  ?Stress: Not on file  ?Social Connections: Not on file  ?Intimate Partner Violence: Not on file  ? ? ?Family History  ?Problem Relation Age of Onset  ? Cancer Mother   ?     Lung cancer  ? Early death Mother   ? Stroke Father   ? Heart disease Maternal Grandmother   ? Birth defects Maternal Aunt   ? Hyperlipidemia Maternal Aunt   ? Hearing loss Maternal Uncle   ? ? ?Review of Systems:  As stated in the HPI and otherwise negative.  ? ?BP (!) 150/62   Pulse (!) 55   Ht 5\' 10"  (1.778 m)   Wt 197 lb 6.6 oz (89.5 kg)   SpO2 99%   BMI 28.33 kg/m?  ? ?Physical Examination: ?General: Well developed, well nourished, NAD  ?HEENT: OP clear, mucus membranes moist  ?SKIN: warm, dry. No rashes. ?Neuro: No focal deficits  ?Musculoskeletal: Muscle strength 5/5 all ext  ?Psychiatric: Mood and affect normal  ?Neck: No JVD, no carotid bruits, no thyromegaly, no lymphadenopathy.  ?Lungs:Clear bilaterally, no wheezes, rhonci, crackles ?Cardiovascular: Regular rate and rhythm. Loud, harsh, late peaking systolic murmur.  ?Abdomen:Soft. Bowel sounds present. Non-tender.  ?Extremities: No lower extremity edema. Pulses are 2 + in the bilateral DP/PT. ? ?EKG:  EKG is not ordered today. ?The ekg ordered today demonstrates  ? ?Echo 09/26/21: ? 1. Left ventricular ejection fraction, by estimation, is 55 to 60%. The  ?left ventricle has normal function. The left ventricle has no regional  ?wall  motion abnormalities. Left ventricular diastolic parameters were  ?normal.  ? 2. Right ventricular systolic function is normal. The right ventricular  ?size is normal.  ? 3. Left atrial size was mildly dilated.  ? 4. The mitral valve is degenerative. Trivial mitral valve regurgitation.  ?No evidence of mitral stenosis.  ? 5. AS more severe than 08/20/20 mean gradient 29->38.5 peak 50 -> 61.2 mmHg  ?AVA about the same and DVI about the same. The aortic valve is tricuspid.  ?There is moderate calcification of the aortic valve. There is moderate  ?thickening of the aortic valve.  ?  Aortic valve regurgitation is mild. Moderate to severe aortic valve  ?stenosis.  ? 6. The inferior vena cava is normal in size with greater than 50%  ?respiratory variability, suggesting right atrial pressure of 3 mmHg.  ? ?FINDINGS  ? Left Ventricle: Left ventricular ejection fraction, by estimation, is 55  ?to 60%. The left ventricle has normal function. The left ventricle has no  ?regional wall motion abnormalities. The left ventricular internal cavity  ?size was normal in size. There is  ? no left ventricular hypertrophy. Left ventricular diastolic parameters  ?were normal.  ? ?Right Ventricle: The right ventricular size is normal. No increase in  ?right ventricular wall thickness. Right ventricular systolic function is  ?normal.  ? ?Left Atrium: Left atrial size was mildly dilated.  ? ?Right Atrium: Right atrial size was normal in size.  ? ?Pericardium: There is no evidence of pericardial effusion.  ? ?Mitral Valve: The mitral valve is degenerative in appearance. There is  ?mild thickening of the mitral valve leaflet(s). There is mild  ?calcification of the mitral valve leaflet(s). Mild mitral annular  ?calcification. Trivial mitral valve regurgitation. No  ?evidence of mitral valve stenosis.  ? ?Tricuspid Valve: The tricuspid valve is normal in structure. Tricuspid  ?valve regurgitation is not demonstrated. No evidence of tricuspid   ?stenosis.  ? ?Aortic Valve: AS more severe than 08/20/20 mean gradient 29->38.5 peak 50 ->  ?61.2 mmHg AVA about the same and DVI about the same. The aortic valve is  ?tricuspid. There is moderate calcification of the a

## 2021-11-05 ENCOUNTER — Encounter: Payer: Self-pay | Admitting: Cardiovascular Disease

## 2021-11-05 ENCOUNTER — Other Ambulatory Visit: Payer: Self-pay

## 2021-11-05 ENCOUNTER — Ambulatory Visit: Payer: Medicare HMO | Admitting: Cardiovascular Disease

## 2021-11-05 VITALS — BP 150/62 | HR 55 | Ht 70.0 in | Wt 197.4 lb

## 2021-11-05 DIAGNOSIS — I350 Nonrheumatic aortic (valve) stenosis: Secondary | ICD-10-CM | POA: Diagnosis not present

## 2021-11-05 NOTE — Progress Notes (Signed)
Pre Surgical Assessment: 5 M Walk Test ? ?76M=16.13ft ? ?5 Meter Walk Test- trial 1: 5.13 seconds ?5 Meter Walk Test- trial 2: 4.46 seconds ?5 Meter Walk Test- trial 3: 4.98 seconds ?5 Meter Walk Test Average: 4.85 seconds ? ? ?

## 2021-11-05 NOTE — Patient Instructions (Addendum)
Medication Instructions:  ?No changes ?*If you need a refill on your cardiac medications before your next appointment, please call your pharmacy* ? ? ?Lab Work: ?None ? ? ?Follow-Up: ?Per Structural Heart Valve Team.  Julieta Gutting, RN, Nurse Navigator will contact you. ? ? ?

## 2021-11-12 DIAGNOSIS — K029 Dental caries, unspecified: Secondary | ICD-10-CM | POA: Diagnosis not present

## 2021-11-12 DIAGNOSIS — R69 Illness, unspecified: Secondary | ICD-10-CM | POA: Diagnosis not present

## 2021-11-14 ENCOUNTER — Telehealth: Payer: Self-pay | Admitting: Cardiovascular Disease

## 2021-11-14 DIAGNOSIS — R69 Illness, unspecified: Secondary | ICD-10-CM | POA: Diagnosis not present

## 2021-11-14 DIAGNOSIS — K029 Dental caries, unspecified: Secondary | ICD-10-CM | POA: Diagnosis not present

## 2021-11-14 NOTE — Telephone Encounter (Signed)
Spoke with Andrey Campanile at Dr. Frederik Pear office. ? ?Dr. Frederik Pear office is attempting to send records/notes from dental extraction but their fax machine is not working at this time. Juleen Starr we are only able to accept records via fax and do not have an e-mail that records can be sent to. ? ?Andrey Campanile states she will continue to troubleshoot their machine and fax records to our office when able. ?

## 2021-11-14 NOTE — Telephone Encounter (Signed)
? ?  Reed with Dr. Dutch Quint office calling, she said they need to send records and noted about pt's dental extractions but their fax is not working and asking for an email where they can send it too.  ?

## 2021-11-18 ENCOUNTER — Telehealth: Payer: Self-pay

## 2021-11-18 NOTE — Telephone Encounter (Signed)
Spoke with the patient about further TAVR testing/planning as he had several dental extractions 11/14/2021. ?While he says he feels mostly OK from the extractions, he would like to wait for testing for several more weeks.  ?Scheduled Samuel Cook for appointment with Carlean Jews on 01/15/2022 in preparation for [facility] 6/9. ?He will have appointment with Dr. Laneta Simmers 02/19/2022. ?He was grateful for call and agrees with plan.  ?

## 2021-12-11 ENCOUNTER — Encounter: Payer: Medicare HMO | Admitting: Surgery

## 2021-12-27 ENCOUNTER — Other Ambulatory Visit: Payer: Self-pay | Admitting: Family Medicine

## 2021-12-30 NOTE — Telephone Encounter (Signed)
Refused Zestril 20 mg refill because pt no longer being seen at [facility] per note on 09/18/2020. ?

## 2022-01-14 NOTE — H&P (View-Only) (Signed)
HEART AND [place]                                     Cardiology Office Note:    Date:  01/15/2022   ID:  Samuel Cook, DOB 07-Feb-1956, MRN [id number]  PCP:  Asencion Noble, MD  [facility] Cardiologist:  Jenkins Rouge, MD  [place] Electrophysiologist:  None   Referring MD: Asencion Noble, MD   Follow up aortic stenosis- set up heart cath   History of Present Illness:    Samuel Cook is a 66 y.o. male with a hx of CAD s/p 3V CABG, HTN, HLD, LBBB, and severe aortic stenosis who presents to clinic for follow up.   He underwent 3V CABG in 2018 (LIMA to LAD, SVG to OM, SVG to PDA). He has been followed by Dr. Johnsie Cancel for moderate aortic stenosis since then. Echo 09/26/21 with LVEF=55-60%, trivial MR. Severe aortic stenosis with mean gradient 38.5 mmHg, peak gradient 61 mmHg, AVA 0.91 cm2. Mild AI. He saw Dr. Johnsie Cancel 10/21/21 and reported progressive dyspnea on exertion and fatigue. Coronary CTA 10/31/21 with aortic valve calcium score of 2747. Moderate AI. Apparent occlusion of both vein grafts with patent LIMA to LAD. CTA abdomen/pelvis with calcified iliofemoral vessels with borderline access.    He was seen by Dr. Angelena Form for consideration of TAVR on 11/05/21. Plan was to let him have his poor dentition addressed and bring him back to clinic to arrange [facility].   Today the patient presents to clinic for follow up. Here with wife. He has had progressive fatigue and dyspnea. No chest pain. No LE edema, orthopnea or PND. He has occasional positional dizziness  but no syncope. No blood in stool or urine. No palpitations. He would like to be able to get more active working in the yard and chopping wood for his fire.    Past Medical History:  Diagnosis Date   Anxiety    Aortic stenosis    Coronary artery disease    a. s/p CABG in 12/2016 with LIMA-LAD, reverse SVG-OM, and reverse SVG-PDA   GERD (gastroesophageal reflux disease)    occasionally    Hyperlipidemia    Hypertension     Past Surgical History:  Procedure Laterality Date   CARDIAC CATHETERIZATION     CORONARY ARTERY BYPASS GRAFT N/A 12/26/2016   Procedure: CORONARY ARTERY BYPASS GRAFTING (CABG) x 3 WITH ENDOSCOPIC HARVESTING OF RIGHT SAPHENOUS VEIN;  Surgeon: Grace Isaac, MD;  Location: [facility];  Service: Open Heart Surgery;  Laterality: N/A;   LEFT HEART CATH AND CORONARY ANGIOGRAPHY N/A 12/12/2016   Procedure: Left Heart Cath and Coronary Angiography;  Surgeon: Belva Crome, MD;  Location: [facility] [facility];  Service: Cardiovascular;  Laterality: N/A;   none     none   TEE WITHOUT CARDIOVERSION N/A 12/26/2016   Procedure: TRANSESOPHAGEAL ECHOCARDIOGRAM (TEE);  Surgeon: Grace Isaac, MD;  Location: [facility];  Service: Open Heart Surgery;  Laterality: N/A;    Current Medications: Current Meds  Medication Sig   acetaminophen (TYLENOL) 500 MG tablet Take 1,000 mg by mouth 2 (two) times daily.   aspirin EC 81 MG tablet Take 1 tablet (81 mg total) by mouth daily.   chlorthalidone (HYGROTON) 25 MG tablet TAKE 1 TABLET(25 MG) BY MOUTH DAILY   lisinopril (ZESTRIL) 20 MG tablet TAKE 1 TABLET BY  MOUTH EVERY DAY   LORazepam (ATIVAN) 0.5 MG tablet Take 1 tablet (0.5 mg total) by mouth 2 (two) times daily as needed. for anxiety   nebivolol (BYSTOLIC) 10 MG tablet Take 10 mg by mouth daily.   rosuvastatin (CRESTOR) 40 MG tablet TAKE 1 TABLET BY MOUTH EVERY DAY     Allergies:   Norvasc [amlodipine besylate]   Social History   Socioeconomic History   Marital status: Married    Spouse name: Not on file   Number of children: 4   Years of education: Not on file   Highest education level: Not on file  Occupational History   Occupation: Retired-Sheet Engineer, manufacturing systems  Tobacco Use   Smoking status: Former    Packs/day: 1.00    Years: 20.00    Pack years: 20.00    Types: Cigarettes    Quit date: 10/20/1993    Years since quitting: 28.2   Smokeless tobacco: Current     Types: Snuff  Vaping Use   Vaping Use: Never used  Substance and Sexual Activity   Alcohol use: Not Currently    Alcohol/week: 1.0 standard drink    Types: 1 Cans of beer per week    Comment: rare    Drug use: Yes    Types: Marijuana, Codeine    Comment: occasionally....takes 1 puff when really stressed   Sexual activity: Yes  Other Topics Concern   Not on file  Social History Narrative   Not on file   Social Determinants of Health   Financial Resource Strain: Not on file  Food Insecurity: Not on file  Transportation Needs: Not on file  Physical Activity: Not on file  Stress: Not on file  Social Connections: Not on file     Family History: The patient's family history includes Birth defects in his maternal aunt; Cancer in his mother; Early death in his mother; Hearing loss in his maternal uncle; Heart disease in his maternal grandmother; Hyperlipidemia in his maternal aunt; Stroke in his father.  ROS:   Please see the history of present illness.    All other systems reviewed and are negative.  EKGs/Labs/Other Studies Reviewed:    The following studies were reviewed today:  Echo 09/26/21:  1. Left ventricular ejection fraction, by estimation, is 55 to 60%. The  left ventricle has normal function. The left ventricle has no regional  wall motion abnormalities. Left ventricular diastolic parameters were  normal.   2. Right ventricular systolic function is normal. The right ventricular  size is normal.   3. Left atrial size was mildly dilated.   4. The mitral valve is degenerative. Trivial mitral valve regurgitation.  No evidence of mitral stenosis.   5. AS more severe than 08/20/20 mean gradient 29->38.5 peak 50 -> 61.2 mmHg  AVA about the same and DVI about the same. The aortic valve is tricuspid.  There is moderate calcification of the aortic valve. There is moderate  thickening of the aortic valve.  Aortic valve regurgitation is mild. Moderate to severe aortic valve   stenosis.   6. The inferior vena cava is normal in size with greater than 50%  respiratory variability, suggesting right atrial pressure of 3 mmHg.   FINDINGS   Left Ventricle: Left ventricular ejection fraction, by estimation, is 55  to 60%. The left ventricle has normal function. The left ventricle has no  regional wall motion abnormalities. The left ventricular internal cavity  size was normal in size. There is   no left  ventricular hypertrophy. Left ventricular diastolic parameters  were normal.   Right Ventricle: The right ventricular size is normal. No increase in  right ventricular wall thickness. Right ventricular systolic function is  normal.   Left Atrium: Left atrial size was mildly dilated.   Right Atrium: Right atrial size was normal in size.   Pericardium: There is no evidence of pericardial effusion.   Mitral Valve: The mitral valve is degenerative in appearance. There is  mild thickening of the mitral valve leaflet(s). There is mild  calcification of the mitral valve leaflet(s). Mild mitral annular  calcification. Trivial mitral valve regurgitation. No  evidence of mitral valve stenosis.   Tricuspid Valve: The tricuspid valve is normal in structure. Tricuspid  valve regurgitation is not demonstrated. No evidence of tricuspid  stenosis.   Aortic Valve: AS more severe than 08/20/20 mean gradient 29->38.5 peak 50 ->  61.2 mmHg AVA about the same and DVI about the same. The aortic valve is  tricuspid. There is moderate calcification of the aortic valve. There is  moderate thickening of the aortic   valve. Aortic valve regurgitation is mild. Aortic regurgitation PHT  measures 338 msec. Moderate to severe aortic stenosis is present. Aortic  valve mean gradient measures 38.5 mmHg. Aortic valve peak gradient  measures 61.2 mmHg. Aortic valve area, by VTI   measures 0.94 cm.   Pulmonic Valve: The pulmonic valve was normal in structure. Pulmonic valve  regurgitation  is not visualized. No evidence of pulmonic stenosis.   Aorta: The aortic root is normal in size and structure.   Venous: The inferior vena cava is normal in size with greater than 50%  respiratory variability, suggesting right atrial pressure of 3 mmHg.   IAS/Shunts: No atrial level shunt detected by color flow Doppler.      LEFT VENTRICLE  PLAX 2D  LVIDd:         5.20 cm   Diastology  LVIDs:         3.40 cm   LV e' medial:    7.18 cm/s  LV PW:         1.00 cm   LV E/e' medial:  16.7  LV IVS:        1.00 cm   LV e' lateral:   7.29 cm/s  LVOT diam:     1.75 cm   LV E/e' lateral: 16.5  LV SV:         103  LV SV Index:   52  LVOT Area:     2.41 cm      RIGHT VENTRICLE  RV S prime:     9.25 cm/s  TAPSE (M-mode): 2.0 cm   LEFT ATRIUM             Index        RIGHT ATRIUM           Index  LA diam:        4.15 cm 2.11 cm/m   RA Area:     18.70 cm  LA Vol (A2C):   86.4 ml 43.84 ml/m  RA Volume:   52.70 ml  26.74 ml/m  LA Vol (A4C):   73.6 ml 37.34 ml/m  LA Biplane Vol: 79.6 ml 40.39 ml/m   AORTIC VALVE  AV Area (Vmax):    0.95 cm  AV Area (Vmean):   0.91 cm  AV Area (VTI):     0.94 cm  AV Vmax:  391.00 cm/s  AV Vmean:          295.000 cm/s  AV VTI:            1.095 m  AV Peak Grad:      61.2 mmHg  AV Mean Grad:      38.5 mmHg  LVOT Vmax:         154.00 cm/s  LVOT Vmean:        111.500 cm/s  LVOT VTI:          0.427 m  LVOT/AV VTI ratio: 0.39  AI PHT:            338 msec     AORTA  Ao Root diam: 3.00 cm   MITRAL VALVE  MV Area (PHT): 3.08 cm     SHUNTS  MV Decel Time: 246 msec     Systemic VTI:  0.43 m  MV E velocity: 120.00 cm/s  Systemic Diam: 1.75 cm  MV A velocity: 84.40 cm/s  MV E/A ratio:  1.42    ____________________  Coronary CT: March 2023 Cardiac TAVR CT   TECHNIQUE: The patient was scanned on a Siemens Force [id number] slice scanner. A 120 kV retrospective scan was triggered in the descending thoracic aorta at 111 HU's. Gantry rotation  speed was 270 msecs and collimation was .9 mm. No beta blockade or nitro were given. The 3D data set was reconstructed in 5% intervals of the R-R cycle. Systolic and diastolic phases were analyzed on a dedicated work station using MPR, MIP and VRT modes. The patient received 80 cc of contrast.   FINDINGS: Aortic Valve: Calcified tri leaflet with restricted leaflet motion Calcium score 2747   Aorta: No aneurysm normal arch vessels moderate calcific atherosclerosis   Sinotubular Junction: 26 mm   Ascending Thoracic Aorta: 34 mm   Aortic Arch: 26 mm   Descending Thoracic Aorta: 25 mm   Sinus of Valsalva Measurements:   Non-coronary: 29.2 mm   Right - coronary: 28 mm Sinus height 19 mm   Left - coronary: 29 mm  Sinus height 21 mm   Coronary Artery Height above Annulus:   Left Main: 13.6 mm above annulus   Right Coronary: 12.5 mm above annulus   Virtual Basal Annulus Measurements:   Maximum/Minimum Diameter: 22.2 mm x 26.2 mm   Perimeter: 75.2 mm   Area: 416.5 mm2   Coronary Arteries: Sufficient height above annulus for deployment The SVG to PDA and SVG to OM are occluded The LIMA to LAD is patent   Optimum Fluoroscopic Angle for Delivery: LAO 10 Caudal 6 degrees   Membranous septal length 6.3 mm   IMPRESSION: 1. Tri leaflet calcified AV with score 2747. Also central area of mal coaptation with moderate AR on TTE   2. Coronary arteries sufficient height above annulus for deployment Occluded SVG to RCA and OM. Patent LIMA to LAD   3. Annular area of 416.5 mm 2 suitable for a 23 mm Sapien 3 valve The right sinus diameter is a bit small for a 29 mm Evolute Pro valve   4. Membranous septal length 6.3 mm with baseline ECG LBBB moderate risk for CHB would also favor Sapien valve   5. Coronary arteries sufficient height above annulus for deployment Patent LIMA to LAD The SVGls to PDA and OM appear occluded   6. Optimum angiographic angle for deployment LAO 10  Caudal 6 degrees    EKG:  EKG is ordered today.  The ekg ordered  today demonstrates sinus brady with LBBB HR 58 bpm  Recent Labs: 10/21/2021: BUN 22; Creatinine, Ser 1.10; Potassium 3.6; Sodium 134  Recent Lipid Panel    Component Value Date/Time   CHOL 155 07/04/2019 0812   TRIG 116 07/04/2019 0812   HDL 45 07/04/2019 0812   CHOLHDL 3.4 07/04/2019 0812   VLDL 19 02/02/2017 0839   LDLCALC 89 07/04/2019 0812     Risk Assessment/Calculations:       Physical Exam:    VS:  BP 140/60   Pulse (!) 58   Ht 5' 10" (1.778 m)   Wt 196 lb (88.9 kg)   SpO2 97%   BMI 28.12 kg/m     Wt Readings from Last 3 Encounters:  01/15/22 196 lb (88.9 kg)  11/05/21 197 lb 6.6 oz (89.5 kg)  10/21/21 195 lb (88.5 kg)     GEN:  Well nourished, well developed in no acute distress HEENT: Normal NECK: No JVD LYMPHATICS: No lymphadenopathy CARDIAC: RRR, 3/6 SEM. No rubs, gallops RESPIRATORY:  Clear to auscultation without rales, wheezing or rhonchi  ABDOMEN: Soft, non-tender, non-distended MUSCULOSKELETAL:  No edema; No deformity  SKIN: Warm and dry NEUROLOGIC:  Alert and oriented x 3 PSYCHIATRIC:  Normal affect   ASSESSMENT:    1. Severe aortic stenosis   2. Hx of CABG   3. Essential hypertension   4. Hyperlipidemia, unspecified hyperlipidemia type   5. LBBB (left bundle branch block)    PLAN:    In order of problems listed above:  Severe aortic stenosis: echo 09/26/21 showed severe aortic stenosis with mean gradient 38.5 mmHg, peak gradient 61 mmHg, AVA 0.91 cm2 as well as mild AI. Currently undergoing TAVR work up. CTs shows suitable anatomy for a 23 mm Edwards S3UR via probable TF access. Plan for L/RHC on 01/24/22 followed by an apt with Dr. Bartle on 02/19/22.  CAD s/p CABG: no angina. Pre TAVR CT showed possible occlusion of both vein grafts with patent LIMA to LAD. Plan for L/RHC to define coronary anatomy. Continue aspirin and statin. No BB due to baseline bradycardia.   HTN: Bp  with borderline control today. No changes made given occasional orthostasis  HLD: continue statin   Sinus brady with LBBB: per Dr. Nishan, would favor an Edwards valve given membranous septal length 6.3mm with baseline sinus brady with LBBB and moderate risk for CHB    Shared Decision Making/Informed Consent The risks [stroke (1 in 1000), death (1 in 1000), kidney failure [usually temporary] (1 in 500), bleeding (1 in 200), allergic reaction [possibly serious] (1 in 200)], benefits (diagnostic support and management of coronary artery disease) and alternatives of a cardiac catheterization were discussed in detail with Mr. Dejarnett and he is willing to proceed.    Medication Adjustments/Labs and Tests Ordered: Current medicines are reviewed at length with the patient today.  Concerns regarding medicines are outlined above.  Orders Placed This Encounter  Procedures   Basic metabolic panel   CBC   EKG 12-Lead   No orders of the defined types were placed in this encounter.   Patient Instructions  Medication Instructions:  Your physician recommends that you continue on your current medications as directed. Please refer to the Current Medication list given to you today.  *If you need a refill on your cardiac medications before your next appointment, please call your pharmacy*   Lab Work: TODAY: BMET, CBC If you have labs (blood work) drawn today and your tests are completely normal, you   will receive your results only by: [place] (if you have MyChart) OR A paper copy in the mail If you have any lab test that is abnormal or we need to change your treatment, we will call you to review the results.   Testing/Procedures: Heart Cath on 01/24/22 please see instruction letter.    Follow-Up: At [facility], you and your health needs are our priority.  As part of our continuing mission to provide you with exceptional heart care, we have created designated Provider Care Teams.  These  Care Teams include your primary Cardiologist (physician) and Advanced Practice Providers (APPs -  Physician Assistants and Nurse Practitioners) who all work together to provide you with the care you need, when you need it.   Important Information About Sugar         Signed, Angelena Form, PA-C  01/15/2022 2:38 PM    [facility]

## 2022-01-14 NOTE — Progress Notes (Unsigned)
HEART AND [place]                                     Cardiology Office Note:    Date:  01/15/2022   ID:  Samuel Cook, DOB 07-Feb-1956, MRN [id number]  PCP:  Asencion Noble, MD  [facility] Cardiologist:  Jenkins Rouge, MD  [place] Electrophysiologist:  None   Referring MD: Asencion Noble, MD   Follow up aortic stenosis- set up heart cath   History of Present Illness:    Samuel Cook is a 66 y.o. male with a hx of CAD s/p 3V CABG, HTN, HLD, LBBB, and severe aortic stenosis who presents to clinic for follow up.   He underwent 3V CABG in 2018 (LIMA to LAD, SVG to OM, SVG to PDA). He has been followed by Dr. Johnsie Cancel for moderate aortic stenosis since then. Echo 09/26/21 with LVEF=55-60%, trivial MR. Severe aortic stenosis with mean gradient 38.5 mmHg, peak gradient 61 mmHg, AVA 0.91 cm2. Mild AI. He saw Dr. Johnsie Cancel 10/21/21 and reported progressive dyspnea on exertion and fatigue. Coronary CTA 10/31/21 with aortic valve calcium score of 2747. Moderate AI. Apparent occlusion of both vein grafts with patent LIMA to LAD. CTA abdomen/pelvis with calcified iliofemoral vessels with borderline access.    He was seen by Dr. Angelena Form for consideration of TAVR on 11/05/21. Plan was to let him have his poor dentition addressed and bring him back to clinic to arrange [facility].   Today the patient presents to clinic for follow up. Here with wife. He has had progressive fatigue and dyspnea. No chest pain. No LE edema, orthopnea or PND. He has occasional positional dizziness  but no syncope. No blood in stool or urine. No palpitations. He would like to be able to get more active working in the yard and chopping wood for his fire.    Past Medical History:  Diagnosis Date   Anxiety    Aortic stenosis    Coronary artery disease    a. s/p CABG in 12/2016 with LIMA-LAD, reverse SVG-OM, and reverse SVG-PDA   GERD (gastroesophageal reflux disease)    occasionally    Hyperlipidemia    Hypertension     Past Surgical History:  Procedure Laterality Date   CARDIAC CATHETERIZATION     CORONARY ARTERY BYPASS GRAFT N/A 12/26/2016   Procedure: CORONARY ARTERY BYPASS GRAFTING (CABG) x 3 WITH ENDOSCOPIC HARVESTING OF RIGHT SAPHENOUS VEIN;  Surgeon: Grace Isaac, MD;  Location: [facility];  Service: Open Heart Surgery;  Laterality: N/A;   LEFT HEART CATH AND CORONARY ANGIOGRAPHY N/A 12/12/2016   Procedure: Left Heart Cath and Coronary Angiography;  Surgeon: Belva Crome, MD;  Location: [place] CV LAB;  Service: Cardiovascular;  Laterality: N/A;   none     none   TEE WITHOUT CARDIOVERSION N/A 12/26/2016   Procedure: TRANSESOPHAGEAL ECHOCARDIOGRAM (TEE);  Surgeon: Grace Isaac, MD;  Location: [place];  Service: Open Heart Surgery;  Laterality: N/A;    Current Medications: Current Meds  Medication Sig   acetaminophen (TYLENOL) 500 MG tablet Take 1,000 mg by mouth 2 (two) times daily.   aspirin EC 81 MG tablet Take 1 tablet (81 mg total) by mouth daily.   chlorthalidone (HYGROTON) 25 MG tablet TAKE 1 TABLET(25 MG) BY MOUTH DAILY   lisinopril (ZESTRIL) 20 MG tablet TAKE 1 TABLET BY  MOUTH EVERY DAY   LORazepam (ATIVAN) 0.5 MG tablet Take 1 tablet (0.5 mg total) by mouth 2 (two) times daily as needed. for anxiety   nebivolol (BYSTOLIC) 10 MG tablet Take 10 mg by mouth daily.   rosuvastatin (CRESTOR) 40 MG tablet TAKE 1 TABLET BY MOUTH EVERY DAY     Allergies:   Norvasc [amlodipine besylate]   Social History   Socioeconomic History   Marital status: Married    Spouse name: Not on file   Number of children: 4   Years of education: Not on file   Highest education level: Not on file  Occupational History   Occupation: Retired-Sheet Engineer, manufacturing systems  Tobacco Use   Smoking status: Former    Packs/day: 1.00    Years: 20.00    Pack years: 20.00    Types: Cigarettes    Quit date: 10/20/1993    Years since quitting: 28.2   Smokeless tobacco: Current     Types: Snuff  Vaping Use   Vaping Use: Never used  Substance and Sexual Activity   Alcohol use: Not Currently    Alcohol/week: 1.0 standard drink    Types: 1 Cans of beer per week    Comment: rare    Drug use: Yes    Types: Marijuana, Codeine    Comment: occasionally....takes 1 puff when really stressed   Sexual activity: Yes  Other Topics Concern   Not on file  Social History Narrative   Not on file   Social Determinants of Health   Financial Resource Strain: Not on file  Food Insecurity: Not on file  Transportation Needs: Not on file  Physical Activity: Not on file  Stress: Not on file  Social Connections: Not on file     Family History: The patient's family history includes Birth defects in his maternal aunt; Cancer in his mother; Early death in his mother; Hearing loss in his maternal uncle; Heart disease in his maternal grandmother; Hyperlipidemia in his maternal aunt; Stroke in his father.  ROS:   Please see the history of present illness.    All other systems reviewed and are negative.  EKGs/Labs/Other Studies Reviewed:    The following studies were reviewed today:  Echo 09/26/21:  1. Left ventricular ejection fraction, by estimation, is 55 to 60%. The  left ventricle has normal function. The left ventricle has no regional  wall motion abnormalities. Left ventricular diastolic parameters were  normal.   2. Right ventricular systolic function is normal. The right ventricular  size is normal.   3. Left atrial size was mildly dilated.   4. The mitral valve is degenerative. Trivial mitral valve regurgitation.  No evidence of mitral stenosis.   5. AS more severe than 08/20/20 mean gradient 29->38.5 peak 50 -> 61.2 mmHg  AVA about the same and DVI about the same. The aortic valve is tricuspid.  There is moderate calcification of the aortic valve. There is moderate  thickening of the aortic valve.  Aortic valve regurgitation is mild. Moderate to severe aortic valve   stenosis.   6. The inferior vena cava is normal in size with greater than 50%  respiratory variability, suggesting right atrial pressure of 3 mmHg.   FINDINGS   Left Ventricle: Left ventricular ejection fraction, by estimation, is 55  to 60%. The left ventricle has normal function. The left ventricle has no  regional wall motion abnormalities. The left ventricular internal cavity  size was normal in size. There is   no left  ventricular hypertrophy. Left ventricular diastolic parameters  were normal.   Right Ventricle: The right ventricular size is normal. No increase in  right ventricular wall thickness. Right ventricular systolic function is  normal.   Left Atrium: Left atrial size was mildly dilated.   Right Atrium: Right atrial size was normal in size.   Pericardium: There is no evidence of pericardial effusion.   Mitral Valve: The mitral valve is degenerative in appearance. There is  mild thickening of the mitral valve leaflet(s). There is mild  calcification of the mitral valve leaflet(s). Mild mitral annular  calcification. Trivial mitral valve regurgitation. No  evidence of mitral valve stenosis.   Tricuspid Valve: The tricuspid valve is normal in structure. Tricuspid  valve regurgitation is not demonstrated. No evidence of tricuspid  stenosis.   Aortic Valve: AS more severe than 08/20/20 mean gradient 29->38.5 peak 50 ->  61.2 mmHg AVA about the same and DVI about the same. The aortic valve is  tricuspid. There is moderate calcification of the aortic valve. There is  moderate thickening of the aortic   valve. Aortic valve regurgitation is mild. Aortic regurgitation PHT  measures 338 msec. Moderate to severe aortic stenosis is present. Aortic  valve mean gradient measures 38.5 mmHg. Aortic valve peak gradient  measures 61.2 mmHg. Aortic valve area, by VTI   measures 0.94 cm.   Pulmonic Valve: The pulmonic valve was normal in structure. Pulmonic valve  regurgitation  is not visualized. No evidence of pulmonic stenosis.   Aorta: The aortic root is normal in size and structure.   Venous: The inferior vena cava is normal in size with greater than 50%  respiratory variability, suggesting right atrial pressure of 3 mmHg.   IAS/Shunts: No atrial level shunt detected by color flow Doppler.      LEFT VENTRICLE  PLAX 2D  LVIDd:         5.20 cm   Diastology  LVIDs:         3.40 cm   LV e' medial:    7.18 cm/s  LV PW:         1.00 cm   LV E/e' medial:  16.7  LV IVS:        1.00 cm   LV e' lateral:   7.29 cm/s  LVOT diam:     1.75 cm   LV E/e' lateral: 16.5  LV SV:         103  LV SV Index:   52  LVOT Area:     2.41 cm      RIGHT VENTRICLE  RV S prime:     9.25 cm/s  TAPSE (M-mode): 2.0 cm   LEFT ATRIUM             Index        RIGHT ATRIUM           Index  LA diam:        4.15 cm 2.11 cm/m   RA Area:     18.70 cm  LA Vol (A2C):   86.4 ml 43.84 ml/m  RA Volume:   52.70 ml  26.74 ml/m  LA Vol (A4C):   73.6 ml 37.34 ml/m  LA Biplane Vol: 79.6 ml 40.39 ml/m   AORTIC VALVE  AV Area (Vmax):    0.95 cm  AV Area (Vmean):   0.91 cm  AV Area (VTI):     0.94 cm  AV Vmax:  391.00 cm/s  AV Vmean:          295.000 cm/s  AV VTI:            1.095 m  AV Peak Grad:      61.2 mmHg  AV Mean Grad:      38.5 mmHg  LVOT Vmax:         154.00 cm/s  LVOT Vmean:        111.500 cm/s  LVOT VTI:          0.427 m  LVOT/AV VTI ratio: 0.39  AI PHT:            338 msec     AORTA  Ao Root diam: 3.00 cm   MITRAL VALVE  MV Area (PHT): 3.08 cm     SHUNTS  MV Decel Time: 246 msec     Systemic VTI:  0.43 m  MV E velocity: 120.00 cm/s  Systemic Diam: 1.75 cm  MV A velocity: 84.40 cm/s  MV E/A ratio:  1.42    ____________________  Coronary CT: March 2023 Cardiac TAVR CT   TECHNIQUE: The patient was scanned on a Siemens Force [id number] slice scanner. A 120 kV retrospective scan was triggered in the descending thoracic aorta at 111 HU's. Gantry rotation  speed was 270 msecs and collimation was .9 mm. No beta blockade or nitro were given. The 3D data set was reconstructed in 5% intervals of the R-R cycle. Systolic and diastolic phases were analyzed on a dedicated work station using MPR, MIP and VRT modes. The patient received 80 cc of contrast.   FINDINGS: Aortic Valve: Calcified tri leaflet with restricted leaflet motion Calcium score 2747   Aorta: No aneurysm normal arch vessels moderate calcific atherosclerosis   Sinotubular Junction: 26 mm   Ascending Thoracic Aorta: 34 mm   Aortic Arch: 26 mm   Descending Thoracic Aorta: 25 mm   Sinus of Valsalva Measurements:   Non-coronary: 29.2 mm   Right - coronary: 28 mm Sinus height 19 mm   Left - coronary: 29 mm  Sinus height 21 mm   Coronary Artery Height above Annulus:   Left Main: 13.6 mm above annulus   Right Coronary: 12.5 mm above annulus   Virtual Basal Annulus Measurements:   Maximum/Minimum Diameter: 22.2 mm x 26.2 mm   Perimeter: 75.2 mm   Area: 416.5 mm2   Coronary Arteries: Sufficient height above annulus for deployment The SVG to PDA and SVG to OM are occluded The LIMA to LAD is patent   Optimum Fluoroscopic Angle for Delivery: LAO 10 Caudal 6 degrees   Membranous septal length 6.3 mm   IMPRESSION: 1. Tri leaflet calcified AV with score 2747. Also central area of mal coaptation with moderate AR on TTE   2. Coronary arteries sufficient height above annulus for deployment Occluded SVG to RCA and OM. Patent LIMA to LAD   3. Annular area of 416.5 mm 2 suitable for a 23 mm Sapien 3 valve The right sinus diameter is a bit small for a 29 mm Evolute Pro valve   4. Membranous septal length 6.3 mm with baseline ECG LBBB moderate risk for CHB would also favor Sapien valve   5. Coronary arteries sufficient height above annulus for deployment Patent LIMA to LAD The SVGls to PDA and OM appear occluded   6. Optimum angiographic angle for deployment LAO 10  Caudal 6 degrees    EKG:  EKG is ordered today.  The ekg ordered  today demonstrates sinus brady with LBBB HR 58 bpm  Recent Labs: 10/21/2021: BUN 22; Creatinine, Ser 1.10; Potassium 3.6; Sodium 134  Recent Lipid Panel    Component Value Date/Time   CHOL 155 07/04/2019 0812   TRIG 116 07/04/2019 0812   HDL 45 07/04/2019 0812   CHOLHDL 3.4 07/04/2019 0812   VLDL 19 02/02/2017 0839   LDLCALC 89 07/04/2019 0812     Risk Assessment/Calculations:       Physical Exam:    VS:  BP 140/60   Pulse (!) 58   Ht 5\' 10"  (1.778 m)   Wt 196 lb (88.9 kg)   SpO2 97%   BMI 28.12 kg/m     Wt Readings from Last 3 Encounters:  01/15/22 196 lb (88.9 kg)  11/05/21 197 lb 6.6 oz (89.5 kg)  10/21/21 195 lb (88.5 kg)     GEN:  Well nourished, well developed in no acute distress HEENT: Normal NECK: No JVD LYMPHATICS: No lymphadenopathy CARDIAC: RRR, 3/6 SEM. No rubs, gallops RESPIRATORY:  Clear to auscultation without rales, wheezing or rhonchi  ABDOMEN: Soft, non-tender, non-distended MUSCULOSKELETAL:  No edema; No deformity  SKIN: Warm and dry NEUROLOGIC:  Alert and oriented x 3 PSYCHIATRIC:  Normal affect   ASSESSMENT:    1. Severe aortic stenosis   2. Hx of CABG   3. Essential hypertension   4. Hyperlipidemia, unspecified hyperlipidemia type   5. LBBB (left bundle branch block)    PLAN:    In order of problems listed above:  Severe aortic stenosis: echo 09/26/21 showed severe aortic stenosis with mean gradient 38.5 mmHg, peak gradient 61 mmHg, AVA 0.91 cm2 as well as mild AI. Currently undergoing TAVR work up. CTs shows suitable anatomy for a 23 mm Edwards S3UR via probable TF access. Plan for [facility] on 01/24/22 followed by an apt with Dr. 03/26/22 on 02/19/22.  CAD s/p CABG: no angina. Pre TAVR CT showed possible occlusion of both vein grafts with patent LIMA to LAD. Plan for [facility] to define coronary anatomy. Continue aspirin and statin. No BB due to baseline bradycardia.   HTN: Bp  with borderline control today. No changes made given occasional orthostasis  HLD: continue statin   Sinus brady with LBBB: per Dr[facility], would favor an Edwards valve given membranous septal length 6.48mm with baseline sinus brady with LBBB and moderate risk for CHB    Shared Decision Making/Informed Consent The risks [stroke (1 in 1000), death (1 in 1000), kidney failure [usually temporary] (1 in 500), bleeding (1 in 200), allergic reaction [possibly serious] (1 in 200)], benefits (diagnostic support and management of coronary artery disease) and alternatives of a cardiac catheterization were discussed in detail with Mr. Larke and he is willing to proceed.    Medication Adjustments/Labs and Tests Ordered: Current medicines are reviewed at length with the patient today.  Concerns regarding medicines are outlined above.  Orders Placed This Encounter  Procedures   Basic metabolic panel   CBC   EKG 12-Lead   No orders of the defined types were placed in this encounter.   Patient Instructions  Medication Instructions:  Your physician recommends that you continue on your current medications as directed. Please refer to the Current Medication list given to you today.  *If you need a refill on your cardiac medications before your next appointment, please call your pharmacy*   Lab Work: TODAY: BMET, CBC If you have labs (blood work) drawn today and your tests are completely normal, you  will receive your results only by: [place] (if you have MyChart) OR A paper copy in the mail If you have any lab test that is abnormal or we need to change your treatment, we will call you to review the results.   Testing/Procedures: Heart Cath on 01/24/22 please see instruction letter.    Follow-Up: At [facility], you and your health needs are our priority.  As part of our continuing mission to provide you with exceptional heart care, we have created designated Provider Care Teams.  These  Care Teams include your primary Cardiologist (physician) and Advanced Practice Providers (APPs -  Physician Assistants and Nurse Practitioners) who all work together to provide you with the care you need, when you need it.   Important Information About Sugar         Signed, Angelena Form, PA-C  01/15/2022 2:38 PM    [facility]

## 2022-01-15 ENCOUNTER — Ambulatory Visit: Payer: Medicare HMO | Admitting: Physician Assistant

## 2022-01-15 VITALS — BP 140/60 | HR 58 | Ht 70.0 in | Wt 196.0 lb

## 2022-01-15 DIAGNOSIS — I10 Essential (primary) hypertension: Secondary | ICD-10-CM

## 2022-01-15 DIAGNOSIS — I350 Nonrheumatic aortic (valve) stenosis: Secondary | ICD-10-CM

## 2022-01-15 DIAGNOSIS — E785 Hyperlipidemia, unspecified: Secondary | ICD-10-CM | POA: Diagnosis not present

## 2022-01-15 DIAGNOSIS — Z951 Presence of aortocoronary bypass graft: Secondary | ICD-10-CM

## 2022-01-15 DIAGNOSIS — I447 Left bundle-branch block, unspecified: Secondary | ICD-10-CM

## 2022-01-15 NOTE — Patient Instructions (Signed)
Medication Instructions:  Your physician recommends that you continue on your current medications as directed. Please refer to the Current Medication list given to you today.  *If you need a refill on your cardiac medications before your next appointment, please call your pharmacy*   Lab Work: TODAY: BMET, CBC If you have labs (blood work) drawn today and your tests are completely normal, you will receive your results only by: [place] (if you have MyChart) OR A paper copy in the mail If you have any lab test that is abnormal or we need to change your treatment, we will call you to review the results.   Testing/Procedures: Heart Cath on 01/24/22 please see instruction letter.    Follow-Up: At [facility], you and your health needs are our priority.  As part of our continuing mission to provide you with exceptional heart care, we have created designated Provider Care Teams.  These Care Teams include your primary Cardiologist (physician) and Advanced Practice Providers (APPs -  Physician Assistants and Nurse Practitioners) who all work together to provide you with the care you need, when you need it.   Important Information About Sugar

## 2022-01-15 NOTE — Progress Notes (Signed)
Pre Surgical Assessment: 5 M Walk Test  62M=16.33ft  5 Meter Walk Test- trial 1: 5.60 seconds 5 Meter Walk Test- trial 2: 5.17 seconds 5 Meter Walk Test- trial 3: 4.86 seconds 5 Meter Walk Test Average: 5.21 seconds

## 2022-01-16 LAB — BASIC METABOLIC PANEL
BUN/Creatinine Ratio: 18 (ref 10–24)
BUN: 20 mg/dL (ref 8–27)
CO2: 24 mmol/L (ref 20–29)
Calcium: 9.7 mg/dL (ref 8.6–10.2)
Chloride: 97 mmol/L (ref 96–106)
Creatinine, Ser: 1.11 mg/dL (ref 0.76–1.27)
Glucose: 104 mg/dL — ABNORMAL HIGH (ref 70–99)
Potassium: 4.4 mmol/L (ref 3.5–5.2)
Sodium: 135 mmol/L (ref 134–144)
eGFR: 74 mL/min/{1.73_m2} (ref >59)

## 2022-01-16 LAB — CBC
Hematocrit: 48.8 % (ref 37.5–51.0)
Hemoglobin: 17.4 g/dL (ref 13.0–17.7)
MCH: 34.7 pg — ABNORMAL HIGH (ref 26.6–33.0)
MCHC: 35.7 g/dL (ref 31.5–35.7)
MCV: 97 fL (ref 79–97)
Platelets: 268 10*3/uL (ref 150–450)
RBC: 5.02 x10E6/uL (ref 4.14–5.80)
RDW: 12.2 % (ref 11.6–15.4)
WBC: 10.8 10*3/uL (ref 3.4–10.8)

## 2022-01-23 ENCOUNTER — Telehealth: Payer: Self-pay | Admitting: *Deleted

## 2022-01-23 NOTE — Telephone Encounter (Addendum)
Cardiac Catheterization scheduled at [facility] for: Friday January 24, 2022 7:30 AM Arrival time and place: [facility] Main Entrance A at: 5:30 AM   Nothing to eat after midnight prior to procedure, clear liquids until 5 AM day of procedure.  Medication instructions: -Hold:  Chlorthalidone-AM of procedure -Except hold medications usual morning medications can be taken with sips of water including aspirin 81 mg.  Confirmed patient has responsible adult to drive home post procedure and be with patient first 24 hours after arriving home.  Patient reports no new symptoms concerning for COVID-19/no exposure to COVID-19 in the past 10 days.  Reviewed procedure instructions with patient.

## 2022-01-24 ENCOUNTER — Encounter (HOSPITAL_COMMUNITY)
Admission: RE | Disposition: A | Discharge status: Home / Self Care | Payer: Self-pay | Source: Ambulatory Visit | Attending: Cardiovascular Disease

## 2022-01-24 ENCOUNTER — Encounter (HOSPITAL_COMMUNITY): Payer: Self-pay | Admitting: Cardiovascular Disease

## 2022-01-24 ENCOUNTER — Ambulatory Visit (HOSPITAL_COMMUNITY)
Admission: RE | Admit: 2022-01-24 | Discharge: 2022-01-24 | Disposition: A | Discharge status: Home / Self Care | Payer: Medicare HMO | Source: Ambulatory Visit | Attending: Cardiovascular Disease | Admitting: Cardiovascular Disease

## 2022-01-24 ENCOUNTER — Other Ambulatory Visit: Payer: Self-pay

## 2022-01-24 DIAGNOSIS — I447 Left bundle-branch block, unspecified: Secondary | ICD-10-CM | POA: Diagnosis not present

## 2022-01-24 DIAGNOSIS — I2584 Coronary atherosclerosis due to calcified coronary lesion: Secondary | ICD-10-CM | POA: Insufficient documentation

## 2022-01-24 DIAGNOSIS — E785 Hyperlipidemia, unspecified: Secondary | ICD-10-CM | POA: Diagnosis not present

## 2022-01-24 DIAGNOSIS — R0609 Other forms of dyspnea: Secondary | ICD-10-CM | POA: Diagnosis not present

## 2022-01-24 DIAGNOSIS — I2582 Chronic total occlusion of coronary artery: Secondary | ICD-10-CM | POA: Insufficient documentation

## 2022-01-24 DIAGNOSIS — I25110 Atherosclerotic heart disease of native coronary artery with unstable angina pectoris: Secondary | ICD-10-CM | POA: Diagnosis not present

## 2022-01-24 DIAGNOSIS — I10 Essential (primary) hypertension: Secondary | ICD-10-CM | POA: Diagnosis not present

## 2022-01-24 DIAGNOSIS — I200 Unstable angina: Secondary | ICD-10-CM | POA: Diagnosis not present

## 2022-01-24 DIAGNOSIS — I350 Nonrheumatic aortic (valve) stenosis: Secondary | ICD-10-CM | POA: Diagnosis not present

## 2022-01-24 DIAGNOSIS — I25700 Atherosclerosis of coronary artery bypass graft(s), unspecified, with unstable angina pectoris: Secondary | ICD-10-CM | POA: Insufficient documentation

## 2022-01-24 HISTORY — PX: RIGHT/LEFT HEART CATH AND CORONARY/GRAFT ANGIOGRAPHY: CATH118267

## 2022-01-24 LAB — POCT I-STAT 7, (LYTES, BLD GAS, ICA,H+H)
Acid-Base Excess: 1 mmol/L (ref 0.0–2.0)
Bicarbonate: 27.3 mmol/L (ref 20.0–28.0)
Calcium, Ion: 1.27 mmol/L (ref 1.15–1.40)
HCT: 47 % (ref 39.0–52.0)
Hemoglobin: 16 g/dL (ref 13.0–17.0)
O2 Saturation: 92 %
Potassium: 3.6 mmol/L (ref 3.5–5.1)
Sodium: 135 mmol/L (ref 135–145)
TCO2: 29 mmol/L (ref 22–32)
pCO2 arterial: 46 mmHg (ref 32–48)
pH, Arterial: 7.381 (ref 7.35–7.45)
pO2, Arterial: 66 mmHg — ABNORMAL LOW (ref 83–108)

## 2022-01-24 LAB — POCT I-STAT EG7
Acid-Base Excess: 2 mmol/L (ref 0.0–2.0)
Bicarbonate: 27.8 mmol/L (ref 20.0–28.0)
Calcium, Ion: 1.22 mmol/L (ref 1.15–1.40)
HCT: 48 % (ref 39.0–52.0)
Hemoglobin: 16.3 g/dL (ref 13.0–17.0)
O2 Saturation: 71 %
Potassium: 3.6 mmol/L (ref 3.5–5.1)
Sodium: 140 mmol/L (ref 135–145)
TCO2: 29 mmol/L (ref 22–32)
pCO2, Ven: 46.8 mmHg (ref 44–60)
pH, Ven: 7.382 (ref 7.25–7.43)
pO2, Ven: 38 mmHg (ref 32–45)

## 2022-01-24 SURGERY — RIGHT/LEFT HEART CATH AND CORONARY/GRAFT ANGIOGRAPHY
Anesthesia: LOCAL

## 2022-01-24 MED ORDER — MIDAZOLAM HCL 2 MG/2ML IJ SOLN
INTRAMUSCULAR | Status: AC
  Filled 2022-01-24: qty 2

## 2022-01-24 MED ORDER — SODIUM CHLORIDE 0.9% FLUSH
3.0000 mL | INTRAVENOUS | Status: DC | PRN
Start: 2022-01-24 — End: 2022-01-24

## 2022-01-24 MED ORDER — FENTANYL CITRATE (PF) 100 MCG/2ML IJ SOLN
INTRAMUSCULAR | Status: AC
  Filled 2022-01-24: qty 2

## 2022-01-24 MED ORDER — HYDRALAZINE HCL 20 MG/ML IJ SOLN: 10.0000 mg | INTRAMUSCULAR | Status: DC | PRN

## 2022-01-24 MED ORDER — SODIUM CHLORIDE 0.9% FLUSH: 3.0000 mL | INTRAVENOUS | Status: DC | PRN

## 2022-01-24 MED ORDER — LABETALOL HCL 5 MG/ML IV SOLN: 10.0000 mg | INTRAVENOUS | Status: DC | PRN

## 2022-01-24 MED ORDER — SODIUM CHLORIDE 0.9 % IV SOLN
250.0000 mL | INTRAVENOUS | Status: DC | PRN
Start: 2022-01-24 — End: 2022-01-24

## 2022-01-24 MED ORDER — ONDANSETRON HCL 4 MG/2ML IJ SOLN: 4.0000 mg | Freq: Four times a day (QID) | INTRAMUSCULAR | Status: DC | PRN

## 2022-01-24 MED ORDER — SODIUM CHLORIDE 0.9 % WEIGHT BASED INFUSION: 1.0000 mL/kg/h | INTRAVENOUS | Status: DC

## 2022-01-24 MED ORDER — FENTANYL CITRATE (PF) 100 MCG/2ML IJ SOLN
INTRAMUSCULAR | Status: DC | PRN
  Administered 2022-01-24: 50 ug via INTRAVENOUS

## 2022-01-24 MED ORDER — HEPARIN SODIUM (PORCINE) 1000 UNIT/ML IJ SOLN
INTRAMUSCULAR | Status: AC
  Filled 2022-01-24: qty 10

## 2022-01-24 MED ORDER — SODIUM CHLORIDE 0.9 % IV SOLN: INTRAVENOUS | Status: AC

## 2022-01-24 MED ORDER — VERAPAMIL HCL 2.5 MG/ML IV SOLN
INTRAVENOUS | Status: AC
  Filled 2022-01-24: qty 2

## 2022-01-24 MED ORDER — LIDOCAINE HCL (PF) 1 % IJ SOLN
INTRAMUSCULAR | Status: AC
  Filled 2022-01-24: qty 30

## 2022-01-24 MED ORDER — MIDAZOLAM HCL 2 MG/2ML IJ SOLN
INTRAMUSCULAR | Status: DC | PRN
  Administered 2022-01-24: 2 mg via INTRAVENOUS

## 2022-01-24 MED ORDER — VERAPAMIL HCL 2.5 MG/ML IV SOLN
INTRAVENOUS | Status: DC | PRN
  Administered 2022-01-24: 10 mL via INTRA_ARTERIAL

## 2022-01-24 MED ORDER — SODIUM CHLORIDE 0.9 % IV SOLN: 250.0000 mL | INTRAVENOUS | Status: DC | PRN

## 2022-01-24 MED ORDER — SODIUM CHLORIDE 0.9% FLUSH
3.0000 mL | Freq: Two times a day (BID) | INTRAVENOUS | Status: DC
Start: 2022-01-24 — End: 2022-01-24

## 2022-01-24 MED ORDER — ASPIRIN 81 MG PO CHEW: 81.0000 mg | CHEWABLE_TABLET | ORAL | Status: AC

## 2022-01-24 MED ORDER — HEPARIN SODIUM (PORCINE) 1000 UNIT/ML IJ SOLN
INTRAMUSCULAR | Status: DC | PRN
  Administered 2022-01-24: 4000 [IU] via INTRAVENOUS

## 2022-01-24 MED ORDER — IOHEXOL 350 MG/ML SOLN
INTRAVENOUS | Status: DC | PRN
  Administered 2022-01-24: 105 mL

## 2022-01-24 MED ORDER — HEPARIN (PORCINE) IN NACL 1000-0.9 UT/500ML-% IV SOLN
INTRAVENOUS | Status: AC
  Filled 2022-01-24: qty 1000

## 2022-01-24 MED ORDER — SODIUM CHLORIDE 0.9 % WEIGHT BASED INFUSION
3.0000 mL/kg/h | INTRAVENOUS | Status: AC
  Administered 2022-01-24: 3 mL/kg/h via INTRAVENOUS

## 2022-01-24 MED ORDER — HEPARIN (PORCINE) IN NACL 1000-0.9 UT/500ML-% IV SOLN
INTRAVENOUS | Status: DC | PRN
  Administered 2022-01-24 (×2): 500 mL

## 2022-01-24 MED ORDER — SODIUM CHLORIDE 0.9% FLUSH: 3.0000 mL | Freq: Two times a day (BID) | INTRAVENOUS | Status: DC

## 2022-01-24 MED ORDER — LIDOCAINE HCL (PF) 1 % IJ SOLN
INTRAMUSCULAR | Status: DC | PRN
  Administered 2022-01-24 (×2): 2 mL

## 2022-01-24 MED ORDER — ACETAMINOPHEN 325 MG PO TABS: 650.0000 mg | ORAL_TABLET | ORAL | Status: DC | PRN

## 2022-01-24 SURGICAL SUPPLY — 16 items
BAND CMPR LRG ZPHR (HEMOSTASIS) ×1
BAND ZEPHYR COMPRESS 30 LONG (HEMOSTASIS) ×1 IMPLANT
CATH BALLN WEDGE 5F 110CM (CATHETERS) ×1 IMPLANT
CATH INFINITI 5 FR IM (CATHETERS) ×1 IMPLANT
CATH INFINITI 5 FR LCB (CATHETERS) ×1 IMPLANT
CATH INFINITI 5FR MULTPACK ANG (CATHETERS) ×1 IMPLANT
ELECT DEFIB PAD ADLT CADENCE (PAD) ×1 IMPLANT
GLIDESHEATH SLEND SS 6F .021 (SHEATH) ×1 IMPLANT
GUIDEWIRE .025 260CM (WIRE) ×1 IMPLANT
GUIDEWIRE INQWIRE 1.5J.035X260 (WIRE) IMPLANT
INQWIRE 1.5J .035X260CM (WIRE) ×2
KIT HEART LEFT (KITS) ×3 IMPLANT
PACK CARDIAC CATHETERIZATION (CUSTOM PROCEDURE TRAY) ×3 IMPLANT
SHEATH GLIDE SLENDER 4/5FR (SHEATH) ×1 IMPLANT
TRANSDUCER W/STOPCOCK (MISCELLANEOUS) ×4 IMPLANT
TUBING CIL FLEX 10 FLL-RA (TUBING) ×3 IMPLANT

## 2022-01-24 NOTE — Interval H&P Note (Signed)
History and Physical Interval Note:  01/24/2022 6:52 AM  Samuel Cook  has presented today for surgery, with the diagnosis of aortic stenosis.  The various methods of treatment have been discussed with the patient and family. After consideration of risks, benefits and other options for treatment, the patient has consented to  Procedure(s): RIGHT/LEFT HEART CATH AND CORONARY/GRAFT ANGIOGRAPHY (N/A) as a surgical intervention.  The patient's history has been reviewed, patient examined, no change in status, stable for surgery.  I have reviewed the patient's chart and labs.  Questions were answered to the patient's satisfaction.    Cath Lab Visit (complete for each Cath Lab visit)  Clinical Evaluation Leading to the Procedure:   ACS: No.  Non-ACS:    Anginal Classification: CCS II  Anti-ischemic medical therapy: Minimal Therapy (1 class of medications)  Non-Invasive Test Results: No non-invasive testing performed  Prior CABG: Previous CABG        Verne Carrow

## 2022-02-19 ENCOUNTER — Other Ambulatory Visit: Payer: Self-pay | Admitting: *Deleted

## 2022-02-19 ENCOUNTER — Institutional Professional Consult (permissible substitution): Payer: Medicare HMO | Admitting: Surgery

## 2022-02-19 ENCOUNTER — Encounter: Payer: Self-pay | Admitting: *Deleted

## 2022-02-19 VITALS — BP 118/56 | HR 60 | Resp 20 | Ht 70.0 in | Wt 180.0 lb

## 2022-02-19 DIAGNOSIS — I2510 Atherosclerotic heart disease of native coronary artery without angina pectoris: Secondary | ICD-10-CM

## 2022-02-19 DIAGNOSIS — I350 Nonrheumatic aortic (valve) stenosis: Secondary | ICD-10-CM

## 2022-02-19 DIAGNOSIS — Z951 Presence of aortocoronary bypass graft: Secondary | ICD-10-CM

## 2022-02-20 NOTE — Progress Notes (Signed)
Cardiothoracic Surgery Consultation   PCP is Asencion Noble, MD Referring Provider is Josue Hector, MD  Chief Complaint  Patient presents with   Coronary Artery Disease    Surgical consult, HX of CABG 12/2016, Cardiac Cath 01/24/22    HPI:  The patient is a 66 year old gentleman with a history of hypertension, hyperlipidemia, coronary artery disease status post CABG x3 by Dr. Servando Snare in 12/2016 (LIMA to LAD, SVG to OM, SVG to PDA), and aortic stenosis that has been followed by Dr. Johnsie Cancel.  An echocardiogram in January 2022 showed an increase in the mean gradient from 19 mm year before to 30 mmHg with a valve area of 1.1 cm.  There was mild aortic insufficiency.  His most recent echo on 09/26/2021 showed further increase in the mean gradient to 38.5 mmHg with peak gradient of 61.2 mmHg.  Aortic valve area was 0.94 cm.  There was mild aortic insufficiency.  Left ventricular ejection fraction remains 55 to 60%.  He subsequent underwent a gated cardiac CTA which showed a severely calcified trileaflet aortic valve with restricted leaflet mobility.  The ascending aorta was measured at 3.4 cm with the sinotubular junction of 2.6 cm.  Cardiac catheterization was performed on 01/24/2022 and showed a patent left internal mammary graft to the LAD.  There is severe proximal LAD stenosis.  The mid and distal LAD and diagonal branch filled by the LIMA graft.  There was a severe calcified stenosis at the ostium of a large dominant left circumflex involving the ostium of a moderate-sized intermediate branch.  There is severe ostial stenosis of a moderate caliber obtuse marginal branch.  The vein grafts to the obtuse marginal branch and the PDA off the left circumflex were occluded.  Right heart pressures were normal.  The patient is here today with his wife.  He denies any chest discomfort but has been having exertional shortness of breath and fatigue with occasional episodes of positional dizziness.  He denies any  peripheral edema.  He has had no orthopnea or PND.  Past Medical History:  Diagnosis Date   Anxiety    Aortic stenosis    Coronary artery disease    a. s/p CABG in 12/2016 with LIMA-LAD, reverse SVG-OM, and reverse SVG-PDA   GERD (gastroesophageal reflux disease)    occasionally   Hyperlipidemia    Hypertension     Past Surgical History:  Procedure Laterality Date   CARDIAC CATHETERIZATION     CORONARY ARTERY BYPASS GRAFT N/A 12/26/2016   Procedure: CORONARY ARTERY BYPASS GRAFTING (CABG) x 3 WITH ENDOSCOPIC HARVESTING OF RIGHT SAPHENOUS VEIN;  Surgeon: Grace Isaac, MD;  Location: [facility];  Service: Open Heart Surgery;  Laterality: N/A;   LEFT HEART CATH AND CORONARY ANGIOGRAPHY N/A 12/12/2016   Procedure: Left Heart Cath and Coronary Angiography;  Surgeon: Belva Crome, MD;  Location: [facility];  Service: Cardiovascular;  Laterality: N/A;   none     none   RIGHT/LEFT HEART CATH AND CORONARY/GRAFT ANGIOGRAPHY N/A 01/24/2022   Procedure: RIGHT/LEFT HEART CATH AND CORONARY/GRAFT ANGIOGRAPHY;  Surgeon: Burnell Blanks, MD;  Location: [facility];  Service: Cardiovascular;  Laterality: N/A;   TEE WITHOUT CARDIOVERSION N/A 12/26/2016   Procedure: TRANSESOPHAGEAL ECHOCARDIOGRAM (TEE);  Surgeon: Grace Isaac, MD;  Location: [facility];  Service: Open Heart Surgery;  Laterality: N/A;    Family History  Problem Relation Age of Onset   Cancer Mother        Lung  cancer   Early death Mother    Stroke Father    Heart disease Maternal Grandmother    Birth defects Maternal Aunt    Hyperlipidemia Maternal Aunt    Hearing loss Maternal Uncle     Social History Social History   Tobacco Use   Smoking status: Former    Packs/day: 1.00    Years: 20.00    Total pack years: 20.00    Types: Cigarettes    Quit date: 10/20/1993    Years since quitting: 28.3   Smokeless tobacco: Current    Types: Snuff  Vaping Use   Vaping Use: Never used  Substance Use Topics    Alcohol use: Not Currently    Alcohol/week: 1.0 standard drink of alcohol    Types: 1 Cans of beer per week    Comment: rare    Drug use: Yes    Types: Marijuana, Codeine    Comment: occasionally....takes 1 puff when really stressed    Current Outpatient Medications  Medication Sig Dispense Refill   acetaminophen (TYLENOL) 500 MG tablet Take 1,000 mg by mouth 2 (two) times daily.     amLODipine (NORVASC) 5 MG tablet Take 5 mg by mouth at bedtime.     aspirin EC 81 MG tablet Take 1 tablet (81 mg total) by mouth daily. 90 tablet 3   chlorthalidone (HYGROTON) 25 MG tablet TAKE 1 TABLET(25 MG) BY MOUTH DAILY 30 tablet 0   escitalopram (LEXAPRO) 10 MG tablet Take 10 mg by mouth at bedtime.     lisinopril (ZESTRIL) 20 MG tablet TAKE 1 TABLET BY MOUTH EVERY DAY 90 tablet 3   LORazepam (ATIVAN) 0.5 MG tablet Take 1 tablet (0.5 mg total) by mouth 2 (two) times daily as needed. for anxiety (Patient taking differently: Take 0.5 mg by mouth daily.) 60 tablet 0   nebivolol (BYSTOLIC) 10 MG tablet Take 10 mg by mouth daily.     rosuvastatin (CRESTOR) 40 MG tablet TAKE 1 TABLET BY MOUTH EVERY DAY 90 tablet 0   No current facility-administered medications for this visit.    No Known Allergies  Review of Systems  Constitutional:  Positive for fatigue.  HENT: Negative.         Recently had some teeth extracted in preparation for surgery.  Eyes: Negative.   Respiratory:  Positive for shortness of breath.   Cardiovascular:  Negative for chest pain, palpitations and leg swelling.  Gastrointestinal: Negative.   Endocrine: Negative.   Genitourinary: Negative.   Musculoskeletal: Negative.   Allergic/Immunologic: Negative.   Neurological:  Negative for dizziness and syncope.  Hematological: Negative.   Psychiatric/Behavioral: Negative.      BP (!) 118/56   Pulse 60   Resp 20   Ht 5\' 10"  (1.778 m)   Wt 180 lb (81.6 kg)   SpO2 95% Comment: RA  BMI 25.83 kg/m  Physical Exam Constitutional:       Appearance: Normal appearance. He is normal weight.  HENT:     Head: Normocephalic and atraumatic.     Mouth/Throat:     Mouth: Mucous membranes are moist.     Pharynx: Oropharynx is clear.  Eyes:     Extraocular Movements: Extraocular movements intact.     Conjunctiva/sclera: Conjunctivae normal.     Pupils: Pupils are equal, round, and reactive to light.  Neck:     Vascular: No carotid bruit.  Cardiovascular:     Rate and Rhythm: Normal rate and regular rhythm.     Pulses:  Normal pulses.     Heart sounds: Murmur heard.     Comments: 3/6 systolic murmur right sternal border.  There is no diastolic murmur. Pulmonary:     Effort: Pulmonary effort is normal.     Breath sounds: Normal breath sounds.  Abdominal:     General: There is no distension.     Tenderness: There is no abdominal tenderness.  Musculoskeletal:        General: No swelling. Normal range of motion.     Cervical back: Normal range of motion and neck supple.  Skin:    General: Skin is warm and dry.  Neurological:     General: No focal deficit present.     Mental Status: He is alert and oriented to person, place, and time.  Psychiatric:        Mood and Affect: Mood normal.        Behavior: Behavior normal.     Diagnostic Tests:  ECHOCARDIOGRAM REPORT         Patient Name:   Samuel Cook Date of Exam: 09/26/2021  Medical Rec #:  [id number]    Height:       69.0 in  Accession #:    [id number]   Weight:       179.0 lb  Date of Birth:  03/29/1956   BSA:          1.971 m  Patient Age:    32 years     BP:           175/58 mmHg  Patient Gender: M            HR:           63 bpm.  Exam Location:  [facility]   Procedure: 2D Echo, Cardiac Doppler and Color Doppler   Indications:    I35.0 (ICD-10-CM) - Nonrheumatic aortic valve stenosis     History:        Patient has prior history of Echocardiogram examinations,  most                  recent 08/20/2020. CAD, Prior CABG, Aortic Valve Disease;  Risk                   Factors:Hypertension, Dyslipidemia and Former Smoker.     Sonographer:    Alvino Chapel RCS  Referring Phys: [place]     1. Left ventricular ejection fraction, by estimation, is 55 to 60%. The  left ventricle has normal function. The left ventricle has no regional  wall motion abnormalities. Left ventricular diastolic parameters were  normal.   2. Right ventricular systolic function is normal. The right ventricular  size is normal.   3. Left atrial size was mildly dilated.   4. The mitral valve is degenerative. Trivial mitral valve regurgitation.  No evidence of mitral stenosis.   5. AS more severe than 08/20/20 mean gradient 29->38.5 peak 50 -> 61.2 mmHg  AVA about the same and DVI about the same. The aortic valve is tricuspid.  There is moderate calcification of the aortic valve. There is moderate  thickening of the aortic valve.  Aortic valve regurgitation is mild. Moderate to severe aortic valve  stenosis.   6. The inferior vena cava is normal in size with greater than 50%  respiratory variability, suggesting right atrial pressure of 3 mmHg.   FINDINGS   Left Ventricle: Left ventricular ejection fraction, by estimation, is 55  to 60%. The left ventricle has normal function. The left ventricle has no  regional wall motion abnormalities. The left ventricular internal cavity  size was normal in size. There is   no left ventricular hypertrophy. Left ventricular diastolic parameters  were normal.   Right Ventricle: The right ventricular size is normal. No increase in  right ventricular wall thickness. Right ventricular systolic function is  normal.   Left Atrium: Left atrial size was mildly dilated.   Right Atrium: Right atrial size was normal in size.   Pericardium: There is no evidence of pericardial effusion.   Mitral Valve: The mitral valve is degenerative in appearance. There is  mild thickening of the mitral valve leaflet(s). There is  mild  calcification of the mitral valve leaflet(s). Mild mitral annular  calcification. Trivial mitral valve regurgitation. No  evidence of mitral valve stenosis.   Tricuspid Valve: The tricuspid valve is normal in structure. Tricuspid  valve regurgitation is not demonstrated. No evidence of tricuspid  stenosis.   Aortic Valve: AS more severe than 08/20/20 mean gradient 29->38.5 peak 50 ->  61.2 mmHg AVA about the same and DVI about the same. The aortic valve is  tricuspid. There is moderate calcification of the aortic valve. There is  moderate thickening of the aortic   valve. Aortic valve regurgitation is mild. Aortic regurgitation PHT  measures 338 msec. Moderate to severe aortic stenosis is present. Aortic  valve mean gradient measures 38.5 mmHg. Aortic valve peak gradient  measures 61.2 mmHg. Aortic valve area, by VTI   measures 0.94 cm.   Pulmonic Valve: The pulmonic valve was normal in structure. Pulmonic valve  regurgitation is not visualized. No evidence of pulmonic stenosis.   Aorta: The aortic root is normal in size and structure.   Venous: The inferior vena cava is normal in size with greater than 50%  respiratory variability, suggesting right atrial pressure of 3 mmHg.   IAS/Shunts: No atrial level shunt detected by color flow Doppler.      LEFT VENTRICLE  PLAX 2D  LVIDd:         5.20 cm   Diastology  LVIDs:         3.40 cm   LV e' medial:    7.18 cm/s  LV PW:         1.00 cm   LV E/e' medial:  16.7  LV IVS:        1.00 cm   LV e' lateral:   7.29 cm/s  LVOT diam:     1.75 cm   LV E/e' lateral: 16.5  LV SV:         103  LV SV Index:   52  LVOT Area:     2.41 cm      RIGHT VENTRICLE  RV S prime:     9.25 cm/s  TAPSE (M-mode): 2.0 cm   LEFT ATRIUM             Index        RIGHT ATRIUM           Index  LA diam:        4.15 cm 2.11 cm/m   RA Area:     18.70 cm  LA Vol (A2C):   86.4 ml 43.84 ml/m  RA Volume:   52.70 ml  26.74 ml/m  LA Vol (A4C):   73.6 ml  37.34 ml/m  LA Biplane Vol: 79.6 ml 40.39 ml/m   AORTIC VALVE  AV  Area (Vmax):    0.95 cm  AV Area (Vmean):   0.91 cm  AV Area (VTI):     0.94 cm  AV Vmax:           391.00 cm/s  AV Vmean:          295.000 cm/s  AV VTI:            1.095 m  AV Peak Grad:      61.2 mmHg  AV Mean Grad:      38.5 mmHg  LVOT Vmax:         154.00 cm/s  LVOT Vmean:        111.500 cm/s  LVOT VTI:          0.427 m  LVOT/AV VTI ratio: 0.39  AI PHT:            338 msec     AORTA  Ao Root diam: 3.00 cm   MITRAL VALVE  MV Area (PHT): 3.08 cm     SHUNTS  MV Decel Time: 246 msec     Systemic VTI:  0.43 m  MV E velocity: 120.00 cm/s  Systemic Diam: 1.75 cm  MV A velocity: 84.40 cm/s  MV E/A ratio:  1.42   Charlton Haws MD  Electronically signed by Charlton Haws MD  Signature Date/Time: 09/26/2021/3:58:50 PM      Physicians  Panel Physicians Referring Physician Case Authorizing Physician  Kathleene Hazel, MD (Primary)     Procedures  RIGHT/LEFT HEART CATH AND CORONARY/GRAFT ANGIOGRAPHY   Conclusion      Ost LAD lesion is 75% stenosed.   Prox LAD lesion is 50% stenosed.   Prox LAD to Mid LAD lesion is 35% stenosed.   Ost RCA lesion is 50% stenosed.   Origin to Prox Graft lesion is 100% stenosed.   Origin to Prox Graft lesion is 100% stenosed.   Ost LM to Dist LM lesion is 80% stenosed.   Ost Cx to Prox Cx lesion is 90% stenosed.   4th Mrg lesion is 90% stenosed.   2nd Mrg lesion is 90% stenosed.   2nd LPL lesion is 99% stenosed.   SVG graft was visualized by angiography.   SVG graft was visualized by angiography.   LIMA graft was visualized by angiography.   Small non-dominant RCA with mild proximal disease Severe calcified distal left main stenosis Severe proximal LAD stenosis. The mid and distal LAD and a diagonal branch fills from the patent LIMA graft.  Severe calcified stenosis of the ostium of the large, dominant Circumflex involving the ostium of a moderate caliber  intermediate branch. Severe ostial stenosis moderate caliber obtuse marginal branch. Severe stenosis small caliber distal obtuse marginal branch. Chronic occlusion of the vein graft to the OM branch and the vein graft to the left sided PDA Severe aortic stenosis by echo.  Normal right heart pressures.    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 The distal left main and ostia of the Circumflex and Ramus have severe calcified stenosis. There is also focal ostial disease in a moderate caliber OM branch. Given his young age, failure of vein grafts and severe AS, I think we have to consider redo CABG and surgical AVR. While PCI of the left main/Circumflex/Intermediate is feasible, it would be high risk given the bifurcation. Will discharge home today. He is seeing Dr. Laneta Simmers in the CT surgery office.    Indications  Severe aortic stenosis [I35.0 (  ICD-10-CM)]  Coronary artery disease involving native coronary artery of native heart with unstable angina pectoris ([place]) [I25.110 (ICD-10-CM)]   Procedural Details  Technical Details Indication: 66 yo male with CAD s/p 3V CABG in 2018, severe aortic stenosis with progressive dyspnea on exertion.   Procedure: The risks, benefits, complications, treatment options, and expected outcomes were discussed with the patient. The patient and/or family concurred with the proposed plan, giving informed consent. The patient was brought to the cath lab after IV hydration was given. The patient was sedated with Versed and Fentanyl. The IV catheter in the right antecubital vein was changed for a 5 [place] sheath. Right heart catheterization performed with a balloon tipped catheter. The left wrist was prepped and draped in a sterile fashion. 1% lidocaine was used for local anesthesia. Using the modified Seldinger access technique, a 6 French Slender sheath was placed in the left radial artery. 3 mg Verapamil was given through the sheath. Weight  based IV heparin was given. Standard diagnostic catheters were used to perform selective coronary angiography. I engaged the vein graft to the PDA with a JR4 catheter and the vein graft to the OM with a LCB catheter. The LIMA graft was engaged with an IMA catheter. I did not cross the aortic valve. All catheter exchanges were performed over an exchange length guidewire.   The sheath was removed from the left radial artery and a Zephyr hemostasis band was applied at the arteriotomy site on the left wrist.      Estimated blood loss <50 mL.   During this procedure medications were administered to achieve and maintain moderate conscious sedation while the patient's heart rate, blood pressure, and oxygen saturation were continuously monitored and I was present face-to-face 100% of this time.     Medications (Filter: Administrations occurring from 0724 to 0831 on 01/24/22)  important  Continuous medications are totaled by the amount administered until 01/24/22 0831.   Heparin (Porcine) in NaCl 1000-0.9 UT/500ML-% SOLN (mL) Total volume:  1,000 mL  Date/Time Rate/Dose/Volume Action   01/24/22 0730 500 mL Given   0730 500 mL Given    midazolam (VERSED) injection (mg) Total dose:  2 mg  Date/Time Rate/Dose/Volume Action   01/24/22 0745 2 mg Given    fentaNYL (SUBLIMAZE) injection (mcg) Total dose:  50 mcg  Date/Time Rate/Dose/Volume Action   01/24/22 0745 50 mcg Given    lidocaine (PF) (XYLOCAINE) 1 % injection (mL) Total volume:  4 mL  Date/Time Rate/Dose/Volume Action   01/24/22 0747 2 mL Given   0755 2 mL Given    Radial Cocktail/Verapamil only (mL) Total volume:  10 mL  Date/Time Rate/Dose/Volume Action   01/24/22 0758 10 mL Given    heparin sodium (porcine) injection (Units) Total dose:  4,000 Units  Date/Time Rate/Dose/Volume Action   01/24/22 0805 4,000 Units Given    iohexol (OMNIPAQUE) 350 MG/ML injection (mL) Total volume:  105 mL  Date/Time Rate/Dose/Volume  Action   01/24/22 0825 105 mL Given    Sedation Time  Sedation Time Physician-1: 37 minutes 7 seconds Contrast  Medication Name Total Dose  iohexol (OMNIPAQUE) 350 MG/ML injection 105 mL   Radiation/Fluoro  Fluoro time: 12.2 (min) DAP: 21002 (mGycm2) Cumulative Air Kerma: 123XX123 (mGy) Complications  Complications documented before study signed (01/24/2022  123456 AM)   No complications were associated with this study.  Documented by Scarlette Calico, RN - 01/24/2022  8:26 AM     Coronary Findings  Diagnostic Dominance: Left Left Main  Ost LM to Dist LM lesion is 80% stenosed.    Left Anterior Descending  Ost LAD lesion is 75% stenosed.  Prox LAD lesion is 50% stenosed.  Prox LAD to Mid LAD lesion is 35% stenosed. The lesion is calcified.    Lateral First Diagonal Branch  Vessel is small in size.    First Septal Branch  Vessel is small in size.    Second Septal Branch  Vessel is small in size.    Third Septal Branch  Vessel is small in size.    Ramus Intermedius  Vessel is small.    Left Circumflex  Vessel is large.  Ost Cx to Prox Cx lesion is 90% stenosed. The lesion is calcified.    Lateral First Obtuse Marginal Branch  Vessel is small in size.    Second Obtuse Marginal Branch  Vessel is large in size.  2nd Mrg lesion is 90% stenosed.    Third Obtuse Marginal Branch  Vessel is small in size.    Fourth Obtuse Marginal Branch  Vessel is small in size.  4th Mrg lesion is 90% stenosed.    Second Left Posterolateral Branch  2nd LPL lesion is 99% stenosed.    Right Coronary Artery  Vessel is small.  Ost RCA lesion is 50% stenosed.    Saphenous Graft To LPAV  SVG graft was visualized by angiography.  Origin to Prox Graft lesion is 100% stenosed. The lesion is chronically occluded.    Saphenous Graft To 2nd Mrg  SVG graft was visualized by angiography.  Origin to Prox Graft lesion is 100% stenosed. The lesion is chronically occluded.    LIMA LIMA  Graft To Dist LAD  LIMA graft was visualized by angiography.    Intervention   No interventions have been documented.   Coronary Diagrams  Diagnostic Dominance: Left  Intervention  Implants     No implant documentation for this case.   Syngo Images   Show images for CARDIAC CATHETERIZATION Images on Long Term Storage   Show images for [place], [place] to Procedure Log  Procedure Log    Hemo Data  Flowsheet Row Most Recent Value  Fick Cardiac Output 5.34 L/min  Fick Cardiac Output Index 2.68 (L/min)/BSA  RA A Wave 9 mmHg  RA V Wave 8 mmHg  RA Mean 8 mmHg  RV Systolic Pressure 25 mmHg  RV Diastolic Pressure 5 mmHg  RV EDP 6 mmHg  PA Systolic Pressure 27 mmHg  PA Diastolic Pressure 15 mmHg  PA Mean 21 mmHg  PW A Wave 11 mmHg  PW V Wave 10 mmHg  PW Mean 10 mmHg  AO Systolic Pressure 121 mmHg  AO Diastolic Pressure 44 mmHg  AO Mean 73 mmHg  QP/QS 1  TPVR Index 7.85 HRUI  TSVR Index 27.28 HRUI  PVR SVR Ratio 0.17  TPVR/TSVR Ratio 0.29    ADDENDUM REPORT: 10/31/2021 12:28   CLINICAL DATA:  Aortic stenosis   EXAM: Cardiac TAVR CT   TECHNIQUE: The patient was scanned on a Siemens Force 192 slice scanner. A 120 kV retrospective scan was triggered in the descending thoracic aorta at 111 HU's. Gantry rotation speed was 270 msecs and collimation was .9 mm. No beta blockade or nitro were given. The 3D data set was reconstructed in 5% intervals of the R-R cycle. Systolic and diastolic phases were analyzed on a dedicated work station using MPR, MIP and VRT modes. The patient received 80 cc of contrast.   FINDINGS: Aortic Valve:  Calcified tri leaflet with restricted leaflet motion Calcium score 2747   Aorta: No aneurysm normal arch vessels moderate calcific atherosclerosis   Sinotubular Junction: 26 mm   Ascending Thoracic Aorta: 34 mm   Aortic Arch: 26 mm   Descending Thoracic Aorta: 25 mm   Sinus of Valsalva Measurements:   Non-coronary: 29.2  mm   Right - coronary: 28 mm Sinus height 19 mm   Left - coronary: 29 mm  Sinus height 21 mm   Coronary Artery Height above Annulus:   Left Main: 13.6 mm above annulus   Right Coronary: 12.5 mm above annulus   Virtual Basal Annulus Measurements:   Maximum/Minimum Diameter: 22.2 mm x 26.2 mm   Perimeter: 75.2 mm   Area: 416.5 mm2   Coronary Arteries: Sufficient height above annulus for deployment The SVG to PDA and SVG to OM are occluded The LIMA to LAD is patent   Optimum Fluoroscopic Angle for Delivery: LAO 10 Caudal 6 degrees   Membranous septal length 6.3 mm   IMPRESSION: 1. Tri leaflet calcified AV with score 2747. Also central area of mal coaptation with moderate AR on TTE   2. Coronary arteries sufficient height above annulus for deployment Occluded SVG to RCA and OM. Patent LIMA to LAD   3. Annular area of 416.5 mm 2 suitable for a 23 mm Sapien 3 valve The right sinus diameter is a bit small for a 29 mm Evolute Pro valve   4. Membranous septal length 6.3 mm with baseline ECG LBBB moderate risk for CHB would also favor Sapien valve   5. Coronary arteries sufficient height above annulus for deployment Patent LIMA to LAD The SVGls to PDA and OM appear occluded   6. Optimum angiographic angle for deployment LAO 10 Caudal 6 degrees   Jenkins Rouge     Electronically Signed   By: Jenkins Rouge M.D.   On: 10/31/2021 12:28    Addended by Josue Hector, MD on 10/31/2021 12:30 PM   Study Result  Narrative & Impression  EXAM: OVER-READ INTERPRETATION  CT CHEST   The following report is an over-read performed by radiologist Dr. Salvatore Marvel of [facility], [place] on 10/31/2021. This over-read does not include interpretation of cardiac or coronary anatomy or pathology. The coronary CTA interpretation by the cardiologist is attached.   COMPARISON:  12/25/2016 chest CT.   FINDINGS: Please see the separate concurrent chest CT angiogram report  for details.   IMPRESSION: Please see the separate concurrent chest CT angiogram report for details.   Electronically Signed: By: Ilona Sorrel M.D. On: 10/31/2021 11:21     Impression:  This 66 year old gentleman has stage D, severe, symptomatic aortic stenosis with New York Heart Association class II-lll symptoms of exertional fatigue and shortness of breath consistent with chronic diastolic congestive heart failure.  His cardiac catheterization showed high-grade stenoses involving the distal left main and large dominant left circumflex system with occlusion of the previous vein grafts to the obtuse marginal and PDA off the left circumflex.  I agree that redo CABG and aortic valve replacement using a bioprosthetic valve is the best treatment for this patient.  I reviewed the echo and catheterization films with the patient and his wife.  I discussed the operative procedure with the patient and his wife including alternatives, benefits and risks; including but not limited to bleeding, blood transfusion, infection, stroke, myocardial infarction, graft failure, heart block requiring a permanent pacemaker, organ dysfunction, and death.  [name]  understands and agrees to proceed.      Plan:  He will be scheduled for redo CABG and AVR using a bioprosthetic valve on 02/27/2022.  I spent 60 minutes performing this consultation and > 50% of this time was spent face to face counseling and coordinating the care of this patient's severe aortic stenosis and multivessel coronary disease.  Gaye Pollack, MD [facility] [phone]

## 2022-02-24 NOTE — Pre-Procedure Instructions (Signed)
Surgical Instructions    Your procedure is scheduled on February 27, 2022.  Report to [facility] Main Entrance "A" at 5:30 A.M., then check in with the Admitting office.  Call this number if you have problems the morning of surgery:  [phone]   If you have any questions prior to your surgery date call [phone]: Open Monday-Friday 8am-4pm    Remember:  Do not eat or drink after midnight the night before your surgery    Take these medicines the morning of surgery with A SIP OF WATER:  acetaminophen (TYLENOL)   nebivolol (BYSTOLIC)   rosuvastatin (CRESTOR)  LORazepam (ATIVAN)   Follow your surgeon's instructions on when to stop Aspirin.  If no instructions were given by your surgeon then you will need to call the office to get those instructions.      As of today, STOP taking any Aleve, Naproxen, Ibuprofen, Motrin, Advil, Goody's, BC's, all herbal medications, fish oil, and all vitamins.                     Do NOT Smoke (Tobacco/Vaping) for 24 hours prior to your procedure.  If you use a CPAP at night, you may bring your mask/headgear for your overnight stay.   Contacts, glasses, piercing's, hearing aid's, dentures or partials may not be worn into surgery, please bring cases for these belongings.    For patients admitted to the hospital, discharge time will be determined by your treatment team.   Patients discharged the day of surgery will not be allowed to drive home, and someone needs to stay with them for 24 hours.  SURGICAL WAITING ROOM VISITATION Patients having surgery or a procedure may have two support people in the waiting room. These visitors may be switched out with other visitors if needed. Children under the age of 32 must have an adult accompany them who is not the patient. If the patient needs to stay at the hospital during part of their recovery, the visitor guidelines for inpatient rooms apply.  Please refer to the [facility] website for the visitor  guidelines for Inpatients (after your surgery is over and you are in a regular room).    Special instructions:   [place]- Preparing For Surgery  Before surgery, you can play an important role. Because skin is not sterile, your skin needs to be as free of germs as possible. You can reduce the number of germs on your skin by washing with CHG (chlorahexidine gluconate) Soap before surgery.  CHG is an antiseptic cleaner which kills germs and bonds with the skin to continue killing germs even after washing.    Oral Hygiene is also important to reduce your risk of infection.  Remember - BRUSH YOUR TEETH THE MORNING OF SURGERY WITH YOUR REGULAR TOOTHPASTE  Please do not use if you have an allergy to CHG or antibacterial soaps. If your skin becomes reddened/irritated stop using the CHG.  Do not shave (including legs and underarms) for at least 48 hours prior to first CHG shower. It is OK to shave your face.  Please follow these instructions carefully.   Shower the NIGHT BEFORE SURGERY and the MORNING OF SURGERY  If you chose to wash your hair, wash your hair first as usual with your normal shampoo.  After you shampoo, rinse your hair and body thoroughly to remove the shampoo.  Use CHG Soap as you would any other liquid soap. You can apply CHG directly to the skin and wash gently  with a scrungie or a clean washcloth.   Apply the CHG Soap to your body ONLY FROM THE NECK DOWN.  Do not use on open wounds or open sores. Avoid contact with your eyes, ears, mouth and genitals (private parts). Wash Face and genitals (private parts)  with your normal soap.   Wash thoroughly, paying special attention to the area where your surgery will be performed.  Thoroughly rinse your body with warm water from the neck down.  DO NOT shower/wash with your normal soap after using and rinsing off the CHG Soap.  Pat yourself dry with a CLEAN TOWEL.  Wear CLEAN PAJAMAS to bed the night before surgery  Place CLEAN  SHEETS on your bed the night before your surgery  DO NOT SLEEP WITH PETS.   Day of Surgery: Take a shower with CHG soap. Do not wear jewelry or makeup Do not wear lotions, powders, perfumes/colognes, or deodorant. Men may shave face and neck. Do not bring valuables to the hospital.  [facility] is not responsible for any belongings or valuables.  Wear Clean/Comfortable clothing the morning of surgery  Remember to brush your teeth WITH YOUR REGULAR TOOTHPASTE.   Please read over the following fact sheets that you were given.    If you received a COVID test during your pre-op visit  it is requested that you wear a mask when out in public, stay away from anyone that may not be feeling well and notify your surgeon if you develop symptoms. If you have been in contact with anyone that has tested positive in the last 10 days please notify you surgeon.

## 2022-02-25 ENCOUNTER — Encounter (HOSPITAL_COMMUNITY)
Admission: RE | Admit: 2022-02-25 | Discharge: 2022-02-25 | Disposition: A | Discharge status: Home / Self Care | Payer: Medicare HMO | Source: Ambulatory Visit | Attending: Surgery | Admitting: Surgery

## 2022-02-25 ENCOUNTER — Ambulatory Visit (HOSPITAL_BASED_OUTPATIENT_CLINIC_OR_DEPARTMENT_OTHER)
Admission: RE | Admit: 2022-02-25 | Discharge: 2022-02-25 | Disposition: A | Discharge status: Home / Self Care | Payer: Medicare HMO | Source: Ambulatory Visit | Attending: Surgery | Admitting: Surgery

## 2022-02-25 ENCOUNTER — Encounter (HOSPITAL_COMMUNITY): Payer: Self-pay

## 2022-02-25 ENCOUNTER — Ambulatory Visit (HOSPITAL_COMMUNITY)
Admission: RE | Admit: 2022-02-25 | Discharge: 2022-02-25 | Disposition: A | Discharge status: Home / Self Care | Payer: Medicare HMO | Source: Ambulatory Visit | Attending: Surgery | Admitting: Surgery

## 2022-02-25 ENCOUNTER — Other Ambulatory Visit: Payer: Self-pay

## 2022-02-25 VITALS — BP 127/48 | HR 55 | Temp 97.9°F | Resp 18

## 2022-02-25 DIAGNOSIS — Z87891 Personal history of nicotine dependence: Secondary | ICD-10-CM | POA: Insufficient documentation

## 2022-02-25 DIAGNOSIS — J982 Interstitial emphysema: Secondary | ICD-10-CM | POA: Diagnosis not present

## 2022-02-25 DIAGNOSIS — Z8249 Family history of ischemic heart disease and other diseases of the circulatory system: Secondary | ICD-10-CM | POA: Diagnosis not present

## 2022-02-25 DIAGNOSIS — K219 Gastro-esophageal reflux disease without esophagitis: Secondary | ICD-10-CM | POA: Diagnosis present

## 2022-02-25 DIAGNOSIS — K5730 Diverticulosis of large intestine without perforation or abscess without bleeding: Secondary | ICD-10-CM | POA: Insufficient documentation

## 2022-02-25 DIAGNOSIS — J90 Pleural effusion, not elsewhere classified: Secondary | ICD-10-CM | POA: Diagnosis not present

## 2022-02-25 DIAGNOSIS — Z952 Presence of prosthetic heart valve: Secondary | ICD-10-CM | POA: Diagnosis present

## 2022-02-25 DIAGNOSIS — I350 Nonrheumatic aortic (valve) stenosis: Secondary | ICD-10-CM | POA: Diagnosis not present

## 2022-02-25 DIAGNOSIS — D689 Coagulation defect, unspecified: Secondary | ICD-10-CM | POA: Diagnosis present

## 2022-02-25 DIAGNOSIS — I10 Essential (primary) hypertension: Secondary | ICD-10-CM | POA: Insufficient documentation

## 2022-02-25 DIAGNOSIS — I088 Other rheumatic multiple valve diseases: Secondary | ICD-10-CM | POA: Diagnosis not present

## 2022-02-25 DIAGNOSIS — I4891 Unspecified atrial fibrillation: Secondary | ICD-10-CM | POA: Diagnosis not present

## 2022-02-25 DIAGNOSIS — E785 Hyperlipidemia, unspecified: Secondary | ICD-10-CM | POA: Insufficient documentation

## 2022-02-25 DIAGNOSIS — I352 Nonrheumatic aortic (valve) stenosis with insufficiency: Secondary | ICD-10-CM | POA: Diagnosis present

## 2022-02-25 DIAGNOSIS — K449 Diaphragmatic hernia without obstruction or gangrene: Secondary | ICD-10-CM | POA: Insufficient documentation

## 2022-02-25 DIAGNOSIS — I447 Left bundle-branch block, unspecified: Secondary | ICD-10-CM | POA: Insufficient documentation

## 2022-02-25 DIAGNOSIS — Z951 Presence of aortocoronary bypass graft: Secondary | ICD-10-CM | POA: Diagnosis not present

## 2022-02-25 DIAGNOSIS — Z01818 Encounter for other preprocedural examination: Secondary | ICD-10-CM | POA: Insufficient documentation

## 2022-02-25 DIAGNOSIS — I25700 Atherosclerosis of coronary artery bypass graft(s), unspecified, with unstable angina pectoris: Secondary | ICD-10-CM | POA: Diagnosis present

## 2022-02-25 DIAGNOSIS — I700 Atherosclerosis of aorta: Secondary | ICD-10-CM | POA: Insufficient documentation

## 2022-02-25 DIAGNOSIS — Z7982 Long term (current) use of aspirin: Secondary | ICD-10-CM | POA: Diagnosis not present

## 2022-02-25 DIAGNOSIS — F419 Anxiety disorder, unspecified: Secondary | ICD-10-CM | POA: Diagnosis present

## 2022-02-25 DIAGNOSIS — F17220 Nicotine dependence, chewing tobacco, uncomplicated: Secondary | ICD-10-CM | POA: Diagnosis present

## 2022-02-25 DIAGNOSIS — I2510 Atherosclerotic heart disease of native coronary artery without angina pectoris: Secondary | ICD-10-CM

## 2022-02-25 DIAGNOSIS — D62 Acute posthemorrhagic anemia: Secondary | ICD-10-CM | POA: Diagnosis not present

## 2022-02-25 DIAGNOSIS — I25119 Atherosclerotic heart disease of native coronary artery with unspecified angina pectoris: Secondary | ICD-10-CM | POA: Diagnosis not present

## 2022-02-25 DIAGNOSIS — D696 Thrombocytopenia, unspecified: Secondary | ICD-10-CM | POA: Diagnosis present

## 2022-02-25 DIAGNOSIS — Z20822 Contact with and (suspected) exposure to covid-19: Secondary | ICD-10-CM | POA: Diagnosis present

## 2022-02-25 DIAGNOSIS — E8770 Fluid overload, unspecified: Secondary | ICD-10-CM | POA: Diagnosis not present

## 2022-02-25 DIAGNOSIS — Z79899 Other long term (current) drug therapy: Secondary | ICD-10-CM | POA: Diagnosis not present

## 2022-02-25 DIAGNOSIS — J9811 Atelectasis: Secondary | ICD-10-CM | POA: Diagnosis not present

## 2022-02-25 DIAGNOSIS — I358 Other nonrheumatic aortic valve disorders: Secondary | ICD-10-CM | POA: Diagnosis not present

## 2022-02-25 HISTORY — DX: Left bundle-branch block, unspecified: I44.7

## 2022-02-25 LAB — APTT: aPTT: 28 seconds (ref 24–36)

## 2022-02-25 LAB — SURGICAL PCR SCREEN
MRSA, PCR: NEGATIVE
Staphylococcus aureus: POSITIVE — AB

## 2022-02-25 LAB — COMPREHENSIVE METABOLIC PANEL
ALT: 28 U/L (ref 0–44)
AST: 26 U/L (ref 15–41)
Albumin: 4.1 g/dL (ref 3.5–5.0)
Alkaline Phosphatase: 40 U/L (ref 38–126)
Anion gap: 13 (ref 5–15)
BUN: 26 mg/dL — ABNORMAL HIGH (ref 8–23)
CO2: 21 mmol/L — ABNORMAL LOW (ref 22–32)
Calcium: 9.7 mg/dL (ref 8.9–10.3)
Chloride: 101 mmol/L (ref 98–111)
Creatinine, Ser: 1.2 mg/dL (ref 0.61–1.24)
GFR, Estimated: >60 mL/min (ref >60)
Glucose, Bld: 122 mg/dL — ABNORMAL HIGH (ref 70–99)
Potassium: 4.3 mmol/L (ref 3.5–5.1)
Sodium: 135 mmol/L (ref 135–145)
Total Bilirubin: 1 mg/dL (ref 0.3–1.2)
Total Protein: 7.2 g/dL (ref 6.5–8.1)

## 2022-02-25 LAB — PROTIME-INR
INR: 1 (ref 0.8–1.2)
Prothrombin Time: 12.7 seconds (ref 11.4–15.2)

## 2022-02-25 LAB — URINALYSIS, ROUTINE W REFLEX MICROSCOPIC
Bilirubin Urine: NEGATIVE
Glucose, UA: NEGATIVE mg/dL
Hgb urine dipstick: NEGATIVE
Ketones, ur: NEGATIVE mg/dL
Leukocytes,Ua: NEGATIVE
Nitrite: NEGATIVE
Protein, ur: NEGATIVE mg/dL
Specific Gravity, Urine: 1.018 (ref 1.005–1.030)
pH: 5 (ref 5.0–8.0)

## 2022-02-25 LAB — CBC
HCT: 50 % (ref 39.0–52.0)
Hemoglobin: 17.3 g/dL — ABNORMAL HIGH (ref 13.0–17.0)
MCH: 34.3 pg — ABNORMAL HIGH (ref 26.0–34.0)
MCHC: 34.6 g/dL (ref 30.0–36.0)
MCV: 99.2 fL (ref 80.0–100.0)
Platelets: 229 10*3/uL (ref 150–400)
RBC: 5.04 MIL/uL (ref 4.22–5.81)
RDW: 11.8 % (ref 11.5–15.5)
WBC: 11.4 10*3/uL — ABNORMAL HIGH (ref 4.0–10.5)
nRBC: 0 % (ref 0.0–0.2)

## 2022-02-25 LAB — BLOOD GAS, ARTERIAL
Acid-Base Excess: 6.2 mmol/L — ABNORMAL HIGH (ref 0.0–2.0)
Bicarbonate: 30.6 mmol/L — ABNORMAL HIGH (ref 20.0–28.0)
Drawn by: 58793
O2 Saturation: 99.4 %
Patient temperature: 37
pCO2 arterial: 42 mmHg (ref 32–48)
pH, Arterial: 7.47 — ABNORMAL HIGH (ref 7.35–7.45)
pO2, Arterial: 114 mmHg — ABNORMAL HIGH (ref 83–108)

## 2022-02-25 LAB — HEMOGLOBIN A1C
Hgb A1c MFr Bld: 6.2 % — ABNORMAL HIGH (ref 4.8–5.6)
Mean Plasma Glucose: 131.24 mg/dL

## 2022-02-25 NOTE — Progress Notes (Signed)
PCP - Dr. Carylon Perches Cardiologist - Dr. Charlton Haws  PPM/ICD - n/a  Chest x-ray - 02/25/22 EKG - 02/25/22 Stress Test - 12/03/16 ECHO - 09/26/21 Cardiac Cath -01/24/22   Sleep Study - denies CPAP - denies  Blood Thinner Instructions: n/a Aspirin Instructions: Continue therapy, none on the DOS.  NPO at MD.  COVID TEST- 02/25/22, done in PAT.  Anesthesia review: Yes, hx of CAD.  Patient denies shortness of breath, fever, cough and chest pain at PAT appointment   All instructions explained to the patient, with a verbal understanding of the material. Patient agrees to go over the instructions while at home for a better understanding. Patient also instructed to self quarantine after being tested for COVID-19. The opportunity to ask questions was provided.

## 2022-02-26 ENCOUNTER — Encounter (HOSPITAL_COMMUNITY): Payer: Self-pay

## 2022-02-26 ENCOUNTER — Other Ambulatory Visit: Payer: Self-pay | Admitting: *Deleted

## 2022-02-26 DIAGNOSIS — I2510 Atherosclerotic heart disease of native coronary artery without angina pectoris: Secondary | ICD-10-CM

## 2022-02-26 DIAGNOSIS — I350 Nonrheumatic aortic (valve) stenosis: Secondary | ICD-10-CM

## 2022-02-26 LAB — SARS CORONAVIRUS 2 (TAT 6-24 HRS): SARS Coronavirus 2: NEGATIVE

## 2022-02-26 MED ORDER — INSULIN REGULAR(HUMAN) IN NACL 100-0.9 UT/100ML-% IV SOLN
INTRAVENOUS | Status: AC
  Administered 2022-02-27: 1 [IU]/h via INTRAVENOUS
  Filled 2022-02-26: qty 100

## 2022-02-26 MED ORDER — EPINEPHRINE HCL 5 MG/250ML IV SOLN IN NS
0.0000 ug/min | INTRAVENOUS | Status: DC
  Filled 2022-02-26: qty 250

## 2022-02-26 MED ORDER — TRANEXAMIC ACID (OHS) PUMP PRIME SOLUTION
2.0000 mg/kg | INTRAVENOUS | Status: DC
  Filled 2022-02-26: qty 1.63

## 2022-02-26 MED ORDER — PLASMA-LYTE A IV SOLN
INTRAVENOUS | Status: DC
  Filled 2022-02-26: qty 2.5

## 2022-02-26 MED ORDER — TRANEXAMIC ACID 1000 MG/10ML IV SOLN
1.5000 mg/kg/h | INTRAVENOUS | Status: AC
  Administered 2022-02-27: 1.5 mg/kg/h via INTRAVENOUS
  Filled 2022-02-26 (×2): qty 25

## 2022-02-26 MED ORDER — HEPARIN 30,000 UNITS/1000 ML (OHS) CELLSAVER SOLUTION
Status: DC
  Filled 2022-02-26: qty 1000

## 2022-02-26 MED ORDER — PHENYLEPHRINE HCL-NACL 20-0.9 MG/250ML-% IV SOLN
30.0000 ug/min | INTRAVENOUS | Status: DC
  Filled 2022-02-26: qty 250

## 2022-02-26 MED ORDER — VANCOMYCIN HCL 1250 MG/250ML IV SOLN
1250.0000 mg | INTRAVENOUS | Status: AC
  Administered 2022-02-27: 1250 mg via INTRAVENOUS
  Filled 2022-02-26: qty 250

## 2022-02-26 MED ORDER — MANNITOL 20 % IV SOLN
INTRAVENOUS | Status: DC
  Filled 2022-02-26: qty 13

## 2022-02-26 MED ORDER — CEFAZOLIN SODIUM-DEXTROSE 2-4 GM/100ML-% IV SOLN
2.0000 g | INTRAVENOUS | Status: DC
  Filled 2022-02-26: qty 100

## 2022-02-26 MED ORDER — POTASSIUM CHLORIDE 2 MEQ/ML IV SOLN
80.0000 meq | INTRAVENOUS | Status: DC
  Filled 2022-02-26: qty 40

## 2022-02-26 MED ORDER — TRANEXAMIC ACID (OHS) BOLUS VIA INFUSION
15.0000 mg/kg | INTRAVENOUS | Status: AC
  Administered 2022-02-27: 1095 mg via INTRAVENOUS
  Filled 2022-02-26: qty 1095

## 2022-02-26 MED ORDER — MILRINONE LACTATE IN DEXTROSE 20-5 MG/100ML-% IV SOLN
0.3000 ug/kg/min | INTRAVENOUS | Status: DC
  Filled 2022-02-26: qty 100

## 2022-02-26 MED ORDER — NOREPINEPHRINE 4 MG/250ML-% IV SOLN
0.0000 ug/min | INTRAVENOUS | Status: DC
  Filled 2022-02-26: qty 250

## 2022-02-26 MED ORDER — NITROGLYCERIN IN D5W 200-5 MCG/ML-% IV SOLN
2.0000 ug/min | INTRAVENOUS | Status: AC
  Administered 2022-02-27: 5 ug/min via INTRAVENOUS
  Filled 2022-02-26 (×2): qty 250

## 2022-02-26 MED ORDER — CEFAZOLIN SODIUM-DEXTROSE 2-4 GM/100ML-% IV SOLN
2.0000 g | INTRAVENOUS | Status: AC
  Administered 2022-02-27 (×2): 2 g via INTRAVENOUS
  Filled 2022-02-26: qty 100

## 2022-02-26 MED ORDER — DEXMEDETOMIDINE HCL IN NACL 400 MCG/100ML IV SOLN
0.1000 ug/kg/h | INTRAVENOUS | Status: AC
  Administered 2022-02-27: .7 ug/kg/h via INTRAVENOUS
  Filled 2022-02-26: qty 100

## 2022-02-26 NOTE — H&P (Signed)
[address]       [place] [address]             [phone]      Cardiothoracic Surgery Admission History and Physical   PCP is Carylon Perches, MD Referring Provider is Wendall Stade, MD       Chief Complaint  Patient presents with   Coronary Artery Disease and severe aortic stenosis           HPI:   The patient is a 66 year old gentleman with a history of hypertension, hyperlipidemia, coronary artery disease status post CABG x3 by Dr. Tyrone Sage in 12/2016 (LIMA to LAD, SVG to OM, SVG to PDA), and aortic stenosis that has been followed by Dr. Eden Emms.  An echocardiogram in January 2022 showed an increase in the mean gradient from 19 mm year before to 30 mmHg with a valve area of 1.1 cm.  There was mild aortic insufficiency.  His most recent echo on 09/26/2021 showed further increase in the mean gradient to 38.5 mmHg with peak gradient of 61.2 mmHg.  Aortic valve area was 0.94 cm.  There was mild aortic insufficiency.  Left ventricular ejection fraction remains 55 to 60%.  He subsequent underwent a gated cardiac CTA which showed a severely calcified trileaflet aortic valve with restricted leaflet mobility.  The ascending aorta was measured at 3.4 cm with the sinotubular junction of 2.6 cm.  Cardiac catheterization was performed on 01/24/2022 and showed a patent left internal mammary graft to the LAD.  There is severe proximal LAD stenosis.  The mid and distal LAD and diagonal branch filled by the LIMA graft.  There was a severe calcified stenosis at the ostium of a large dominant left circumflex involving the ostium of a moderate-sized intermediate branch.  There is severe ostial stenosis of a moderate caliber obtuse marginal branch.  The vein grafts to the obtuse marginal branch and the PDA off the left circumflex were occluded.  Right heart pressures were normal.   The patient lives with his wife.  He denies any chest discomfort but has been having exertional shortness of  breath and fatigue with occasional episodes of positional dizziness.  He denies any peripheral edema.  He has had no orthopnea or PND.       Past Medical History:  Diagnosis Date   Anxiety     Aortic stenosis     Coronary artery disease      a. s/p CABG in 12/2016 with LIMA-LAD, reverse SVG-OM, and reverse SVG-PDA   GERD (gastroesophageal reflux disease)      occasionally   Hyperlipidemia     Hypertension             Past Surgical History:  Procedure Laterality Date   CARDIAC CATHETERIZATION       CORONARY ARTERY BYPASS GRAFT N/A 12/26/2016    Procedure: CORONARY ARTERY BYPASS GRAFTING (CABG) x 3 WITH ENDOSCOPIC HARVESTING OF RIGHT SAPHENOUS VEIN;  Surgeon: Delight Ovens, MD;  Location: [facility] OR;  Service: Open Heart Surgery;  Laterality: N/A;   LEFT HEART CATH AND CORONARY ANGIOGRAPHY N/A 12/12/2016    Procedure: Left Heart Cath and Coronary Angiography;  Surgeon: Lyn Records, MD;  Location: [facility] INVASIVE CV LAB;  Service: Cardiovascular;  Laterality: N/A;   none        none   RIGHT/LEFT HEART CATH AND CORONARY/GRAFT ANGIOGRAPHY N/A 01/24/2022    Procedure: RIGHT/LEFT HEART CATH AND CORONARY/GRAFT ANGIOGRAPHY;  Surgeon: Clifton James,  Nile Dear, MD;  Location: [facility] INVASIVE CV LAB;  Service: Cardiovascular;  Laterality: N/A;   TEE WITHOUT CARDIOVERSION N/A 12/26/2016    Procedure: TRANSESOPHAGEAL ECHOCARDIOGRAM (TEE);  Surgeon: Delight Ovens, MD;  Location: [facility] OR;  Service: Open Heart Surgery;  Laterality: N/A;           Family History  Problem Relation Age of Onset   Cancer Mother          Lung cancer   Early death Mother     Stroke Father     Heart disease Maternal Grandmother     Birth defects Maternal Aunt     Hyperlipidemia Maternal Aunt     Hearing loss Maternal Uncle        Social History Social History         Tobacco Use   Smoking status: Former      Packs/day: 1.00      Years: 20.00      Total pack years: 20.00      Types: Cigarettes      Quit date:  10/20/1993      Years since quitting: 28.3   Smokeless tobacco: Current      Types: Snuff  Vaping Use   Vaping Use: Never used  Substance Use Topics   Alcohol use: Not Currently      Alcohol/week: 1.0 standard drink of alcohol      Types: 1 Cans of beer per week      Comment: rare    Drug use: Yes      Types: Marijuana, Codeine      Comment: occasionally....takes 1 puff when really stressed            Current Outpatient Medications  Medication Sig Dispense Refill   acetaminophen (TYLENOL) 500 MG tablet Take 1,000 mg by mouth 2 (two) times daily.       amLODipine (NORVASC) 5 MG tablet Take 5 mg by mouth at bedtime.       aspirin EC 81 MG tablet Take 1 tablet (81 mg total) by mouth daily. 90 tablet 3   chlorthalidone (HYGROTON) 25 MG tablet TAKE 1 TABLET(25 MG) BY MOUTH DAILY 30 tablet 0   escitalopram (LEXAPRO) 10 MG tablet Take 10 mg by mouth at bedtime.       lisinopril (ZESTRIL) 20 MG tablet TAKE 1 TABLET BY MOUTH EVERY DAY 90 tablet 3   LORazepam (ATIVAN) 0.5 MG tablet Take 1 tablet (0.5 mg total) by mouth 2 (two) times daily as needed. for anxiety (Patient taking differently: Take 0.5 mg by mouth daily.) 60 tablet 0   nebivolol (BYSTOLIC) 10 MG tablet Take 10 mg by mouth daily.       rosuvastatin (CRESTOR) 40 MG tablet TAKE 1 TABLET BY MOUTH EVERY DAY 90 tablet 0    No current facility-administered medications for this visit.      No Known Allergies   Review of Systems  Constitutional:  Positive for fatigue.  HENT: Negative.         Recently had some teeth extracted in preparation for surgery.  Eyes: Negative.   Respiratory:  Positive for shortness of breath.   Cardiovascular:  Negative for chest pain, palpitations and leg swelling.  Gastrointestinal: Negative.   Endocrine: Negative.   Genitourinary: Negative.   Musculoskeletal: Negative.   Allergic/Immunologic: Negative.   Neurological:  Negative for dizziness and syncope.  Hematological: Negative.    Psychiatric/Behavioral: Negative.        BP (!) 118/56  Pulse 60   Resp 20   Ht  (1.778 m)   Wt 180 lb (81.6 kg)   SpO2 95% Comment: RA  BMI 25.83 kg/m  Physical Exam Constitutional:      Appearance: Normal appearance. He is normal weight.  HENT:     Head: Normocephalic and atraumatic.     Mouth/Throat:     Mouth: Mucous membranes are moist.     Pharynx: Oropharynx is clear.  Eyes:     Extraocular Movements: Extraocular movements intact.     Conjunctiva/sclera: Conjunctivae normal.     Pupils: Pupils are equal, round, and reactive to light.  Neck:     Vascular: No carotid bruit.  Cardiovascular:     Rate and Rhythm: Normal rate and regular rhythm.     Pulses: Normal pulses.     Heart sounds: Murmur heard.     Comments: 3/6 systolic murmur right sternal border.  There is no diastolic murmur. Pulmonary:     Effort: Pulmonary effort is normal.     Breath sounds: Normal breath sounds.  Abdominal:     General: There is no distension.     Tenderness: There is no abdominal tenderness.  Musculoskeletal:        General: No swelling. Normal range of motion.     Cervical back: Normal range of motion and neck supple.  Skin:    General: Skin is warm and dry.  Neurological:     General: No focal deficit present.     Mental Status: He is alert and oriented to person, place, and time.  Psychiatric:        Mood and Affect: Mood normal.        Behavior: Behavior normal.        Diagnostic Tests:   ECHOCARDIOGRAM REPORT         Patient Name:   Samuel Cook Date of Exam: 09/26/2021  Medical Rec #:  [id number]    Height:       69.0 in  Accession #:    [id number]   Weight:       179.0 lb  Date of Birth:  [birth year]   BSA:          1.971 m  Patient Age:    65 years     BP:           175/58 mmHg  Patient Gender: M            HR:           63 bpm.  [facility]   Procedure: 2D Echo, Cardiac Doppler and Color Doppler   Indications:    I35.0 (ICD-10-CM) -  Nonrheumatic aortic valve stenosis     History:        Patient has prior history of Echocardiogram examinations,  most                  recent 08/20/2020. CAD, Prior CABG, Aortic Valve Disease;  Risk                  Factors:Hypertension, Dyslipidemia and Former Smoker.     Sonographer:    Celesta Gentile RCS  Referring Phys: [id number] Wendall Stade   IMPRESSIONS     1. Left ventricular ejection fraction, by estimation, is 55 to 60%. The  left ventricle has normal function. The left ventricle has no regional  wall motion abnormalities. Left ventricular diastolic parameters were  normal.   2.  Right ventricular systolic function is normal. The right ventricular  size is normal.   3. Left atrial size was mildly dilated.   4. The mitral valve is degenerative. Trivial mitral valve regurgitation.  No evidence of mitral stenosis.   5. AS more severe than 08/20/20 mean gradient 29->38.5 peak 50 -> 61.2 mmHg  AVA about the same and DVI about the same. The aortic valve is tricuspid.  There is moderate calcification of the aortic valve. There is moderate  thickening of the aortic valve.  Aortic valve regurgitation is mild. Moderate to severe aortic valve  stenosis.   6. The inferior vena cava is normal in size with greater than 50%  respiratory variability, suggesting right atrial pressure of 3 mmHg.   FINDINGS   Left Ventricle: Left ventricular ejection fraction, by estimation, is 55  to 60%. The left ventricle has normal function. The left ventricle has no  regional wall motion abnormalities. The left ventricular internal cavity  size was normal in size. There is   no left ventricular hypertrophy. Left ventricular diastolic parameters  were normal.   Right Ventricle: The right ventricular size is normal. No increase in  right ventricular wall thickness. Right ventricular systolic function is  normal.   Left Atrium: Left atrial size was mildly dilated.   Right Atrium: Right atrial size was  normal in size.   Pericardium: There is no evidence of pericardial effusion.   Mitral Valve: The mitral valve is degenerative in appearance. There is  mild thickening of the mitral valve leaflet(s). There is mild  calcification of the mitral valve leaflet(s). Mild mitral annular  calcification. Trivial mitral valve regurgitation. No  evidence of mitral valve stenosis.   Tricuspid Valve: The tricuspid valve is normal in structure. Tricuspid  valve regurgitation is not demonstrated. No evidence of tricuspid  stenosis.   Aortic Valve: AS more severe than 08/20/20 mean gradient 29->38.5 peak 50 ->  61.2 mmHg AVA about the same and DVI about the same. The aortic valve is  tricuspid. There is moderate calcification of the aortic valve. There is  moderate thickening of the aortic   valve. Aortic valve regurgitation is mild. Aortic regurgitation PHT  measures 338 msec. Moderate to severe aortic stenosis is present. Aortic  valve mean gradient measures 38.5 mmHg. Aortic valve peak gradient  measures 61.2 mmHg. Aortic valve area, by VTI   measures 0.94 cm.   Pulmonic Valve: The pulmonic valve was normal in structure. Pulmonic valve  regurgitation is not visualized. No evidence of pulmonic stenosis.   Aorta: The aortic root is normal in size and structure.   Venous: The inferior vena cava is normal in size with greater than 50%  respiratory variability, suggesting right atrial pressure of 3 mmHg.   IAS/Shunts: No atrial level shunt detected by color flow Doppler.      LEFT VENTRICLE  PLAX 2D  LVIDd:         5.20 cm   Diastology  LVIDs:         3.40 cm   LV e' medial:    7.18 cm/s  LV PW:         1.00 cm   LV E/e' medial:  16.7  LV IVS:        1.00 cm   LV e' lateral:   7.29 cm/s  LVOT diam:     1.75 cm   LV E/e' lateral: 16.5  LV SV:         103  LV SV Index:   52  LVOT Area:     2.41 cm      RIGHT VENTRICLE  RV S prime:     9.25 cm/s  TAPSE (M-mode): 2.0 cm   LEFT ATRIUM              Index        RIGHT ATRIUM           Index  LA diam:        4.15 cm 2.11 cm/m   RA Area:     18.70 cm  LA Vol (A2C):   86.4 ml 43.84 ml/m  RA Volume:   52.70 ml  26.74 ml/m  LA Vol (A4C):   73.6 ml 37.34 ml/m  LA Biplane Vol: 79.6 ml 40.39 ml/m   AORTIC VALVE  AV Area (Vmax):    0.95 cm  AV Area (Vmean):   0.91 cm  AV Area (VTI):     0.94 cm  AV Vmax:           391.00 cm/s  AV Vmean:          295.000 cm/s  AV VTI:            1.095 m  AV Peak Grad:      61.2 mmHg  AV Mean Grad:      38.5 mmHg  LVOT Vmax:         154.00 cm/s  LVOT Vmean:        111.500 cm/s  LVOT VTI:          0.427 m  LVOT/AV VTI ratio: 0.39  AI PHT:            338 msec     AORTA  Ao Root diam: 3.00 cm   MITRAL VALVE  MV Area (PHT): 3.08 cm     SHUNTS  MV Decel Time: 246 msec     Systemic VTI:  0.43 m  MV E velocity: 120.00 cm/s  Systemic Diam: 1.75 cm  MV A velocity: 84.40 cm/s  MV E/A ratio:  1.42   Charlton HawsPeter Nishan MD  Electronically signed by Charlton HawsPeter Nishan MD  Signature Date/Time: 09/26/2021/3:58:50 PM       Physicians   Panel Physicians Referring Physician Case Authorizing Physician  Kathleene HazelMcAlhany, Christopher D, MD (Primary)        Procedures   RIGHT/LEFT HEART CATH AND CORONARY/GRAFT ANGIOGRAPHY    Conclusion       Ost LAD lesion is 75% stenosed.   Prox LAD lesion is 50% stenosed.   Prox LAD to Mid LAD lesion is 35% stenosed.   Ost RCA lesion is 50% stenosed.   Origin to Prox Graft lesion is 100% stenosed.   Origin to Prox Graft lesion is 100% stenosed.   Ost LM to Dist LM lesion is 80% stenosed.   Ost Cx to Prox Cx lesion is 90% stenosed.   4th Mrg lesion is 90% stenosed.   2nd Mrg lesion is 90% stenosed.   2nd LPL lesion is 99% stenosed.   SVG graft was visualized by angiography.   SVG graft was visualized by angiography.   LIMA graft was visualized by angiography.   Small non-dominant RCA with mild proximal disease Severe calcified distal left main stenosis Severe proximal  LAD stenosis. The mid and distal LAD and a diagonal branch fills from the patent LIMA graft.  Severe calcified stenosis of the ostium of the large, dominant Circumflex involving the ostium of a moderate  caliber intermediate branch. Severe ostial stenosis moderate caliber obtuse marginal branch. Severe stenosis small caliber distal obtuse marginal branch. Chronic occlusion of the vein graft to the OM branch and the vein graft to the left sided PDA Severe aortic stenosis by echo.  Normal right heart pressures.    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 The distal left main and ostia of the Circumflex and Ramus have severe calcified stenosis. There is also focal ostial disease in a moderate caliber OM branch. Given his young age, failure of vein grafts and severe AS, I think we have to consider redo CABG and surgical AVR. While PCI of the left main/Circumflex/Intermediate is feasible, it would be high risk given the bifurcation. Will discharge home today. He is seeing Dr. Laneta Simmers in the CT surgery office.    Indications   Severe aortic stenosis [I35.0 (ICD-10-CM)]  Coronary artery disease involving native coronary artery of native heart with unstable angina pectoris (HCC) [I25.110 (ICD-10-CM)]    Procedural Details   Technical Details Indication: 66 yo male with CAD s/p 3V CABG in 2018, severe aortic stenosis with progressive dyspnea on exertion.   Procedure: The risks, benefits, complications, treatment options, and expected outcomes were discussed with the patient. The patient and/or family concurred with the proposed plan, giving informed consent. The patient was brought to the cath lab after IV hydration was given. The patient was sedated with Versed and Fentanyl. The IV catheter in the right antecubital vein was changed for a 5 [place] sheath. Right heart catheterization performed with a balloon tipped catheter. The left wrist was prepped and draped in a sterile  fashion. 1% lidocaine was used for local anesthesia. Using the modified Seldinger access technique, a 6 French Slender sheath was placed in the left radial artery. 3 mg Verapamil was given through the sheath. Weight based IV heparin was given. Standard diagnostic catheters were used to perform selective coronary angiography. I engaged the vein graft to the PDA with a JR4 catheter and the vein graft to the OM with a LCB catheter. The LIMA graft was engaged with an IMA catheter. I did not cross the aortic valve. All catheter exchanges were performed over an exchange length guidewire.   The sheath was removed from the left radial artery and a Zephyr hemostasis band was applied at the arteriotomy site on the left wrist.      Estimated blood loss <50 mL.   During this procedure medications were administered to achieve and maintain moderate conscious sedation while the patient's heart rate, blood pressure, and oxygen saturation were continuously monitored and I was present face-to-face 100% of this time.      Medications (Filter: Administrations occurring from 0724 to 0831 on 01/24/22)  important  Continuous medications are totaled by the amount administered until 01/24/22 0831.    Heparin (Porcine) in NaCl 1000-0.9 UT/500ML-% SOLN (mL) Total volume:  1,000 mL  Date/Time Rate/Dose/Volume Action    01/24/22 0730 500 mL Given    0730 500 mL Given      midazolam (VERSED) injection (mg) Total dose:  2 mg  Date/Time Rate/Dose/Volume Action    01/24/22 0745 2 mg Given      fentaNYL (SUBLIMAZE) injection (mcg) Total dose:  50 mcg  Date/Time Rate/Dose/Volume Action    01/24/22 0745 50 mcg Given      lidocaine (PF) (XYLOCAINE) 1 % injection (mL) Total volume:  4 mL  Date/Time Rate/Dose/Volume Action    01/24/22 0747 2 mL  Given    0755 2 mL Given      Radial Cocktail/Verapamil only (mL) Total volume:  10 mL  Date/Time Rate/Dose/Volume Action    01/24/22 0758 10 mL Given      heparin  sodium (porcine) injection (Units) Total dose:  4,000 Units  Date/Time Rate/Dose/Volume Action    01/24/22 0805 4,000 Units Given      iohexol (OMNIPAQUE) 350 MG/ML injection (mL) Total volume:  105 mL  Date/Time Rate/Dose/Volume Action    01/24/22 0825 105 mL Given      Sedation Time   Sedation Time Physician-1: 37 minutes 7 seconds Contrast   Medication Name Total Dose  iohexol (OMNIPAQUE) 350 MG/ML injection 105 mL    Radiation/Fluoro   Fluoro time: 12.2 (min) DAP: 21002 (mGycm2) Cumulative Air Kerma: 399 (mGy) Complications      Complications documented before study signed (01/24/2022  8:41 AM)     No complications were associated with this study.  Documented by Renne Musca, RN - 01/24/2022  8:26 AM      Coronary Findings   Diagnostic Dominance: Left Left Main  Ost LM to Dist LM lesion is 80% stenosed.    Left Anterior Descending  Ost LAD lesion is 75% stenosed.  Prox LAD lesion is 50% stenosed.  Prox LAD to Mid LAD lesion is 35% stenosed. The lesion is calcified.    Lateral First Diagonal Branch  Vessel is small in size.    First Septal Branch  Vessel is small in size.    Second Septal Branch  Vessel is small in size.    Third Septal Branch  Vessel is small in size.    Ramus Intermedius  Vessel is small.    Left Circumflex  Vessel is large.  Ost Cx to Prox Cx lesion is 90% stenosed. The lesion is calcified.    Lateral First Obtuse Marginal Branch  Vessel is small in size.    Second Obtuse Marginal Branch  Vessel is large in size.  2nd Mrg lesion is 90% stenosed.    Third Obtuse Marginal Branch  Vessel is small in size.    Fourth Obtuse Marginal Branch  Vessel is small in size.  4th Mrg lesion is 90% stenosed.    Second Left Posterolateral Branch  2nd LPL lesion is 99% stenosed.    Right Coronary Artery  Vessel is small.  Ost RCA lesion is 50% stenosed.    Saphenous Graft To LPAV  SVG graft was visualized by angiography.   Origin to Prox Graft lesion is 100% stenosed. The lesion is chronically occluded.    Saphenous Graft To 2nd Mrg  SVG graft was visualized by angiography.  Origin to Prox Graft lesion is 100% stenosed. The lesion is chronically occluded.    LIMA LIMA Graft To Dist LAD  LIMA graft was visualized by angiography.    Intervention    No interventions have been documented.    Coronary Diagrams   Diagnostic Dominance: Left  Intervention   Implants      No implant documentation for this case.    Syngo Images    Show images for CARDIAC CATHETERIZATION Images on Long Term Storage    Show images for Clevon, Khader to Procedure Log   Procedure Log    Hemo Data   Flowsheet Row Most Recent Value  Fick Cardiac Output 5.34 L/min  Fick Cardiac Output Index 2.68 (L/min)/BSA  RA A Wave 9 mmHg  RA V Wave 8 mmHg  RA  Mean 8 mmHg  RV Systolic Pressure 25 mmHg  RV Diastolic Pressure 5 mmHg  RV EDP 6 mmHg  PA Systolic Pressure 27 mmHg  PA Diastolic Pressure 15 mmHg  PA Mean 21 mmHg  PW A Wave 11 mmHg  PW V Wave 10 mmHg  PW Mean 10 mmHg  AO Systolic Pressure 121 mmHg  AO Diastolic Pressure 44 mmHg  AO Mean 73 mmHg  QP/QS 1  TPVR Index 7.85 HRUI  TSVR Index 27.28 HRUI  PVR SVR Ratio 0.17  TPVR/TSVR Ratio 0.29      ADDENDUM REPORT: 10/31/2021 12:28   CLINICAL DATA:  Aortic stenosis   EXAM: Cardiac TAVR CT   TECHNIQUE: The patient was scanned on a Siemens Force 192 slice scanner. A 120 kV retrospective scan was triggered in the descending thoracic aorta at 111 HU's. Gantry rotation speed was 270 msecs and collimation was .9 mm. No beta blockade or nitro were given. The 3D data set was reconstructed in 5% intervals of the R-R cycle. Systolic and diastolic phases were analyzed on a dedicated work station using MPR, MIP and VRT modes. The patient received 80 cc of contrast.   FINDINGS: Aortic Valve: Calcified tri leaflet with restricted leaflet motion Calcium score  2747   Aorta: No aneurysm normal arch vessels moderate calcific atherosclerosis   Sinotubular Junction: 26 mm   Ascending Thoracic Aorta: 34 mm   Aortic Arch: 26 mm   Descending Thoracic Aorta: 25 mm   Sinus of Valsalva Measurements:   Non-coronary: 29.2 mm   Right - coronary: 28 mm Sinus height 19 mm   Left - coronary: 29 mm  Sinus height 21 mm   Coronary Artery Height above Annulus:   Left Main: 13.6 mm above annulus   Right Coronary: 12.5 mm above annulus   Virtual Basal Annulus Measurements:   Maximum/Minimum Diameter: 22.2 mm x 26.2 mm   Perimeter: 75.2 mm   Area: 416.5 mm2   Coronary Arteries: Sufficient height above annulus for deployment The SVG to PDA and SVG to OM are occluded The LIMA to LAD is patent   Optimum Fluoroscopic Angle for Delivery: LAO 10 Caudal 6 degrees   Membranous septal length 6.3 mm   IMPRESSION: 1. Tri leaflet calcified AV with score 2747. Also central area of mal coaptation with moderate AR on TTE   2. Coronary arteries sufficient height above annulus for deployment Occluded SVG to RCA and OM. Patent LIMA to LAD   3. Annular area of 416.5 mm 2 suitable for a 23 mm Sapien 3 valve The right sinus diameter is a bit small for a 29 mm Evolute Pro valve   4. Membranous septal length 6.3 mm with baseline ECG LBBB moderate risk for CHB would also favor Sapien valve   5. Coronary arteries sufficient height above annulus for deployment Patent LIMA to LAD The SVGls to PDA and OM appear occluded   6. Optimum angiographic angle for deployment LAO 10 Caudal 6 degrees   Charlton Haws     Electronically Signed   By: Charlton Haws M.D.   On: 10/31/2021 12:28    Addended by Wendall Stade, MD on 10/31/2021 12:30 PM    Study Result   Narrative & Impression  EXAM: OVER-READ INTERPRETATION  CT CHEST   The following report is an over-read performed by radiologist Dr. Cleone Slim of [facility] on 10/31/2021. This  over-read does not include interpretation of cardiac or coronary anatomy or pathology. The coronary CTA  interpretation by the cardiologist is attached.   COMPARISON:  12/25/2016 chest CT.   FINDINGS: Please see the separate concurrent chest CT angiogram report for details.   IMPRESSION: Please see the separate concurrent chest CT angiogram report for details.   Electronically Signed: By: Delbert Phenix M.D. On: 10/31/2021 11:21      Impression:   This 66 year old gentleman has stage D, severe, symptomatic aortic stenosis with New York Heart Association class II-lll symptoms of exertional fatigue and shortness of breath consistent with chronic diastolic congestive heart failure.  His cardiac catheterization showed high-grade stenoses involving the distal left main and large dominant left circumflex system with occlusion of the previous vein grafts to the obtuse marginal and PDA off the left circumflex.  I agree that redo CABG and aortic valve replacement using a bioprosthetic valve is the best treatment for this patient. His preop arterial dopplers show that his hands are not dependent on the radial arteries. Vein mapping shows that the left GSV appears to be present and adequate sized. I reviewed the echo and catheterization films with the patient and his wife.  I discussed the operative procedure with the patient and his wife including alternatives, benefits and risks; including but not limited to bleeding, blood transfusion, infection, stroke, myocardial infarction, graft failure, heart block requiring a permanent pacemaker, organ dysfunction, and death.  [name] understands and agrees to proceed.       Plan:   Redo CABG and AVR using a bioprosthetic valve.      [name], MD [facility] [phone]

## 2022-02-26 NOTE — Anesthesia Preprocedure Evaluation (Addendum)
Anesthesia Evaluation  Patient identified by MRN, date of birth, ID band Patient awake    Reviewed: Allergy & Precautions, NPO status , Patient's Chart, lab work & pertinent test results  Airway Mallampati: II  TM Distance: >3 FB Neck ROM: Full    Dental  (+) Dental Advisory Given   Pulmonary former smoker,    Pulmonary exam normal breath sounds clear to auscultation       Cardiovascular hypertension, + angina + CAD  + Valvular Problems/Murmurs AS  Rhythm:Regular Rate:Normal + Systolic murmurs Echo 09/2021 1. Left ventricular ejection fraction, by estimation, is 55 to 60%. The left ventricle has normal function. The left ventricle has no regional wall motion abnormalities. Left ventricular diastolic parameters were normal.  2. Right ventricular systolic function is normal. The right ventricular size is normal.  3. Left atrial size was mildly dilated.  4. The mitral valve is degenerative. Trivial mitral valve regurgitation. No evidence of mitral stenosis.  5. AS more severe than 08/20/20 mean gradient 29->38.5 peak 50 -> 61.2 mmHg AVA about the same and DVI about the same. The aortic valve is tricuspid. There is moderate calcification of the aortic valve. There is moderate thickening of the aortic valve. Aortic valve regurgitation is mild. Moderate to severe aortic valve stenosis.  6. The inferior vena cava is normal in size with greater than 50% respiratory variability, suggesting right atrial pressure of 3 mmHg.    Neuro/Psych PSYCHIATRIC DISORDERS Anxiety negative neurological ROS     GI/Hepatic Neg liver ROS, GERD  ,  Endo/Other  negative endocrine ROS  Renal/GU negative Renal ROS     Musculoskeletal negative musculoskeletal ROS (+)   Abdominal   Peds  Hematology negative hematology ROS (+)   Anesthesia Other Findings   Reproductive/Obstetrics                                                             Anesthesia Evaluation  Patient identified by MRN, date of birth, ID band Patient awake    Reviewed: Allergy & Precautions, NPO status , Patient's Chart, lab work & pertinent test results  History of Anesthesia Complications Negative for: history of anesthetic complications  Airway Mallampati: III  TM Distance: <3 FB Neck ROM: Full    Dental  (+) Teeth Intact   Pulmonary neg shortness of breath, neg sleep apnea, neg COPD, neg recent URI, former smoker,    breath sounds clear to auscultation       Cardiovascular hypertension, Pt. on medications and Pt. on home beta blockers + CAD   Rhythm:Regular + Systolic murmurs    Neuro/Psych PSYCHIATRIC DISORDERS Anxiety negative neurological ROS     GI/Hepatic Neg liver ROS, GERD  Medicated and Controlled,  Endo/Other  negative endocrine ROS  Renal/GU negative Renal ROS     Musculoskeletal   Abdominal   Peds  Hematology negative hematology ROS (+)   Anesthesia Other Findings   Reproductive/Obstetrics                             Anesthesia Physical Anesthesia Plan  ASA: IV  Anesthesia Plan: General   Post-op Pain Management:    Induction: Intravenous  Airway Management Planned: Oral ETT  Additional Equipment: Arterial line, TEE, CVP, PA Cath  and Ultrasound Guidance Line Placement  Intra-op Plan:   Post-operative Plan: Post-operative intubation/ventilation  Informed Consent: I have reviewed the patients History and Physical, chart, labs and discussed the procedure including the risks, benefits and alternatives for the proposed anesthesia with the patient or authorized representative who has indicated his/her understanding and acceptance.   Dental advisory given  Plan Discussed with: CRNA and Surgeon  Anesthesia Plan Comments:         Anesthesia Quick Evaluation  Anesthesia Physical Anesthesia Plan  ASA: 4  Anesthesia Plan: General   Post-op Pain  Management:    Induction: Intravenous  PONV Risk Score and Plan: 3 and Treatment may vary due to age or medical condition and Midazolam  Airway Management Planned: Oral ETT  Additional Equipment: Arterial line, CVP, PA Cath, TEE and Ultrasound Guidance Line Placement  Intra-op Plan: Utilization Of Total Body Hypothermia per surgeon request  Post-operative Plan: Post-operative intubation/ventilation  Informed Consent: I have reviewed the patients History and Physical, chart, labs and discussed the procedure including the risks, benefits and alternatives for the proposed anesthesia with the patient or authorized representative who has indicated his/her understanding and acceptance.     Dental advisory given  Plan Discussed with: CRNA  Anesthesia Plan Comments: (PAT note written 02/26/2022 by Shonna Chock, PA-C. )      Anesthesia Quick Evaluation

## 2022-02-26 NOTE — Progress Notes (Addendum)
Anesthesia Chart Review:  Case: [id number] Date/Time: 02/27/22 0715   Procedures:      REDO CORONARY ARTERY BYPASS GRAFTING (CABG)     AORTIC VALVE REPLACEMENT (AVR) (Chest)     RADIAL ARTERY HARVEST (Left: Arm Lower)     TRANSESOPHAGEAL ECHOCARDIOGRAM (TEE)   Anesthesia type: General   Pre-op diagnosis: CAD, AS   [facility] / [facility]   Surgeons: [name], MD       DISCUSSION: Patient is a 66 year old male scheduled for the above procedure.  History includes former smoker (quit 10/20/93), HTN, HLD, CAD (s/p CABG 12/25/16: LIMA-LAD, SVG-OM, SVG-PDA; vein grafts occluded 01/24/22), murmur/aortic stenosis, left BBB, GERD.  Anesthesia team to evaluate on the day of surgery.  VS: BP (!) 127/48   Pulse (!) 55   Temp 36.6 C   Resp 18   SpO2 96%   PROVIDERS: Carylon Perches, MD is PCP  Charlton Haws, MD is cardiologist   LABS: Labs reviewed: Acceptable for surgery. (all labs ordered are listed, but only abnormal results are displayed)  Labs Reviewed  SURGICAL PCR SCREEN - Abnormal; Notable for the following components:      Result Value   Staphylococcus aureus POSITIVE (*)    All other components within normal limits  CBC - Abnormal; Notable for the following components:   WBC 11.4 (*)    Hemoglobin 17.3 (*)    MCH 34.3 (*)    All other components within normal limits  COMPREHENSIVE METABOLIC PANEL - Abnormal; Notable for the following components:   CO2 21 (*)    Glucose, Bld 122 (*)    BUN 26 (*)    All other components within normal limits  BLOOD GAS, ARTERIAL - Abnormal; Notable for the following components:   pH, Arterial 7.47 (*)    pO2, Arterial 114 (*)    Bicarbonate 30.6 (*)    Acid-Base Excess 6.2 (*)    All other components within normal limits  HEMOGLOBIN A1C - Abnormal; Notable for the following components:   Hgb A1c MFr Bld 6.2 (*)    All other components within normal limits  SARS CORONAVIRUS 2 (TAT 6-24 HRS)  PROTIME-INR  APTT  URINALYSIS,  ROUTINE W REFLEX MICROSCOPIC  TYPE AND SCREEN     IMAGES: CXR 02/25/22: FINDINGS: Heart size is normal. Median sternotomy and CABG. No focal consolidations or pleural effusions. No pulmonary edema. IMPRESSION: No active cardiopulmonary disease.   CTA Chest/abd/pelvis 10/31/20: IMPRESSION: 1. Vascular findings and measurements pertinent to potential TAVR procedure, as detailed. Note is made of atherosclerotic stenoses of the bilateral common femoral arteries, focally severe on the left and moderate on the right. 2. Diffuse thickening and coarse calcification of the aortic valve, compatible with the reported history of severe aortic stenosis. 3. Moderate hiatal hernia. 4. Moderate left colonic diverticulosis. 5.  Aortic Atherosclerosis (ICD10-I70.0).    EKG: 02/25/22: Normal sinus rhythm Left bundle branch block Abnormal ECG When compared with ECG of 29-Dec-2016 12:43, PREVIOUS ECG IS PRESENT Since last tracing rate slower Confirmed by Nanetta Batty [phone]) on 02/25/2022 4:37:10 PM   CV: US Carotid 02/25/22: Summary:  Right Carotid: Velocities in the right ICA are consistent with a 1-39%  stenosis.  Left Carotid: Velocities in the left ICA are consistent with a 1-39%  stenosis.  Vertebrals: Bilateral vertebral arteries demonstrate antegrade flow.    RHC/LHC 01/24/22:   Ost LAD lesion is 75% stenosed.   Prox LAD lesion is 50% stenosed.   Prox  LAD to Mid LAD lesion is 35% stenosed.   Ost RCA lesion is 50% stenosed.   Origin to Prox Graft lesion is 100% stenosed.   Origin to Prox Graft lesion is 100% stenosed.   Ost LM to Dist LM lesion is 80% stenosed.   Ost Cx to Prox Cx lesion is 90% stenosed.   4th Mrg lesion is 90% stenosed.   2nd Mrg lesion is 90% stenosed.   2nd LPL lesion is 99% stenosed.   SVG graft was visualized by angiography.   SVG graft was visualized by angiography.   LIMA graft was visualized by angiography.   Small non-dominant RCA with mild  proximal disease Severe calcified distal left main stenosis Severe proximal LAD stenosis. The mid and distal LAD and a diagonal branch fills from the patent LIMA graft.  Severe calcified stenosis of the ostium of the large, dominant Circumflex involving the ostium of a moderate caliber intermediate branch. Severe ostial stenosis moderate caliber obtuse marginal branch. Severe stenosis small caliber distal obtuse marginal branch. Chronic occlusion of the vein graft to the OM branch and the vein graft to the left sided PDA Severe aortic stenosis by echo.  Normal right heart pressures.    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 The distal left main and ostia of the Circumflex and Ramus have severe calcified stenosis. There is also focal ostial disease in a moderate caliber OM branch. Given his young age, failure of vein grafts and severe AS, I think we have to consider redo CABG and surgical AVR. While PCI of the left main/Circumflex/Intermediate is feasible, it would be high risk given the bifurcation. Will discharge home today. He is seeing Dr. Laneta Simmers in the CT surgery office.    Echo 09/26/21: IMPRESSIONS   1. Left ventricular ejection fraction, by estimation, is 55 to 60%. The  left ventricle has normal function. The left ventricle has no regional  wall motion abnormalities. Left ventricular diastolic parameters were  normal.   2. Right ventricular systolic function is normal. The right ventricular  size is normal.   3. Left atrial size was mildly dilated.   4. The mitral valve is degenerative. Trivial mitral valve regurgitation.  No evidence of mitral stenosis.   5. AS more severe than 08/20/20 mean gradient 29->38.5 peak 50 -> 61.2 mmHg  AVA about the same and DVI about the same. The aortic valve is tricuspid.  There is moderate calcification of the aortic valve. There is moderate  thickening of the aortic valve.  Aortic valve regurgitation is mild. Moderate  to severe aortic valve  stenosis.   6. The inferior vena cava is normal in size with greater than 50%  respiratory variability, suggesting right atrial pressure of 3 mmHg.    Past Medical History:  Diagnosis Date   Anxiety    Aortic stenosis    Coronary artery disease    a. s/p CABG in 12/2016 with LIMA-LAD, reverse SVG-OM, and reverse SVG-PDA   GERD (gastroesophageal reflux disease)    occasionally   Heart murmur    Hyperlipidemia    Hypertension     Past Surgical History:  Procedure Laterality Date   CARDIAC CATHETERIZATION     CORONARY ARTERY BYPASS GRAFT N/A 12/26/2016   Procedure: CORONARY ARTERY BYPASS GRAFTING (CABG) x 3 WITH ENDOSCOPIC HARVESTING OF RIGHT SAPHENOUS VEIN;  Surgeon: Delight Ovens, MD;  Location: [facility] OR;  Service: Open Heart Surgery;  Laterality: N/A;   LEFT HEART CATH  AND CORONARY ANGIOGRAPHY N/A 12/12/2016   Procedure: Left Heart Cath and Coronary Angiography;  Surgeon: Lyn Records, MD;  Location: [facility] INVASIVE CV LAB;  Service: Cardiovascular;  Laterality: N/A;   none     none   RIGHT/LEFT HEART CATH AND CORONARY/GRAFT ANGIOGRAPHY N/A 01/24/2022   Procedure: RIGHT/LEFT HEART CATH AND CORONARY/GRAFT ANGIOGRAPHY;  Surgeon: Kathleene Hazel, MD;  Location: [facility] INVASIVE CV LAB;  Service: Cardiovascular;  Laterality: N/A;   TEE WITHOUT CARDIOVERSION N/A 12/26/2016   Procedure: TRANSESOPHAGEAL ECHOCARDIOGRAM (TEE);  Surgeon: Delight Ovens, MD;  Location: [facility];  Service: Open Heart Surgery;  Laterality: N/A;   WISDOM TOOTH EXTRACTION      MEDICATIONS:  acetaminophen (TYLENOL) 500 MG tablet   amLODipine (NORVASC) 5 MG tablet   aspirin EC 81 MG tablet   chlorthalidone (HYGROTON) 25 MG tablet   escitalopram (LEXAPRO) 10 MG tablet   lisinopril (ZESTRIL) 20 MG tablet   LORazepam (ATIVAN) 0.5 MG tablet   nebivolol (BYSTOLIC) 10 MG tablet   rosuvastatin (CRESTOR) 40 MG tablet   No current facility-administered medications for this encounter.     Shonna Chock, PA-C Surgical Short Stay/Anesthesiology [facility] Phone [phone] [facility] Phone [phone] 02/26/2022 12:57 PM

## 2022-02-27 ENCOUNTER — Inpatient Hospital Stay (HOSPITAL_COMMUNITY): Payer: Medicare HMO | Admitting: Certified Registered"

## 2022-02-27 ENCOUNTER — Other Ambulatory Visit: Payer: Self-pay

## 2022-02-27 ENCOUNTER — Inpatient Hospital Stay (HOSPITAL_COMMUNITY): Payer: Medicare HMO

## 2022-02-27 ENCOUNTER — Other Ambulatory Visit: Payer: Self-pay | Admitting: Cardiology

## 2022-02-27 ENCOUNTER — Inpatient Hospital Stay (HOSPITAL_COMMUNITY)
Admission: RE | Disposition: A | Discharge status: Home / Self Care | Payer: Self-pay | Source: Ambulatory Visit | Attending: Surgery

## 2022-02-27 ENCOUNTER — Encounter (HOSPITAL_COMMUNITY): Payer: Self-pay | Admitting: Surgery

## 2022-02-27 ENCOUNTER — Inpatient Hospital Stay (HOSPITAL_COMMUNITY)
Admission: RE | Admit: 2022-02-27 | Discharge: 2022-03-05 | DRG: 220 | Disposition: A | Discharge status: Home / Self Care | Payer: Medicare HMO | Source: Ambulatory Visit | Attending: Surgery | Admitting: Surgery

## 2022-02-27 DIAGNOSIS — I10 Essential (primary) hypertension: Secondary | ICD-10-CM

## 2022-02-27 DIAGNOSIS — Z7982 Long term (current) use of aspirin: Secondary | ICD-10-CM

## 2022-02-27 DIAGNOSIS — E785 Hyperlipidemia, unspecified: Secondary | ICD-10-CM | POA: Diagnosis present

## 2022-02-27 DIAGNOSIS — Z8249 Family history of ischemic heart disease and other diseases of the circulatory system: Secondary | ICD-10-CM | POA: Diagnosis not present

## 2022-02-27 DIAGNOSIS — I4891 Unspecified atrial fibrillation: Secondary | ICD-10-CM | POA: Diagnosis not present

## 2022-02-27 DIAGNOSIS — I350 Nonrheumatic aortic (valve) stenosis: Secondary | ICD-10-CM

## 2022-02-27 DIAGNOSIS — D689 Coagulation defect, unspecified: Secondary | ICD-10-CM | POA: Diagnosis not present

## 2022-02-27 DIAGNOSIS — Z952 Presence of prosthetic heart valve: Secondary | ICD-10-CM

## 2022-02-27 DIAGNOSIS — I088 Other rheumatic multiple valve diseases: Secondary | ICD-10-CM | POA: Diagnosis not present

## 2022-02-27 DIAGNOSIS — Z20822 Contact with and (suspected) exposure to covid-19: Secondary | ICD-10-CM | POA: Diagnosis not present

## 2022-02-27 DIAGNOSIS — I25119 Atherosclerotic heart disease of native coronary artery with unspecified angina pectoris: Secondary | ICD-10-CM

## 2022-02-27 DIAGNOSIS — Z87891 Personal history of nicotine dependence: Secondary | ICD-10-CM

## 2022-02-27 DIAGNOSIS — I2510 Atherosclerotic heart disease of native coronary artery without angina pectoris: Secondary | ICD-10-CM

## 2022-02-27 DIAGNOSIS — F419 Anxiety disorder, unspecified: Secondary | ICD-10-CM | POA: Diagnosis not present

## 2022-02-27 DIAGNOSIS — F17220 Nicotine dependence, chewing tobacco, uncomplicated: Secondary | ICD-10-CM | POA: Diagnosis not present

## 2022-02-27 DIAGNOSIS — I352 Nonrheumatic aortic (valve) stenosis with insufficiency: Secondary | ICD-10-CM

## 2022-02-27 DIAGNOSIS — D62 Acute posthemorrhagic anemia: Secondary | ICD-10-CM | POA: Diagnosis not present

## 2022-02-27 DIAGNOSIS — I358 Other nonrheumatic aortic valve disorders: Secondary | ICD-10-CM | POA: Diagnosis not present

## 2022-02-27 DIAGNOSIS — J982 Interstitial emphysema: Secondary | ICD-10-CM | POA: Diagnosis not present

## 2022-02-27 DIAGNOSIS — Z79899 Other long term (current) drug therapy: Secondary | ICD-10-CM

## 2022-02-27 DIAGNOSIS — I25700 Atherosclerosis of coronary artery bypass graft(s), unspecified, with unstable angina pectoris: Secondary | ICD-10-CM | POA: Diagnosis not present

## 2022-02-27 DIAGNOSIS — K219 Gastro-esophageal reflux disease without esophagitis: Secondary | ICD-10-CM | POA: Diagnosis present

## 2022-02-27 DIAGNOSIS — D696 Thrombocytopenia, unspecified: Secondary | ICD-10-CM | POA: Diagnosis present

## 2022-02-27 DIAGNOSIS — E8770 Fluid overload, unspecified: Secondary | ICD-10-CM | POA: Diagnosis not present

## 2022-02-27 DIAGNOSIS — J9811 Atelectasis: Secondary | ICD-10-CM | POA: Diagnosis not present

## 2022-02-27 DIAGNOSIS — I447 Left bundle-branch block, unspecified: Secondary | ICD-10-CM | POA: Diagnosis present

## 2022-02-27 DIAGNOSIS — Z951 Presence of aortocoronary bypass graft: Secondary | ICD-10-CM

## 2022-02-27 DIAGNOSIS — J90 Pleural effusion, not elsewhere classified: Secondary | ICD-10-CM | POA: Diagnosis not present

## 2022-02-27 HISTORY — PX: RADIAL ARTERY HARVEST: SHX5067

## 2022-02-27 HISTORY — PX: AORTIC VALVE REPLACEMENT: SHX41

## 2022-02-27 HISTORY — PX: TEE WITHOUT CARDIOVERSION: SHX5443

## 2022-02-27 HISTORY — PX: CORONARY ARTERY BYPASS GRAFT: SHX141

## 2022-02-27 LAB — POCT I-STAT, CHEM 8
BUN: 25 mg/dL — ABNORMAL HIGH (ref 8–23)
BUN: 24 mg/dL — ABNORMAL HIGH (ref 8–23)
BUN: 22 mg/dL (ref 8–23)
BUN: 21 mg/dL (ref 8–23)
BUN: 22 mg/dL (ref 8–23)
BUN: 21 mg/dL (ref 8–23)
BUN: 21 mg/dL (ref 8–23)
Calcium, Ion: 1.24 mmol/L (ref 1.15–1.40)
Calcium, Ion: 1.22 mmol/L (ref 1.15–1.40)
Calcium, Ion: 1.09 mmol/L — ABNORMAL LOW (ref 1.15–1.40)
Calcium, Ion: 0.99 mmol/L — ABNORMAL LOW (ref 1.15–1.40)
Calcium, Ion: 1 mmol/L — ABNORMAL LOW (ref 1.15–1.40)
Calcium, Ion: 0.94 mmol/L — ABNORMAL LOW (ref 1.15–1.40)
Calcium, Ion: 0.91 mmol/L — ABNORMAL LOW (ref 1.15–1.40)
Chloride: 100 mmol/L (ref 98–111)
Chloride: 101 mmol/L (ref 98–111)
Chloride: 101 mmol/L (ref 98–111)
Chloride: 100 mmol/L (ref 98–111)
Chloride: 100 mmol/L (ref 98–111)
Chloride: 100 mmol/L (ref 98–111)
Chloride: 101 mmol/L (ref 98–111)
Creatinine, Ser: 0.9 mg/dL (ref 0.61–1.24)
Creatinine, Ser: 0.9 mg/dL (ref 0.61–1.24)
Creatinine, Ser: 0.9 mg/dL (ref 0.61–1.24)
Creatinine, Ser: 0.8 mg/dL (ref 0.61–1.24)
Creatinine, Ser: 0.8 mg/dL (ref 0.61–1.24)
Creatinine, Ser: 0.7 mg/dL (ref 0.61–1.24)
Creatinine, Ser: 0.8 mg/dL (ref 0.61–1.24)
Glucose, Bld: 112 mg/dL — ABNORMAL HIGH (ref 70–99)
Glucose, Bld: 113 mg/dL — ABNORMAL HIGH (ref 70–99)
Glucose, Bld: 108 mg/dL — ABNORMAL HIGH (ref 70–99)
Glucose, Bld: 114 mg/dL — ABNORMAL HIGH (ref 70–99)
Glucose, Bld: 128 mg/dL — ABNORMAL HIGH (ref 70–99)
Glucose, Bld: 129 mg/dL — ABNORMAL HIGH (ref 70–99)
Glucose, Bld: 124 mg/dL — ABNORMAL HIGH (ref 70–99)
HCT: 41 % (ref 39.0–52.0)
HCT: 42 % (ref 39.0–52.0)
HCT: 35 % — ABNORMAL LOW (ref 39.0–52.0)
HCT: 37 % — ABNORMAL LOW (ref 39.0–52.0)
HCT: 35 % — ABNORMAL LOW (ref 39.0–52.0)
HCT: 32 % — ABNORMAL LOW (ref 39.0–52.0)
HCT: 30 % — ABNORMAL LOW (ref 39.0–52.0)
Hemoglobin: 13.9 g/dL (ref 13.0–17.0)
Hemoglobin: 14.3 g/dL (ref 13.0–17.0)
Hemoglobin: 11.9 g/dL — ABNORMAL LOW (ref 13.0–17.0)
Hemoglobin: 12.6 g/dL — ABNORMAL LOW (ref 13.0–17.0)
Hemoglobin: 11.9 g/dL — ABNORMAL LOW (ref 13.0–17.0)
Hemoglobin: 10.9 g/dL — ABNORMAL LOW (ref 13.0–17.0)
Hemoglobin: 10.2 g/dL — ABNORMAL LOW (ref 13.0–17.0)
Potassium: 3.8 mmol/L (ref 3.5–5.1)
Potassium: 4.1 mmol/L (ref 3.5–5.1)
Potassium: 5.1 mmol/L (ref 3.5–5.1)
Potassium: 4.8 mmol/L (ref 3.5–5.1)
Potassium: 4.4 mmol/L (ref 3.5–5.1)
Potassium: 4.2 mmol/L (ref 3.5–5.1)
Potassium: 3.5 mmol/L (ref 3.5–5.1)
Sodium: 139 mmol/L (ref 135–145)
Sodium: 138 mmol/L (ref 135–145)
Sodium: 134 mmol/L — ABNORMAL LOW (ref 135–145)
Sodium: 136 mmol/L (ref 135–145)
Sodium: 136 mmol/L (ref 135–145)
Sodium: 137 mmol/L (ref 135–145)
Sodium: 140 mmol/L (ref 135–145)
TCO2: 27 mmol/L (ref 22–32)
TCO2: 26 mmol/L (ref 22–32)
TCO2: 27 mmol/L (ref 22–32)
TCO2: 26 mmol/L (ref 22–32)
TCO2: 26 mmol/L (ref 22–32)
TCO2: 24 mmol/L (ref 22–32)
TCO2: 23 mmol/L (ref 22–32)

## 2022-02-27 LAB — CBC
HCT: 29.7 % — ABNORMAL LOW (ref 39.0–52.0)
Hemoglobin: 10.3 g/dL — ABNORMAL LOW (ref 13.0–17.0)
MCH: 35.2 pg — ABNORMAL HIGH (ref 26.0–34.0)
MCHC: 34.7 g/dL (ref 30.0–36.0)
MCV: 101.4 fL — ABNORMAL HIGH (ref 80.0–100.0)
Platelets: 95 10*3/uL — ABNORMAL LOW (ref 150–400)
RBC: 2.93 MIL/uL — ABNORMAL LOW (ref 4.22–5.81)
RDW: 11.9 % (ref 11.5–15.5)
WBC: 8.6 10*3/uL (ref 4.0–10.5)
nRBC: 0 % (ref 0.0–0.2)

## 2022-02-27 LAB — POCT I-STAT 7, (LYTES, BLD GAS, ICA,H+H)
Acid-Base Excess: 1 mmol/L (ref 0.0–2.0)
Acid-base deficit: 1 mmol/L (ref 0.0–2.0)
Bicarbonate: 26.9 mmol/L (ref 20.0–28.0)
Bicarbonate: 24.5 mmol/L (ref 20.0–28.0)
Calcium, Ion: 1.03 mmol/L — ABNORMAL LOW (ref 1.15–1.40)
Calcium, Ion: 0.93 mmol/L — ABNORMAL LOW (ref 1.15–1.40)
HCT: 34 % — ABNORMAL LOW (ref 39.0–52.0)
HCT: 28 % — ABNORMAL LOW (ref 39.0–52.0)
Hemoglobin: 11.6 g/dL — ABNORMAL LOW (ref 13.0–17.0)
Hemoglobin: 9.5 g/dL — ABNORMAL LOW (ref 13.0–17.0)
O2 Saturation: 100 %
O2 Saturation: 99 %
Potassium: 4.5 mmol/L (ref 3.5–5.1)
Potassium: 3.4 mmol/L — ABNORMAL LOW (ref 3.5–5.1)
Sodium: 138 mmol/L (ref 135–145)
Sodium: 140 mmol/L (ref 135–145)
TCO2: 28 mmol/L (ref 22–32)
TCO2: 26 mmol/L (ref 22–32)
pCO2 arterial: 45.2 mmHg (ref 32–48)
pCO2 arterial: 41.8 mmHg (ref 32–48)
pH, Arterial: 7.384 (ref 7.35–7.45)
pH, Arterial: 7.376 (ref 7.35–7.45)
pO2, Arterial: 424 mmHg — ABNORMAL HIGH (ref 83–108)
pO2, Arterial: 152 mmHg — ABNORMAL HIGH (ref 83–108)

## 2022-02-27 LAB — HEMOGLOBIN AND HEMATOCRIT, BLOOD
HCT: 32.7 % — ABNORMAL LOW (ref 39.0–52.0)
Hemoglobin: 11.6 g/dL — ABNORMAL LOW (ref 13.0–17.0)

## 2022-02-27 LAB — ECHO INTRAOPERATIVE TEE
AV Mean grad: 25 mmHg
Height: 70 in
Weight: 3008 oz

## 2022-02-27 LAB — PLATELET COUNT: Platelets: 91 10*3/uL — ABNORMAL LOW (ref 150–400)

## 2022-02-27 LAB — GLUCOSE, CAPILLARY
Glucose-Capillary: 133 mg/dL — ABNORMAL HIGH (ref 70–99)
Glucose-Capillary: 143 mg/dL — ABNORMAL HIGH (ref 70–99)
Glucose-Capillary: 132 mg/dL — ABNORMAL HIGH (ref 70–99)
Glucose-Capillary: 124 mg/dL — ABNORMAL HIGH (ref 70–99)
Glucose-Capillary: 141 mg/dL — ABNORMAL HIGH (ref 70–99)

## 2022-02-27 LAB — POCT I-STAT EG7
Acid-Base Excess: 1 mmol/L (ref 0.0–2.0)
Bicarbonate: 27.3 mmol/L (ref 20.0–28.0)
Calcium, Ion: 1.08 mmol/L — ABNORMAL LOW (ref 1.15–1.40)
HCT: 34 % — ABNORMAL LOW (ref 39.0–52.0)
Hemoglobin: 11.6 g/dL — ABNORMAL LOW (ref 13.0–17.0)
O2 Saturation: 89 %
Potassium: 4.4 mmol/L (ref 3.5–5.1)
Sodium: 139 mmol/L (ref 135–145)
TCO2: 29 mmol/L (ref 22–32)
pCO2, Ven: 48 mmHg (ref 44–60)
pH, Ven: 7.362 (ref 7.25–7.43)
pO2, Ven: 59 mmHg — ABNORMAL HIGH (ref 32–45)

## 2022-02-27 LAB — FIBRINOGEN: Fibrinogen: 165 mg/dL — ABNORMAL LOW (ref 210–475)

## 2022-02-27 LAB — APTT: aPTT: 39 seconds — ABNORMAL HIGH (ref 24–36)

## 2022-02-27 LAB — PROTIME-INR
INR: 1.6 — ABNORMAL HIGH (ref 0.8–1.2)
Prothrombin Time: 18.9 seconds — ABNORMAL HIGH (ref 11.4–15.2)

## 2022-02-27 LAB — PREPARE RBC (CROSSMATCH)

## 2022-02-27 SURGERY — REDO CORONARY ARTERY BYPASS GRAFTING (CABG)
Anesthesia: General | Site: Chest

## 2022-02-27 MED ORDER — MIDAZOLAM HCL (PF) 10 MG/2ML IJ SOLN
INTRAMUSCULAR | Status: AC
  Filled 2022-02-27: qty 2

## 2022-02-27 MED ORDER — ROCURONIUM BROMIDE 10 MG/ML (PF) SYRINGE
PREFILLED_SYRINGE | INTRAVENOUS | Status: AC
  Filled 2022-02-27: qty 20

## 2022-02-27 MED ORDER — SODIUM CHLORIDE 0.9% FLUSH
3.0000 mL | Freq: Two times a day (BID) | INTRAVENOUS | Status: DC
  Administered 2022-02-28 – 2022-03-04 (×9): 3 mL via INTRAVENOUS

## 2022-02-27 MED ORDER — FENTANYL CITRATE (PF) 250 MCG/5ML IJ SOLN
INTRAMUSCULAR | Status: AC
  Filled 2022-02-27: qty 5

## 2022-02-27 MED ORDER — ORAL CARE MOUTH RINSE
15.0000 mL | OROMUCOSAL | Status: DC
  Administered 2022-02-27 (×2): 15 mL via OROMUCOSAL

## 2022-02-27 MED ORDER — THROMBIN (RECOMBINANT) 20000 UNITS EX SOLR
CUTANEOUS | Status: AC
Start: 2022-02-27 — End: ?
  Filled 2022-02-27: qty 20000

## 2022-02-27 MED ORDER — ROCURONIUM BROMIDE 10 MG/ML (PF) SYRINGE
PREFILLED_SYRINGE | INTRAVENOUS | Status: DC | PRN
  Administered 2022-02-27 (×4): 30 mg via INTRAVENOUS
  Administered 2022-02-27: 100 mg via INTRAVENOUS
  Administered 2022-02-27 (×3): 30 mg via INTRAVENOUS

## 2022-02-27 MED ORDER — ROCURONIUM BROMIDE 10 MG/ML (PF) SYRINGE
PREFILLED_SYRINGE | INTRAVENOUS | Status: AC
  Filled 2022-02-27: qty 10

## 2022-02-27 MED ORDER — PROTAMINE SULFATE 10 MG/ML IV SOLN
INTRAVENOUS | Status: AC
Start: 2022-02-27 — End: ?
  Filled 2022-02-27: qty 5

## 2022-02-27 MED ORDER — ALBUMIN HUMAN 5 % IV SOLN
250.0000 mL | INTRAVENOUS | Status: DC | PRN
  Administered 2022-02-27 – 2022-02-28 (×2): 12.5 g via INTRAVENOUS
  Filled 2022-02-27: qty 250

## 2022-02-27 MED ORDER — HEPARIN SODIUM (PORCINE) 1000 UNIT/ML IJ SOLN
INTRAMUSCULAR | Status: AC
  Filled 2022-02-27: qty 1

## 2022-02-27 MED ORDER — BISACODYL 10 MG RE SUPP: 10.0000 mg | Freq: Every day | RECTAL | Status: DC

## 2022-02-27 MED ORDER — SODIUM CHLORIDE 0.9 % IV SOLN: 250.0000 mL | INTRAVENOUS | Status: DC

## 2022-02-27 MED ORDER — LACTATED RINGERS IV SOLN: INTRAVENOUS | Status: DC | PRN

## 2022-02-27 MED ORDER — LACTATED RINGERS IV SOLN
500.0000 mL | Freq: Once | INTRAVENOUS | Status: AC | PRN
  Administered 2022-02-28: 500 mL via INTRAVENOUS

## 2022-02-27 MED ORDER — ACETAMINOPHEN 160 MG/5ML PO SOLN: 650.0000 mg | Freq: Once | ORAL | Status: AC

## 2022-02-27 MED ORDER — METOPROLOL TARTRATE 12.5 MG HALF TABLET
12.5000 mg | ORAL_TABLET | Freq: Two times a day (BID) | ORAL | Status: DC
Start: 2022-02-27 — End: 2022-03-03
  Administered 2022-02-27 – 2022-03-02 (×7): 12.5 mg via ORAL
  Filled 2022-02-27 (×7): qty 1

## 2022-02-27 MED ORDER — MAGNESIUM SULFATE 4 GM/100ML IV SOLN
4.0000 g | Freq: Once | INTRAVENOUS | Status: AC
  Administered 2022-02-27: 4 g via INTRAVENOUS
  Filled 2022-02-27: qty 100

## 2022-02-27 MED ORDER — SODIUM CHLORIDE 0.45 % IV SOLN: INTRAVENOUS | Status: DC | PRN

## 2022-02-27 MED ORDER — FENTANYL CITRATE (PF) 250 MCG/5ML IJ SOLN
INTRAMUSCULAR | Status: DC | PRN
  Administered 2022-02-27 (×2): 100 ug via INTRAVENOUS
  Administered 2022-02-27: 150 ug via INTRAVENOUS
  Administered 2022-02-27 (×2): 100 ug via INTRAVENOUS
  Administered 2022-02-27: 250 ug via INTRAVENOUS
  Administered 2022-02-27 (×2): 150 ug via INTRAVENOUS
  Administered 2022-02-27: 100 ug via INTRAVENOUS
  Administered 2022-02-27: 50 ug via INTRAVENOUS

## 2022-02-27 MED ORDER — ACETAMINOPHEN 500 MG PO TABS
1000.0000 mg | ORAL_TABLET | Freq: Four times a day (QID) | ORAL | Status: AC
  Administered 2022-02-28 – 2022-03-04 (×17): 1000 mg via ORAL
  Filled 2022-02-27 (×17): qty 2

## 2022-02-27 MED ORDER — PHENYLEPHRINE HCL-NACL 20-0.9 MG/250ML-% IV SOLN: 0.0000 ug/min | INTRAVENOUS | Status: DC

## 2022-02-27 MED ORDER — CHLORHEXIDINE GLUCONATE CLOTH 2 % EX PADS
6.0000 | MEDICATED_PAD | Freq: Every day | CUTANEOUS | Status: DC
  Administered 2022-02-27 – 2022-03-01 (×3): 6 via TOPICAL

## 2022-02-27 MED ORDER — LACTATED RINGERS IV SOLN: INTRAVENOUS | Status: DC

## 2022-02-27 MED ORDER — TRAMADOL HCL 50 MG PO TABS
50.0000 mg | ORAL_TABLET | ORAL | Status: DC | PRN
  Administered 2022-02-28 (×4): 100 mg via ORAL
  Filled 2022-02-27 (×4): qty 2

## 2022-02-27 MED ORDER — MIDAZOLAM HCL (PF) 5 MG/ML IJ SOLN
INTRAMUSCULAR | Status: DC | PRN
  Administered 2022-02-27: 2 mg via INTRAVENOUS
  Administered 2022-02-27: 5 mg via INTRAVENOUS
  Administered 2022-02-27: 3 mg via INTRAVENOUS

## 2022-02-27 MED ORDER — MORPHINE SULFATE (PF) 2 MG/ML IV SOLN
1.0000 mg | INTRAVENOUS | Status: DC | PRN
  Administered 2022-02-27: 2 mg via INTRAVENOUS
  Administered 2022-02-27: 4 mg via INTRAVENOUS
  Administered 2022-03-01: 2 mg via INTRAVENOUS
  Filled 2022-02-27: qty 2
  Filled 2022-02-27: qty 1
  Filled 2022-02-27: qty 2
  Filled 2022-02-27 (×2): qty 1

## 2022-02-27 MED ORDER — SODIUM CHLORIDE 0.9% IV SOLUTION: Freq: Once | INTRAVENOUS | Status: AC

## 2022-02-27 MED ORDER — NITROGLYCERIN IN D5W 200-5 MCG/ML-% IV SOLN: 0.0000 ug/min | INTRAVENOUS | Status: DC

## 2022-02-27 MED ORDER — ESCITALOPRAM OXALATE 10 MG PO TABS
10.0000 mg | ORAL_TABLET | Freq: Every day | ORAL | Status: DC
  Administered 2022-02-28 – 2022-03-04 (×5): 10 mg via ORAL
  Filled 2022-02-27 (×5): qty 1

## 2022-02-27 MED ORDER — 0.9 % SODIUM CHLORIDE (POUR BTL) OPTIME
TOPICAL | Status: DC | PRN
  Administered 2022-02-27: 4000 mL

## 2022-02-27 MED ORDER — PANTOPRAZOLE SODIUM 40 MG PO TBEC
40.0000 mg | DELAYED_RELEASE_TABLET | Freq: Every day | ORAL | Status: DC
  Administered 2022-03-01 – 2022-03-05 (×5): 40 mg via ORAL
  Filled 2022-02-27 (×5): qty 1

## 2022-02-27 MED ORDER — HEMOSTATIC AGENTS (NO CHARGE) OPTIME
TOPICAL | Status: DC | PRN
  Administered 2022-02-27: 1 via TOPICAL

## 2022-02-27 MED ORDER — SODIUM CHLORIDE 0.9 % IV SOLN: INTRAVENOUS | Status: DC | PRN

## 2022-02-27 MED ORDER — PHENYLEPHRINE HCL-NACL 20-0.9 MG/250ML-% IV SOLN
INTRAVENOUS | Status: DC | PRN
  Administered 2022-02-27 (×2): 30 ug/min via INTRAVENOUS

## 2022-02-27 MED ORDER — SODIUM CHLORIDE 0.9% FLUSH: 10.0000 mL | INTRAVENOUS | Status: DC | PRN

## 2022-02-27 MED ORDER — HEPARIN SODIUM (PORCINE) 1000 UNIT/ML IJ SOLN
INTRAMUSCULAR | Status: DC | PRN
  Administered 2022-02-27: 28000 [IU] via INTRAVENOUS

## 2022-02-27 MED ORDER — METOPROLOL TARTRATE 12.5 MG HALF TABLET
12.5000 mg | ORAL_TABLET | Freq: Once | ORAL | Status: DC
  Filled 2022-02-27: qty 1

## 2022-02-27 MED ORDER — PLASMA-LYTE A IV SOLN
INTRAVENOUS | Status: DC | PRN
  Administered 2022-02-27: 500 mL

## 2022-02-27 MED ORDER — ASPIRIN 325 MG PO TBEC
325.0000 mg | DELAYED_RELEASE_TABLET | Freq: Every day | ORAL | Status: DC
  Administered 2022-02-28 – 2022-03-05 (×6): 325 mg via ORAL
  Filled 2022-02-27 (×6): qty 1

## 2022-02-27 MED ORDER — PROPOFOL 10 MG/ML IV BOLUS
INTRAVENOUS | Status: AC
  Filled 2022-02-27: qty 20

## 2022-02-27 MED ORDER — CEFAZOLIN SODIUM-DEXTROSE 2-4 GM/100ML-% IV SOLN
2.0000 g | Freq: Three times a day (TID) | INTRAVENOUS | Status: AC
  Administered 2022-02-27 – 2022-03-01 (×6): 2 g via INTRAVENOUS
  Filled 2022-02-27 (×6): qty 100

## 2022-02-27 MED ORDER — ALBUMIN HUMAN 5 % IV SOLN: INTRAVENOUS | Status: DC | PRN

## 2022-02-27 MED ORDER — ASPIRIN 81 MG PO CHEW: 324.0000 mg | CHEWABLE_TABLET | Freq: Every day | ORAL | Status: DC

## 2022-02-27 MED ORDER — THROMBIN 5000 UNITS EX SOLR
OROMUCOSAL | Status: DC | PRN
  Administered 2022-02-27 (×3): 4 mL via TOPICAL

## 2022-02-27 MED ORDER — CHLORHEXIDINE GLUCONATE 0.12 % MT SOLN
15.0000 mL | Freq: Once | OROMUCOSAL | Status: AC
  Administered 2022-02-27 (×2): 15 mL via OROMUCOSAL
  Filled 2022-02-27: qty 15

## 2022-02-27 MED ORDER — PROPOFOL 10 MG/ML IV BOLUS
INTRAVENOUS | Status: DC | PRN
  Administered 2022-02-27: 50 mg via INTRAVENOUS
  Administered 2022-02-27: 20 mg via INTRAVENOUS

## 2022-02-27 MED ORDER — POTASSIUM CHLORIDE 10 MEQ/50ML IV SOLN
10.0000 meq | INTRAVENOUS | Status: AC
  Administered 2022-02-27 (×3): 10 meq via INTRAVENOUS

## 2022-02-27 MED ORDER — ACETAMINOPHEN 650 MG RE SUPP
650.0000 mg | Freq: Once | RECTAL | Status: AC
  Administered 2022-02-27: 650 mg via RECTAL

## 2022-02-27 MED ORDER — DEXMEDETOMIDINE HCL IN NACL 400 MCG/100ML IV SOLN
0.0000 ug/kg/h | INTRAVENOUS | Status: DC
  Administered 2022-02-27: 0.7 ug/kg/h via INTRAVENOUS
  Filled 2022-02-27: qty 100

## 2022-02-27 MED ORDER — OXYCODONE HCL 5 MG PO TABS
5.0000 mg | ORAL_TABLET | ORAL | Status: DC | PRN
  Administered 2022-02-27: 10 mg via ORAL
  Administered 2022-02-28: 5 mg via ORAL
  Administered 2022-02-28: 10 mg via ORAL
  Administered 2022-02-28 (×2): 5 mg via ORAL
  Administered 2022-02-28 – 2022-03-01 (×3): 10 mg via ORAL
  Filled 2022-02-27 (×2): qty 2
  Filled 2022-02-27 (×2): qty 1
  Filled 2022-02-27 (×3): qty 2
  Filled 2022-02-27: qty 1
  Filled 2022-02-27: qty 2

## 2022-02-27 MED ORDER — MIDAZOLAM HCL 2 MG/2ML IJ SOLN
2.0000 mg | INTRAMUSCULAR | Status: DC | PRN
Start: 2022-02-27 — End: 2022-02-28
  Administered 2022-02-27: 2 mg via INTRAVENOUS
  Filled 2022-02-27: qty 2

## 2022-02-27 MED ORDER — ~~LOC~~ CARDIAC SURGERY, PATIENT & FAMILY EDUCATION
Freq: Once | Status: DC
  Filled 2022-02-27: qty 1

## 2022-02-27 MED ORDER — THROMBIN 20000 UNITS EX SOLR
CUTANEOUS | Status: DC | PRN
  Administered 2022-02-27: 20000 [IU] via TOPICAL

## 2022-02-27 MED ORDER — SODIUM CHLORIDE 0.9% FLUSH: 3.0000 mL | INTRAVENOUS | Status: DC | PRN

## 2022-02-27 MED ORDER — METOPROLOL TARTRATE 5 MG/5ML IV SOLN
2.5000 mg | INTRAVENOUS | Status: DC | PRN
  Administered 2022-03-04: 5 mg via INTRAVENOUS
  Filled 2022-02-27: qty 5

## 2022-02-27 MED ORDER — PHENYLEPHRINE 80 MCG/ML (10ML) SYRINGE FOR IV PUSH (FOR BLOOD PRESSURE SUPPORT)
PREFILLED_SYRINGE | INTRAVENOUS | Status: AC
  Filled 2022-02-27: qty 10

## 2022-02-27 MED ORDER — VANCOMYCIN HCL IN DEXTROSE 1-5 GM/200ML-% IV SOLN
1000.0000 mg | Freq: Once | INTRAVENOUS | Status: AC
  Administered 2022-02-27: 1000 mg via INTRAVENOUS
  Filled 2022-02-27: qty 200

## 2022-02-27 MED ORDER — BISACODYL 5 MG PO TBEC
10.0000 mg | DELAYED_RELEASE_TABLET | Freq: Every day | ORAL | Status: DC
Start: 2022-02-28 — End: 2022-03-05
  Administered 2022-02-28 – 2022-03-02 (×3): 10 mg via ORAL
  Filled 2022-02-27 (×4): qty 2

## 2022-02-27 MED ORDER — FAMOTIDINE IN NACL 20-0.9 MG/50ML-% IV SOLN
20.0000 mg | Freq: Two times a day (BID) | INTRAVENOUS | Status: AC
  Administered 2022-02-27 – 2022-02-28 (×2): 20 mg via INTRAVENOUS
  Filled 2022-02-27 (×2): qty 50

## 2022-02-27 MED ORDER — SODIUM CHLORIDE (PF) 0.9 % IJ SOLN
INTRAMUSCULAR | Status: AC
Start: 2022-02-27 — End: ?
  Filled 2022-02-27: qty 20

## 2022-02-27 MED ORDER — METOPROLOL TARTRATE 25 MG/10 ML ORAL SUSPENSION
12.5000 mg | Freq: Two times a day (BID) | ORAL | Status: DC
Start: 2022-02-27 — End: 2022-03-03
  Filled 2022-02-27 (×4): qty 5

## 2022-02-27 MED ORDER — PHENYLEPHRINE 80 MCG/ML (10ML) SYRINGE FOR IV PUSH (FOR BLOOD PRESSURE SUPPORT)
PREFILLED_SYRINGE | INTRAVENOUS | Status: DC | PRN
  Administered 2022-02-27 (×2): 80 ug via INTRAVENOUS

## 2022-02-27 MED ORDER — DOCUSATE SODIUM 100 MG PO CAPS
200.0000 mg | ORAL_CAPSULE | Freq: Every day | ORAL | Status: DC
  Administered 2022-02-28 – 2022-03-05 (×5): 200 mg via ORAL
  Filled 2022-02-27 (×6): qty 2

## 2022-02-27 MED ORDER — PROTAMINE SULFATE 10 MG/ML IV SOLN
INTRAVENOUS | Status: AC
  Filled 2022-02-27: qty 25

## 2022-02-27 MED ORDER — ACETAMINOPHEN 160 MG/5ML PO SOLN
1000.0000 mg | Freq: Four times a day (QID) | ORAL | Status: AC
  Filled 2022-02-27: qty 40.6

## 2022-02-27 MED ORDER — CHLORHEXIDINE GLUCONATE 4 % EX LIQD: 30.0000 mL | CUTANEOUS | Status: DC

## 2022-02-27 MED ORDER — ORAL CARE MOUTH RINSE: 15.0000 mL | OROMUCOSAL | Status: DC | PRN

## 2022-02-27 MED ORDER — CHLORHEXIDINE GLUCONATE 0.12 % MT SOLN
15.0000 mL | OROMUCOSAL | Status: AC
  Administered 2022-02-27: 15 mL via OROMUCOSAL
  Filled 2022-02-27: qty 15

## 2022-02-27 MED ORDER — INSULIN REGULAR(HUMAN) IN NACL 100-0.9 UT/100ML-% IV SOLN: INTRAVENOUS | Status: DC

## 2022-02-27 MED ORDER — SODIUM CHLORIDE 0.9% FLUSH
10.0000 mL | Freq: Two times a day (BID) | INTRAVENOUS | Status: DC
  Administered 2022-02-27 – 2022-03-01 (×4): 10 mL

## 2022-02-27 MED ORDER — ARTIFICIAL TEARS OPHTHALMIC OINT
TOPICAL_OINTMENT | OPHTHALMIC | Status: DC | PRN
  Administered 2022-02-27: 1 via OPHTHALMIC

## 2022-02-27 MED ORDER — SODIUM CHLORIDE 0.9 % IV SOLN: INTRAVENOUS | Status: DC

## 2022-02-27 MED ORDER — DEXTROSE 50 % IV SOLN: 0.0000 mL | INTRAVENOUS | Status: DC | PRN

## 2022-02-27 MED ORDER — PROTAMINE SULFATE 10 MG/ML IV SOLN
INTRAVENOUS | Status: DC | PRN
  Administered 2022-02-27: 280 mg via INTRAVENOUS

## 2022-02-27 MED ORDER — ONDANSETRON HCL 4 MG/2ML IJ SOLN
4.0000 mg | Freq: Four times a day (QID) | INTRAMUSCULAR | Status: DC | PRN
  Administered 2022-02-28 – 2022-03-01 (×2): 4 mg via INTRAVENOUS
  Filled 2022-02-27 (×2): qty 2

## 2022-02-27 SURGICAL SUPPLY — 135 items
ADAPTER CARDIO PERF ANTE/RETRO (ADAPTER) ×5 IMPLANT
ADH SKN CLS APL DERMABOND .7 (GAUZE/BANDAGES/DRESSINGS) ×3
ADPR PRFSN 84XANTGRD RTRGD (ADAPTER) ×3
BAG DECANTER FOR FLEXI CONT (MISCELLANEOUS) ×5 IMPLANT
BLADE CLIPPER SURG (BLADE) ×5 IMPLANT
BLADE STERNUM SYSTEM 6 (BLADE) ×5 IMPLANT
BLADE SURG 15 STRL LF DISP TIS (BLADE) ×4 IMPLANT
BLADE SURG 15 STRL SS (BLADE) ×8
BNDG ELASTIC 4X5.8 VLCR STR LF (GAUZE/BANDAGES/DRESSINGS) ×6 IMPLANT
BNDG ELASTIC 6X5.8 VLCR STR LF (GAUZE/BANDAGES/DRESSINGS) ×5 IMPLANT
BNDG GAUZE DERMACEA FLUFF (GAUZE/BANDAGES/DRESSINGS) ×2
BNDG GAUZE DERMACEA FLUFF 4 (GAUZE/BANDAGES/DRESSINGS) IMPLANT
BNDG GAUZE ELAST 4 BULKY (GAUZE/BANDAGES/DRESSINGS) ×5 IMPLANT
BNDG GZE DERMACEA 4 6PLY (GAUZE/BANDAGES/DRESSINGS) ×6
CANISTER SUCT 3000ML PPV (MISCELLANEOUS) ×5 IMPLANT
CANNULA GUNDRY RCSP 15FR (MISCELLANEOUS) ×5 IMPLANT
CATH HEART VENT LEFT (CATHETERS) ×4 IMPLANT
CATH ROBINSON RED A/P 18FR (CATHETERS) ×15 IMPLANT
CATH THORACIC 28FR (CATHETERS) ×5 IMPLANT
CATH THORACIC 36FR (CATHETERS) ×5 IMPLANT
CATH THORACIC 36FR RT ANG (CATHETERS) ×5 IMPLANT
CLIP FOGARTY SPRING 6M (CLIP) IMPLANT
CLIP VESOCCLUDE MED 24/CT (CLIP) IMPLANT
CLIP VESOCCLUDE SM WIDE 24/CT (CLIP) IMPLANT
CNTNR URN SCR LID CUP LEK RST (MISCELLANEOUS) ×4 IMPLANT
CONT SPEC 4OZ STRL OR WHT (MISCELLANEOUS) ×8
CONTAINER PROTECT SURGISLUSH (MISCELLANEOUS) ×11 IMPLANT
COUNTER NEEDLE 20 DBL MAG RED (NEEDLE) ×1 IMPLANT
COVER MAYO STAND STRL (DRAPES) ×5 IMPLANT
COVER SURGICAL LIGHT HANDLE (MISCELLANEOUS) ×5 IMPLANT
CUFF TOURN SGL QUICK 18X4 (TOURNIQUET CUFF) IMPLANT
CUFF TOURN SGL QUICK 24 (TOURNIQUET CUFF)
CUFF TRNQT CYL 24X4X16.5-23 (TOURNIQUET CUFF) IMPLANT
DERMABOND ADVANCED (GAUZE/BANDAGES/DRESSINGS) ×1
DERMABOND ADVANCED .7 DNX12 (GAUZE/BANDAGES/DRESSINGS) IMPLANT
DEVICE SUT CK QUICK LOAD MINI (Prosthesis & Implant Heart) ×1 IMPLANT
DRAPE CARDIOVASCULAR INCISE (DRAPES) ×4
DRAPE EXTREMITY T 121X128X90 (DISPOSABLE) ×5 IMPLANT
DRAPE HALF SHEET 40X57 (DRAPES) ×6 IMPLANT
DRAPE SRG 135X102X78XABS (DRAPES) ×4 IMPLANT
DRAPE WARM FLUID 44X44 (DRAPES) ×5 IMPLANT
DRSG COVADERM 4X14 (GAUZE/BANDAGES/DRESSINGS) ×5 IMPLANT
ELECT CAUTERY BLADE 6.4 (BLADE) ×5 IMPLANT
ELECT REM PT RETURN 9FT ADLT (ELECTROSURGICAL) ×8
ELECTRODE REM PT RTRN 9FT ADLT (ELECTROSURGICAL) ×8 IMPLANT
FELT TEFLON 1X6 (MISCELLANEOUS) ×10 IMPLANT
GAUZE 4X4 16PLY ~~LOC~~+RFID DBL (SPONGE) ×5 IMPLANT
GAUZE SPONGE 4X4 12PLY STRL (GAUZE/BANDAGES/DRESSINGS) ×11 IMPLANT
GEL ULTRASOUND 20GR AQUASONIC (MISCELLANEOUS) ×5 IMPLANT
GLOVE BIO SURGEON STRL SZ 6 (GLOVE) ×5 IMPLANT
GLOVE BIO SURGEON STRL SZ 6.5 (GLOVE) IMPLANT
GLOVE BIO SURGEON STRL SZ7 (GLOVE) IMPLANT
GLOVE BIO SURGEON STRL SZ7.5 (GLOVE) IMPLANT
GLOVE BIOGEL PI IND STRL 6 (GLOVE) IMPLANT
GLOVE BIOGEL PI IND STRL 6.5 (GLOVE) IMPLANT
GLOVE BIOGEL PI IND STRL 7.0 (GLOVE) IMPLANT
GLOVE BIOGEL PI INDICATOR 6 (GLOVE) ×4
GLOVE BIOGEL PI INDICATOR 6.5 (GLOVE) ×4
GLOVE BIOGEL PI INDICATOR 7.0 (GLOVE)
GLOVE ORTHO TXT STRL SZ7.5 (GLOVE) IMPLANT
GLOVE SURG MICRO LTX SZ7 (GLOVE) ×10 IMPLANT
GOWN STRL REUS W/ TWL LRG LVL3 (GOWN DISPOSABLE) ×16 IMPLANT
GOWN STRL REUS W/ TWL XL LVL3 (GOWN DISPOSABLE) ×4 IMPLANT
GOWN STRL REUS W/TWL LRG LVL3 (GOWN DISPOSABLE) ×16
GOWN STRL REUS W/TWL XL LVL3 (GOWN DISPOSABLE) ×4
HEMOSTAT POWDER SURGIFOAM 1G (HEMOSTASIS) ×15 IMPLANT
HEMOSTAT SURGICEL 2X14 (HEMOSTASIS) ×5 IMPLANT
INSERT FOGARTY 61MM (MISCELLANEOUS) IMPLANT
INSERT FOGARTY XLG (MISCELLANEOUS) ×1 IMPLANT
IV CATH 18G X1.75 CATHLON (IV SOLUTION) ×1 IMPLANT
KIT BASIN OR (CUSTOM PROCEDURE TRAY) ×5 IMPLANT
KIT CATH CPB BARTLE (MISCELLANEOUS) ×5 IMPLANT
KIT SUCTION CATH 14FR (SUCTIONS) ×5 IMPLANT
KIT SUT CK MINI COMBO 4X17 (Prosthesis & Implant Heart) ×1 IMPLANT
KIT TURNOVER KIT B (KITS) ×5 IMPLANT
KIT VASOVIEW HEMOPRO 2 VH 4000 (KITS) ×5 IMPLANT
LINE VENT (MISCELLANEOUS) ×1 IMPLANT
NS IRRIG 1000ML POUR BTL (IV SOLUTION) ×30 IMPLANT
PACK E OPEN HEART (SUTURE) ×5 IMPLANT
PACK OPEN HEART (CUSTOM PROCEDURE TRAY) ×5 IMPLANT
PAD ARMBOARD 7.5X6 YLW CONV (MISCELLANEOUS) ×10 IMPLANT
PAD DEFIB R2 (MISCELLANEOUS) ×5 IMPLANT
PAD ELECT DEFIB RADIOL ZOLL (MISCELLANEOUS) ×5 IMPLANT
PENCIL BUTTON HOLSTER BLD 10FT (ELECTRODE) ×5 IMPLANT
PENCIL SMOKE EVACUATOR (MISCELLANEOUS) ×1 IMPLANT
POSITIONER HEAD DONUT 9IN (MISCELLANEOUS) ×5 IMPLANT
PUNCH AORTIC ROTATE 4.0MM (MISCELLANEOUS) IMPLANT
PUNCH AORTIC ROTATE 4.5MM 8IN (MISCELLANEOUS) ×5 IMPLANT
PUNCH AORTIC ROTATE 5MM 8IN (MISCELLANEOUS) IMPLANT
SEALANT SURG COSEAL 8ML (VASCULAR PRODUCTS) ×1 IMPLANT
SET MPS 3-ND DEL (MISCELLANEOUS) ×1 IMPLANT
SHEARS HARMONIC 9CM CVD (BLADE) ×5 IMPLANT
SPONGE INTESTINAL PEANUT (DISPOSABLE) IMPLANT
SPONGE T-LAP 18X18 ~~LOC~~+RFID (SPONGE) ×24 IMPLANT
SPONGE T-LAP 4X18 ~~LOC~~+RFID (SPONGE) ×10 IMPLANT
SUT BONE WAX W31G (SUTURE) ×5 IMPLANT
SUT EB EXC GRN/WHT 2-0 V-5 (SUTURE) ×10 IMPLANT
SUT ETHIBON EXCEL 2-0 V-5 (SUTURE) ×2 IMPLANT
SUT ETHIBOND V-5 VALVE (SUTURE) ×2 IMPLANT
SUT PROLENE 3 0 SH DA (SUTURE) IMPLANT
SUT PROLENE 3 0 SH1 36 (SUTURE) ×6 IMPLANT
SUT PROLENE 4 0 RB 1 (SUTURE) ×20
SUT PROLENE 4-0 RB1 .5 CRCL 36 (SUTURE) ×12 IMPLANT
SUT PROLENE 5 0 C 1 36 (SUTURE) ×1 IMPLANT
SUT PROLENE 6 0 C 1 30 (SUTURE) ×2 IMPLANT
SUT PROLENE 7 0 BV 1 (SUTURE) ×2 IMPLANT
SUT PROLENE 7 0 BV1 MDA (SUTURE) ×5 IMPLANT
SUT PROLENE 8 0 BV175 6 (SUTURE) ×1 IMPLANT
SUT PROLENE BLUE 7 0 (SUTURE) IMPLANT
SUT SILK 2 0 SH (SUTURE) IMPLANT
SUT SILK 2 0 SH CR/8 (SUTURE) ×1 IMPLANT
SUT SILK 4 0 TIE 10X30 (SUTURE) ×1 IMPLANT
SUT STEEL 6MS V (SUTURE) IMPLANT
SUT STEEL STERNAL CCS#1 18IN (SUTURE) ×1 IMPLANT
SUT STEEL SZ 6 DBL 3X14 BALL (SUTURE) ×2 IMPLANT
SUT VIC AB 1 CTX 36 (SUTURE) ×12
SUT VIC AB 1 CTX36XBRD ANBCTR (SUTURE) ×8 IMPLANT
SUT VIC AB 2-0 CT1 27 (SUTURE) ×8
SUT VIC AB 2-0 CT1 TAPERPNT 27 (SUTURE) IMPLANT
SUT VIC AB 3-0 SH 27 (SUTURE)
SUT VIC AB 3-0 SH 27X BRD (SUTURE) IMPLANT
SUT VIC AB 3-0 X1 27 (SUTURE) ×7 IMPLANT
SYSTEM SAHARA CHEST DRAIN ATS (WOUND CARE) ×5 IMPLANT
TAPE CLOTH SURG 4X10 WHT LF (GAUZE/BANDAGES/DRESSINGS) ×1 IMPLANT
TAPE PAPER 2X10 WHT MICROPORE (GAUZE/BANDAGES/DRESSINGS) ×1 IMPLANT
TOWEL GREEN STERILE (TOWEL DISPOSABLE) ×5 IMPLANT
TOWEL GREEN STERILE FF (TOWEL DISPOSABLE) ×5 IMPLANT
TRAY FOLEY SLVR 16FR TEMP STAT (SET/KITS/TRAYS/PACK) ×5 IMPLANT
TUBE CONNECTING 20X1/4 (TUBING) ×1 IMPLANT
TUBING LAP HI FLOW INSUFFLATIO (TUBING) ×5 IMPLANT
UNDERPAD 30X36 HEAVY ABSORB (UNDERPADS AND DIAPERS) ×5 IMPLANT
VALVE AORTIC SZ21 INSP/RESIL (Valve) ×1 IMPLANT
VENT LEFT HEART 12002 (CATHETERS) ×4
WATER STERILE IRR 1000ML POUR (IV SOLUTION) ×10 IMPLANT
YANKAUER SUCT BULB TIP NO VENT (SUCTIONS) ×1 IMPLANT

## 2022-02-27 NOTE — Anesthesia Procedure Notes (Signed)
Central Venous Catheter Insertion Performed by: Nolon Nations, MD, anesthesiologist Start/End7/13/2023 6:55 AM, 02/27/2022 7:15 AM Patient location: Pre-op. Preanesthetic checklist: patient identified, IV checked, site marked, risks and benefits discussed, surgical consent, monitors and equipment checked, pre-op evaluation, timeout performed and anesthesia consent Position: Trendelenburg Lidocaine 1% used for infiltration and patient sedated Hand hygiene performed  and maximum sterile barriers used  Catheter size: 9 Fr MAC introducer Procedure performed using ultrasound guided technique. Ultrasound Notes:anatomy identified, needle tip was noted to be adjacent to the nerve/plexus identified, no ultrasound evidence of intravascular and/or intraneural injection and image(s) printed for medical record Attempts: 1 Following insertion, line sutured, dressing applied and Biopatch. Post procedure assessment: blood return through all ports, free fluid flow and no air  Patient tolerated the procedure well with no immediate complications.

## 2022-02-27 NOTE — Transfer of Care (Signed)
Immediate Anesthesia Transfer of Care Note  Patient: Samuel Cook  Procedure(s) Performed: REDO CORONARY ARTERY BYPASS GRAFTING (CABG) X3 USING LEFT RADIAL ARTERY AND LEFT GREATER SAPHENOUS VEIN, HARVESTED ENDOSCOPICALLY AORTIC VALVE REPLACEMENT (AVR) USING 21 INSPIRIS (Chest) RADIAL ARTERY HARVEST (Left: Arm Lower) TRANSESOPHAGEAL ECHOCARDIOGRAM (TEE)  Patient Location: PACU and SICU  Anesthesia Type:General  Level of Consciousness: Patient remains intubated per anesthesia plan  Airway & Oxygen Therapy: Patient remains intubated per anesthesia plan  Post-op Assessment: Post -op Vital signs reviewed and stable  Post vital signs: Reviewed and stable  Last Vitals:  Vitals Value Taken Time  BP 92/49 02/27/22 1700  Temp 36.1 C 02/27/22 1701  Pulse 80 02/27/22 1701  Resp 12 02/27/22 1701  SpO2 97 % 02/27/22 1701  Vitals shown include unvalidated device data.  Last Pain:  Vitals:   02/27/22 0606  TempSrc:   PainSc: 0-No pain         Complications: No notable events documented.

## 2022-02-27 NOTE — Interval H&P Note (Signed)
History and Physical Interval Note:  02/27/2022 6:55 AM  Samuel Cook  has presented today for surgery, with the diagnosis of CAD, AS.  The various methods of treatment have been discussed with the patient and family. After consideration of risks, benefits and other options for treatment, the patient has consented to  Procedure(s): REDO CORONARY ARTERY BYPASS GRAFTING (CABG) (N/A) AORTIC VALVE REPLACEMENT (AVR) (N/A) RADIAL ARTERY HARVEST (Left) TRANSESOPHAGEAL ECHOCARDIOGRAM (TEE) (N/A) as a surgical intervention.  The patient's history has been reviewed, patient examined, no change in status, stable for surgery.  I have reviewed the patient's chart and labs.  Questions were answered to the patient's satisfaction.     [name]

## 2022-02-27 NOTE — Op Note (Signed)
CARDIOVASCULAR SURGERY OPERATIVE NOTE  02/27/2022  Surgeon:  [name], MD  First Assistant: Gershon Crane,  [facility]:   An experienced assistant was required given the complexity of this surgery and the standard of surgical care. The assistant was needed for radial artery and endoscopic vein harvest, exposure, dissection, suctioning, retraction of delicate tissues and sutures, instrument exchange and for overall help during this procedure.   Preoperative Diagnosis:  Severe multi-vessel coronary artery disease, severe aortic stenosis and moderate aortic insufficiency   Postoperative Diagnosis:  Same   Procedure:  Redo Median Sternotomy Extracorporeal circulation 3.   Redo Coronary artery bypass grafting x 3  Left radial artery graft to the OM SVG to Ramus SVG to PDA (LCX)  4.   Endoscopic vein harvest from the left leg 5.   Harvest of left radial artery pedicle graft 6.   Aortic valve replacement using a 21 mm Edwards INSPIRIS RESILIA pericardial valve.   Anesthesia:  General Endotracheal   Clinical History/Surgical Indication:  This 66 year old gentleman has stage D, severe, symptomatic aortic stenosis with New York Heart Association class II-lll symptoms of exertional fatigue and shortness of breath consistent with chronic diastolic congestive heart failure.  His cardiac catheterization showed high-grade stenoses involving the distal left main and large dominant left circumflex system with occlusion of the previous vein grafts to the obtuse marginal and PDA off the left circumflex.  I agree that redo CABG and aortic valve replacement using a bioprosthetic valve is the best treatment for this patient. His preop arterial dopplers show that his hands are not dependent on the radial arteries. Vein mapping shows that the left GSV appears to be present and adequate sized. I reviewed the echo and  catheterization films with the patient and his wife.  I discussed the operative procedure with the patient and his wife including alternatives, benefits and risks; including but not limited to bleeding, blood transfusion, infection, stroke, myocardial infarction, graft failure, heart block requiring a permanent pacemaker, organ dysfunction, and death.  [name] understands and agrees to proceed.    Preparation:  The patient was seen in the preoperative holding area and the correct patient, correct operation were confirmed with the patient after reviewing the medical record and catheterization. The consent was signed by me. Preoperative antibiotics were given. A pulmonary arterial line and radial arterial line were placed by the anesthesia team. The patient was taken back to the operating room and positioned supine on the operating room table. After being placed under general endotracheal anesthesia by the anesthesia team a foley catheter was placed. The left arm was placed out on an arm board. The neck, chest, abdomen, left arm and both legs were prepped with betadine soap and solution and draped in the usual sterile manner. A surgical time-out was taken and the correct patient and operative procedure were confirmed with the nursing and anesthesia staff.   Left radial artery harvest:  The left radial artery was exposed through a longitudinal incision over the artery. The distal artery at the wrist was mobilized and occluded with an atraumatic clamp. There was still a good doppler signal in the palmar arch as noted on the preop doppler exam. The artery was mobilized by dividing the branches with the harmonic scalpel. The larger branches were clipped. The proximal and distal ends were divided and the stumps suture ligated with 2-0 silk. The artery was flushed with heparin/papaverine solution. Hemostasis was achieved. The incision was closed in layers with 3-0 Vicryl subcuticular  skin closure. A gauze  dressing was applied followed by Kerlix and an Ace Wrap. The arm was repositioned at his side.    Endoscopic vein harvest:  The left greater saphenous vein was harvested endoscopically through a 2 cm incision medial to the left knee. It was harvested from the upper thigh to below the knee. It was a medium-sized vein of good quality. The side branches were all ligated with 4-0 silk ties.   Cardiopulmonary Bypass:  A redo median sternotomy was performed. The pericardium was opened in the midline. Dissection of the pericardial adhesions was performed to expose the right atrium and ascending aorta.  The ascending aorta was of normal size and had no palpable plaque. There were no contraindications to aortic cannulation or cross-clamping. The patient was fully systemically heparinized and the ACT was maintained > 400 sec. The proximal aortic arch was cannulated with a 20 F aortic cannula for arterial inflow. Venous cannulation was performed via the right atrial appendage using a two-staged venous cannula. An antegrade cardioplegia/vent cannula was inserted into the mid-ascending aorta. A retrograde cardioplegia cannula was inserted into the coronary sinus via the right atrium. A left ventricular vent was placed via the right superior pulmonary vein. He was placed on cardiopulmonary bypass and the remainder of the pericardial adhesions were dissected. The left internal mammary graft was identified as it entered the pericardium and was clamped with an atraumatic vascular bulldog. Aortic occlusion was performed with a single cross-clamp. Systemic cooling to 28 degrees Centigrade and topical cooling of the heart with iced saline were used. Cold KBC retrograde cardioplegia was used to induce diastolic arrest and was then given at about 60 minute intervals throughout the period of arrest to maintain myocardial temperature at or below 10 degrees centigrade. A temperature probe was inserted into the interventricular  septum and an insulating pad was placed in the pericardium   Coronary arteries:  The OM was deep intramyocardial and took some time to find.   Ramus:  intramyocardial but easily found. It was a medium caliber vessel with no distal disease. OM:  The OM was large but difficult to expose due to its intramyocardial location and was a very tedious anastomosis. PDA:  small but graftable just beyond the old graft insertion site. Then it became intramyocardial and was lying beneath a vein. The other distal branches of the LCX were small.   Grafts:  Left radial to the OM: 2.0 mm. It was sewn end to side using 8-0 prolene continuous suture. SVG to Ramus:  1.6 mm. It was sewn end to side using 7-0 prolene continuous suture. SVG to PDA (LCX):  1.5 mm. It was sewn end to side using 7-0 prolene continuous suture.   The proximal vein graft anastomoses were performed to the mid-ascending aorta using continuous 6-0 prolene suture. The proximal radial artery anastomosis was performed to the hood of the Ramus vein graft since the aorta was thick. Graft markers were placed around the proximal anastomoses.   Aortic Valve Replacement:   A transverse aortotomy was performed 1 cm above the take-off of the right coronary artery. The native valve was tri-leaflet with calcified leaflets and mild annular calcification. The leaflets were very thick and appeared rheumatic. The ostia of the coronary arteries were in normal position and were calcified and visibly narrowed. The native valve leaflets were excised and the annulus was decalcified with rongeurs. Care was taken to remove all particulate debris. The left ventricle was directly inspected for debris and  then irrigated with ice saline solution. The annulus was sized and a size 21 mm INSPIRIS RESILIA pericardial valve was chosen. The model number was [id number] and the serial number was [id number]. While the valve was being prepared 2-0 Ethibond pledgeted horizontal mattress  sutures were placed around the annulus with the pledgets in a sub-annular position. The sutures were placed through the sewing ring and the valve lowered into place. The sutures were tied using CorKnots. The valve seated nicely and the coronary ostia were not obstructed. The prosthetic valve leaflets moved normally and there was no sub-valvular obstruction. The aortotomy was closed using 4-0 Prolene suture in 2 layers with felt strips to reinforce the closure.  Completion:  The patient was rewarmed to 37 degrees Centigrade. De-airing maneuvers were performed and the head placed in trendelenburg position. The crossclamp was removed with a time of 250 minutes. There was spontaneous return of sinus rhythm. The aortotomy was checked for hemostasis. Two temporary epicardial pacing wires were placed on the right atrium and two on the right ventricle. The left ventricular vent and retrograde cardioplegia cannulas were removed. The patient was weaned from CPB without difficulty on no inotropes. CPB time was 282 minutes. Cardiac output was 5 LPM. TEE showed normal LV systolic function and a normally functioning aortic valve prosthesis with no AI or paravalvular leak. Heparin was fully reversed with protamine and the aortic and venous cannulas removed. Hemostasis was achieved. He was given 1 unit of plts and 2 units of FFP for coagulopathy. Mediastinal drainage tubes were placed. The sternum was closed with double #6 stainless steel wires. The fascia was closed with continuous # 1 vicryl suture. The subcutaneous tissue was closed with 2-0 vicryl continuous suture. The skin was closed with 3-0 vicryl subcuticular suture. All sponge, needle, and instrument counts were reported correct at the end of the case. Dry sterile dressings were placed over the incisions and around the chest tubes which were connected to pleurevac suction. The patient was then transported to the surgical intensive care unit in stable condition.     [name], MD

## 2022-02-27 NOTE — Anesthesia Procedure Notes (Signed)
Central Venous Catheter Insertion Performed by: Lewie Loron, MD, anesthesiologist Start/End7/13/2023 7:15 AM, 02/27/2022 7:25 AM Patient location: Pre-op. Preanesthetic checklist: patient identified, IV checked, site marked, risks and benefits discussed, surgical consent, monitors and equipment checked, pre-op evaluation, timeout performed and anesthesia consent Lidocaine 1% used for infiltration and patient sedated Hand hygiene performed  and maximum sterile barriers used  Catheter size: 9 Fr PA cath was placed.Sheath introducer Swan type:thermodilution PA Cath depth:50 Procedure performed using ultrasound guided technique. Ultrasound Notes:anatomy identified, needle tip was noted to be adjacent to the nerve/plexus identified, no ultrasound evidence of intravascular and/or intraneural injection and image(s) printed for medical record Attempts: 1 Following insertion, line sutured and dressing applied. Post procedure assessment: blood return through all ports, free fluid flow and no air  Patient tolerated the procedure well with no immediate complications.

## 2022-02-27 NOTE — Brief Op Note (Addendum)
02/27/2022  8:37 AM  PATIENT:  Samuel Cook  66 y.o. male  PRE-OPERATIVE DIAGNOSIS:  CAD, AS  POST-OPERATIVE DIAGNOSIS:  * No post-op diagnosis entered *  PROCEDURE:  Procedure(s): REDO CORONARY ARTERY BYPASS GRAFTING (CABG) X3 USING LEFT RADIAL ARTERY AND LEFT GREATER SAPHENOUS VEIN, HARVESTED ENDOSCOPICALLY (N/A) AORTIC VALVE REPLACEMENT (AVR) USING 21 INSPIRIS (N/A) RADIAL ARTERY HARVEST (Left) TRANSESOPHAGEAL ECHOCARDIOGRAM (TEE) (N/A) Vein harvest time: Vein prep time: Radial (left) harvest time 50 min  prep time 10 min  SURGEON:  Surgeon(s) and Role:    * Bartle, Payton Doughty, MD - Primary  PHYSICIAN ASSISTANT: Leauna Sharber PA-C  ASSISTANTS: RNFA   ANESTHESIA:   none  EBL:  900 mL   BLOOD ADMINISTERED:platelets and FFP  DRAINS:  LEFT PLEURAL AND MEDIASTINAL CHEST TUBES    LOCAL MEDICATIONS USED:  NONE  SPECIMEN:  Source of Specimen:  AORTIC VALVE LEAFLETS  DISPOSITION OF SPECIMEN:  PATHOLOGY  COUNTS:  YES  TOURNIQUET:  * No tourniquets in log *  DICTATION: .Dragon Dictation  PLAN OF CARE: Admit to inpatient   PATIENT DISPOSITION:  ICU - intubated and hemodynamically stable.   Delay start of Pharmacological VTE agent (>24hrs) due to surgical blood loss or risk of bleeding: yes  COMPLICATIONS: NO KNOWN

## 2022-02-27 NOTE — Anesthesia Procedure Notes (Signed)
Procedure Name: Intubation Date/Time: 02/27/2022 7:57 AM  Performed by: Anastasio Auerbach, CRNAPre-anesthesia Checklist: Patient identified, Emergency Drugs available, Suction available and Patient being monitored Patient Re-evaluated:Patient Re-evaluated prior to induction Oxygen Delivery Method: Circle system utilized Preoxygenation: Pre-oxygenation with 100% oxygen Induction Type: IV induction Ventilation: Mask ventilation without difficulty Laryngoscope Size: Mac and 4 Grade View: Grade II Tube type: Oral Number of attempts: 1 Airway Equipment and Method: Stylet and Oral airway Placement Confirmation: ETT inserted through vocal cords under direct vision, positive ETCO2 and breath sounds checked- equal and bilateral Secured at: 23 cm Tube secured with: Tape Dental Injury: Teeth and Oropharynx as per pre-operative assessment

## 2022-02-27 NOTE — Anesthesia Procedure Notes (Signed)
Arterial Line Insertion Start/End7/13/2023 6:50 AM, 02/27/2022 6:55 AM Performed by: Lewie Loron, MD, Cheree Ditto, CRNA, CRNA  Patient location: Pre-op. Preanesthetic checklist: patient identified, IV checked, site marked, risks and benefits discussed, surgical consent, monitors and equipment checked, pre-op evaluation, timeout performed and anesthesia consent Lidocaine 1% used for infiltration Right, radial was placed Catheter size: 20 G Hand hygiene performed , maximum sterile barriers used  and Seldinger technique used Allen's test indicative of satisfactory collateral circulation Attempts: 1 Procedure performed without using ultrasound guided technique. Following insertion, dressing applied and Biopatch. Post procedure assessment: normal  Patient tolerated the procedure well with no immediate complications.

## 2022-02-27 NOTE — Procedures (Signed)
Extubation Procedure Note  Patient Details:   Name: MAHAD NEWSTROM DOB: [birth year] MRN: [id number]   Airway Documentation:    Vent end date: 02/27/22 Vent end time: 2310   Evaluation  O2 sats: stable throughout Complications: No apparent complications Patient did tolerate procedure well. Bilateral Breath Sounds: Rhonchi   Yes  NIF -40 VC 1.94L performed. Pt extubated to 4L [place]. Pt able to follow commands.   Rutha Bouchard 02/27/2022, 11:23 PM

## 2022-02-27 NOTE — Progress Notes (Signed)
  Echocardiogram Echocardiogram Transesophageal has been performed.  Janalyn Harder 02/27/2022, 8:11 AM

## 2022-02-27 NOTE — Hospital Course (Addendum)
HPI:   The patient is a 66 year old gentleman with a history of hypertension, hyperlipidemia, coronary artery disease status post CABG x3 by Dr. Tyrone Sage in 12/2016 (LIMA to LAD, SVG to OM, SVG to PDA), and aortic stenosis that has been followed by Dr. Eden Emms.  An echocardiogram in January 2022 showed an increase in the mean gradient from 19 mm year before to 30 mmHg with a valve area of 1.1 cm.  There was mild aortic insufficiency.  His most recent echo on 09/26/2021 showed further increase in the mean gradient to 38.5 mmHg with peak gradient of 61.2 mmHg.  Aortic valve area was 0.94 cm.  There was mild aortic insufficiency.  Left ventricular ejection fraction remains 55 to 60%.  He subsequent underwent a gated cardiac CTA which showed a severely calcified trileaflet aortic valve with restricted leaflet mobility.  The ascending aorta was measured at 3.4 cm with the sinotubular junction of 2.6 cm.  Cardiac catheterization was performed on 01/24/2022 and showed a patent left internal mammary graft to the LAD.  There is severe proximal LAD stenosis.  The mid and distal LAD and diagonal branch filled by the LIMA graft.  There was a severe calcified stenosis at the ostium of a large dominant left circumflex involving the ostium of a moderate-sized intermediate branch.  There is severe ostial stenosis of a moderate caliber obtuse marginal branch.  The vein grafts to the obtuse marginal branch and the PDA off the left circumflex were occluded.  Right heart pressures were normal.   The patient lives with his wife.  He denies any chest discomfort but has been having exertional shortness of breath and fatigue with occasional episodes of positional dizziness.  He denies any peripheral edema.  He has had no orthopnea or PND.    Hospital course:  The patient was admitted electively and on 02/27/2022 taken to the operating room at which time he underwent redo median sternotomy for CABG x3 and aortic valve  replacement.  He tolerated the procedure well and was taken to the surgical intensive care unit in stable condition.  Postoperative hospital course: As dictated by Gershon Crane PA-C: The patient has done well.  He has maintained stable hemodynamics.  He does have a chronic left bundle branch block and is maintaining sinus rhythm..  He was weaned from the ventilator without difficulty using standard cardiac postsurgical protocols.  He has been started on Norvasc for radial artery harvest.  Blood sugars have been under under good control using standard post cardiac surgical protocols.  He does have an expected postoperative volume overload and is responding well to diuretics.  All routine lines, monitors and drainage devices have been discontinued in the standard fashion.  Oxygen has been weaned and he maintains good saturations on room air.  Incisions are healing well without evidence of infection.  His left hand is neurovascularly intact.  Does have expected acute blood loss anemia but this is stabilized and is not in the transfusion threshold.   Addendum: Patient was weaned to room air. He has been tolerating a diet and has had a bowel movement. Epicardial pacing wires were removed on 07/17. He did have minor sero sanguinous drainage from mid sternal incision. There is no sign of wound infection, however. The sternal drainage did stop. LUE and LLE wounds are clean, dry, and well healed. He is below pre op weight so will not require further diuresis. He was felt surgically stable for discharge ton 07/18;however, he  went into atrial fibrillation with RVR so his discharge was held. He was put on an Amiodarone drip. He converted to sinus rhythm and was transitioned to oral Amiodarone. Lopressor was also increased to 50 mg bid.  Chest tube sutures will be removed in the office after discharge. Per Dr. Laneta Simmers, patient is stable for discharge today.

## 2022-02-28 ENCOUNTER — Inpatient Hospital Stay (HOSPITAL_COMMUNITY): Payer: Medicare HMO

## 2022-02-28 DIAGNOSIS — J9811 Atelectasis: Secondary | ICD-10-CM | POA: Diagnosis not present

## 2022-02-28 LAB — POCT I-STAT 7, (LYTES, BLD GAS, ICA,H+H)
Acid-base deficit: 3 mmol/L — ABNORMAL HIGH (ref 0.0–2.0)
Acid-base deficit: 3 mmol/L — ABNORMAL HIGH (ref 0.0–2.0)
Bicarbonate: 23.2 mmol/L (ref 20.0–28.0)
Bicarbonate: 23.9 mmol/L (ref 20.0–28.0)
Calcium, Ion: 1.19 mmol/L (ref 1.15–1.40)
Calcium, Ion: 1.25 mmol/L (ref 1.15–1.40)
HCT: 27 % — ABNORMAL LOW (ref 39.0–52.0)
HCT: 28 % — ABNORMAL LOW (ref 39.0–52.0)
Hemoglobin: 9.2 g/dL — ABNORMAL LOW (ref 13.0–17.0)
Hemoglobin: 9.5 g/dL — ABNORMAL LOW (ref 13.0–17.0)
O2 Saturation: 92 %
O2 Saturation: 94 %
Patient temperature: 36.5
Patient temperature: 36.6
Potassium: 4.1 mmol/L (ref 3.5–5.1)
Potassium: 4.2 mmol/L (ref 3.5–5.1)
Sodium: 138 mmol/L (ref 135–145)
Sodium: 139 mmol/L (ref 135–145)
TCO2: 25 mmol/L (ref 22–32)
TCO2: 25 mmol/L (ref 22–32)
pCO2 arterial: 45.9 mmHg (ref 32–48)
pCO2 arterial: 48.1 mmHg — ABNORMAL HIGH (ref 32–48)
pH, Arterial: 7.301 — ABNORMAL LOW (ref 7.35–7.45)
pH, Arterial: 7.31 — ABNORMAL LOW (ref 7.35–7.45)
pO2, Arterial: 71 mmHg — ABNORMAL LOW (ref 83–108)
pO2, Arterial: 78 mmHg — ABNORMAL LOW (ref 83–108)

## 2022-02-28 LAB — BPAM CRYOPRECIPITATE
Blood Product Expiration Date: 202307140110
Blood Product Expiration Date: 202307140110
ISSUE DATE / TIME: 202307131958
ISSUE DATE / TIME: 202307132032
Unit Type and Rh: 6200
Unit Type and Rh: 6200

## 2022-02-28 LAB — PREPARE CRYOPRECIPITATE
Unit division: 0
Unit division: 0

## 2022-02-28 LAB — GLUCOSE, CAPILLARY
Glucose-Capillary: 114 mg/dL — ABNORMAL HIGH (ref 70–99)
Glucose-Capillary: 119 mg/dL — ABNORMAL HIGH (ref 70–99)
Glucose-Capillary: 124 mg/dL — ABNORMAL HIGH (ref 70–99)
Glucose-Capillary: 128 mg/dL — ABNORMAL HIGH (ref 70–99)
Glucose-Capillary: 134 mg/dL — ABNORMAL HIGH (ref 70–99)
Glucose-Capillary: 135 mg/dL — ABNORMAL HIGH (ref 70–99)
Glucose-Capillary: 136 mg/dL — ABNORMAL HIGH (ref 70–99)
Glucose-Capillary: 137 mg/dL — ABNORMAL HIGH (ref 70–99)
Glucose-Capillary: 138 mg/dL — ABNORMAL HIGH (ref 70–99)
Glucose-Capillary: 139 mg/dL — ABNORMAL HIGH (ref 70–99)
Glucose-Capillary: 139 mg/dL — ABNORMAL HIGH (ref 70–99)
Glucose-Capillary: 150 mg/dL — ABNORMAL HIGH (ref 70–99)
Glucose-Capillary: 158 mg/dL — ABNORMAL HIGH (ref 70–99)

## 2022-02-28 LAB — BASIC METABOLIC PANEL
Anion gap: 8 (ref 5–15)
Anion gap: 3 — ABNORMAL LOW (ref 5–15)
Anion gap: 10 (ref 5–15)
BUN: 18 mg/dL (ref 8–23)
BUN: 17 mg/dL (ref 8–23)
BUN: 17 mg/dL (ref 8–23)
CO2: 22 mmol/L (ref 22–32)
CO2: 25 mmol/L (ref 22–32)
CO2: 26 mmol/L (ref 22–32)
Calcium: 7.9 mg/dL — ABNORMAL LOW (ref 8.9–10.3)
Calcium: 7.7 mg/dL — ABNORMAL LOW (ref 8.9–10.3)
Calcium: 7.8 mg/dL — ABNORMAL LOW (ref 8.9–10.3)
Chloride: 106 mmol/L (ref 98–111)
Chloride: 109 mmol/L (ref 98–111)
Chloride: 99 mmol/L (ref 98–111)
Creatinine, Ser: 1.11 mg/dL (ref 0.61–1.24)
Creatinine, Ser: 1.06 mg/dL (ref 0.61–1.24)
Creatinine, Ser: 1.24 mg/dL (ref 0.61–1.24)
GFR, Estimated: >60 mL/min (ref >60)
GFR, Estimated: >60 mL/min (ref >60)
GFR, Estimated: >60 mL/min (ref >60)
Glucose, Bld: 141 mg/dL — ABNORMAL HIGH (ref 70–99)
Glucose, Bld: 121 mg/dL — ABNORMAL HIGH (ref 70–99)
Glucose, Bld: 155 mg/dL — ABNORMAL HIGH (ref 70–99)
Potassium: 4.2 mmol/L (ref 3.5–5.1)
Potassium: 4.2 mmol/L (ref 3.5–5.1)
Potassium: 3.5 mmol/L (ref 3.5–5.1)
Sodium: 136 mmol/L (ref 135–145)
Sodium: 137 mmol/L (ref 135–145)
Sodium: 135 mmol/L (ref 135–145)

## 2022-02-28 LAB — BPAM FFP
Blood Product Expiration Date: 202307162359
Blood Product Expiration Date: 202307162359
ISSUE DATE / TIME: 202307131543
ISSUE DATE / TIME: 202307131543
Unit Type and Rh: 600
Unit Type and Rh: 600

## 2022-02-28 LAB — CBC
HCT: 29.7 % — ABNORMAL LOW (ref 39.0–52.0)
HCT: 27.3 % — ABNORMAL LOW (ref 39.0–52.0)
HCT: 26 % — ABNORMAL LOW (ref 39.0–52.0)
Hemoglobin: 10.2 g/dL — ABNORMAL LOW (ref 13.0–17.0)
Hemoglobin: 9.1 g/dL — ABNORMAL LOW (ref 13.0–17.0)
Hemoglobin: 8.8 g/dL — ABNORMAL LOW (ref 13.0–17.0)
MCH: 34.5 pg — ABNORMAL HIGH (ref 26.0–34.0)
MCH: 34.2 pg — ABNORMAL HIGH (ref 26.0–34.0)
MCH: 35.3 pg — ABNORMAL HIGH (ref 26.0–34.0)
MCHC: 34.3 g/dL (ref 30.0–36.0)
MCHC: 33.3 g/dL (ref 30.0–36.0)
MCHC: 33.8 g/dL (ref 30.0–36.0)
MCV: 100.3 fL — ABNORMAL HIGH (ref 80.0–100.0)
MCV: 102.6 fL — ABNORMAL HIGH (ref 80.0–100.0)
MCV: 104.4 fL — ABNORMAL HIGH (ref 80.0–100.0)
Platelets: 117 10*3/uL — ABNORMAL LOW (ref 150–400)
Platelets: 105 10*3/uL — ABNORMAL LOW (ref 150–400)
Platelets: 96 10*3/uL — ABNORMAL LOW (ref 150–400)
RBC: 2.96 MIL/uL — ABNORMAL LOW (ref 4.22–5.81)
RBC: 2.66 MIL/uL — ABNORMAL LOW (ref 4.22–5.81)
RBC: 2.49 MIL/uL — ABNORMAL LOW (ref 4.22–5.81)
RDW: 12 % (ref 11.5–15.5)
RDW: 12.1 % (ref 11.5–15.5)
RDW: 12.3 % (ref 11.5–15.5)
WBC: 11 10*3/uL — ABNORMAL HIGH (ref 4.0–10.5)
WBC: 11.4 10*3/uL — ABNORMAL HIGH (ref 4.0–10.5)
WBC: 13.9 10*3/uL — ABNORMAL HIGH (ref 4.0–10.5)
nRBC: 0 % (ref 0.0–0.2)
nRBC: 0 % (ref 0.0–0.2)
nRBC: 0 % (ref 0.0–0.2)

## 2022-02-28 LAB — PREPARE FRESH FROZEN PLASMA: Unit division: 0

## 2022-02-28 LAB — BPAM PLATELET PHERESIS
Blood Product Expiration Date: 202307142359
ISSUE DATE / TIME: 202307131424
Unit Type and Rh: 6200

## 2022-02-28 LAB — SURGICAL PATHOLOGY

## 2022-02-28 LAB — PREPARE PLATELET PHERESIS: Unit division: 0

## 2022-02-28 LAB — MAGNESIUM
Magnesium: 2.5 mg/dL — ABNORMAL HIGH (ref 1.7–2.4)
Magnesium: 2.2 mg/dL (ref 1.7–2.4)

## 2022-02-28 MED ORDER — INSULIN ASPART 100 UNIT/ML IJ SOLN
0.0000 [IU] | INTRAMUSCULAR | Status: DC
  Administered 2022-02-28 – 2022-03-01 (×4): 2 [IU] via SUBCUTANEOUS

## 2022-02-28 MED ORDER — ORAL CARE MOUTH RINSE: 15.0000 mL | OROMUCOSAL | Status: DC | PRN

## 2022-02-28 MED ORDER — FUROSEMIDE 10 MG/ML IJ SOLN
40.0000 mg | Freq: Two times a day (BID) | INTRAMUSCULAR | Status: AC
  Administered 2022-02-28 (×2): 40 mg via INTRAVENOUS
  Filled 2022-02-28 (×2): qty 4

## 2022-02-28 MED ORDER — ENOXAPARIN SODIUM 40 MG/0.4ML IJ SOSY
40.0000 mg | PREFILLED_SYRINGE | Freq: Every day | INTRAMUSCULAR | Status: DC
  Administered 2022-02-28 – 2022-03-04 (×5): 40 mg via SUBCUTANEOUS
  Filled 2022-02-28 (×5): qty 0.4

## 2022-02-28 MED ORDER — INSULIN DETEMIR 100 UNIT/ML ~~LOC~~ SOLN
25.0000 [IU] | Freq: Every day | SUBCUTANEOUS | Status: DC
Start: 2022-02-28 — End: 2022-03-03
  Administered 2022-02-28 – 2022-03-02 (×3): 25 [IU] via SUBCUTANEOUS
  Filled 2022-02-28 (×4): qty 0.25

## 2022-02-28 MED ORDER — AMLODIPINE BESYLATE 5 MG PO TABS
2.5000 mg | ORAL_TABLET | Freq: Every day | ORAL | Status: DC
  Administered 2022-02-28 – 2022-03-05 (×6): 2.5 mg via ORAL
  Filled 2022-02-28 (×6): qty 1

## 2022-02-28 MED ORDER — POTASSIUM CHLORIDE 10 MEQ/50ML IV SOLN
10.0000 meq | INTRAVENOUS | Status: AC
  Administered 2022-02-28 (×2): 10 meq via INTRAVENOUS
  Filled 2022-02-28 (×3): qty 50

## 2022-02-28 MED FILL — Potassium Chloride Inj 2 mEq/ML: INTRAVENOUS | Qty: 20 | Status: AC

## 2022-02-28 MED FILL — Heparin Sodium (Porcine) Inj 1000 Unit/ML: Qty: 1000 | Status: AC

## 2022-02-28 MED FILL — Lidocaine HCl Local Preservative Free (PF) Inj 2%: INTRAMUSCULAR | Qty: 15 | Status: AC

## 2022-02-28 NOTE — Anesthesia Postprocedure Evaluation (Signed)
Anesthesia Post Note  Patient: Samuel Cook  Procedure(s) Performed: REDO CORONARY ARTERY BYPASS GRAFTING (CABG) X3 USING LEFT RADIAL ARTERY AND LEFT GREATER SAPHENOUS VEIN, HARVESTED ENDOSCOPICALLY AORTIC VALVE REPLACEMENT (AVR) USING 21 INSPIRIS (Chest) RADIAL ARTERY HARVEST (Left: Arm Lower) TRANSESOPHAGEAL ECHOCARDIOGRAM (TEE)     Patient location during evaluation: SICU Anesthesia Type: General Level of consciousness: sedated and patient remains intubated per anesthesia plan Pain management: pain level controlled Vital Signs Assessment: post-procedure vital signs reviewed and stable Respiratory status: patient remains intubated per anesthesia plan and patient on ventilator - see flowsheet for VS Cardiovascular status: stable Anesthetic complications: no   No notable events documented.  Last Vitals:  Vitals:   02/28/22 0600 02/28/22 0630  BP: (!) 106/58   Pulse: 89 89  Resp: 11 11  Temp: 36.8 C 36.6 C  SpO2: 94% 95%    Last Pain:  Vitals:   02/28/22 0644  TempSrc:   PainSc: 6                  Lewie Loron

## 2022-02-28 NOTE — Progress Notes (Signed)
1 Day Post-Op Procedure(s) (LRB): REDO CORONARY ARTERY BYPASS GRAFTING (CABG) X3 USING LEFT RADIAL ARTERY AND LEFT GREATER SAPHENOUS VEIN, HARVESTED ENDOSCOPICALLY (N/A) AORTIC VALVE REPLACEMENT (AVR) USING 21 INSPIRIS (N/A) RADIAL ARTERY HARVEST (Left) TRANSESOPHAGEAL ECHOCARDIOGRAM (TEE) (N/A) Subjective: No complaints  Objective: Vital signs in last 24 hours: Temp:  [96.1 F (35.6 C)-99 F (37.2 C)] 97.9 F (36.6 C) (07/14 0630) Pulse Rate:  [52-90] 89 (07/14 0630) Cardiac Rhythm: Atrial paced (07/14 0500) Resp:  [11-20] 11 (07/14 0630) BP: (92-126)/(49-90) 106/58 (07/14 0600) SpO2:  [90 %-100 %] 95 % (07/14 0630) Arterial Line BP: (91-149)/(43-84) 124/43 (07/14 0630) FiO2 (%):  [40 %-50 %] 40 % (07/13 2236) Weight:  [94.4 kg] 94.4 kg (07/14 0500)  Hemodynamic parameters for last 24 hours: PAP: (12-46)/(-20-23) 33/15 CVP:  [7 mmHg-31 mmHg] 10 mmHg CO:  [3.2 L/min-7.4 L/min] 7.4 L/min CI:  [1.6 L/min/m2-3.7 L/min/m2] 3.7 L/min/m2  Intake/Output from previous day: 07/13 0701 - 07/14 0700 In: 7239.6 [P.O.:225; I.V.:3188.8; Blood:1617.7; NG/GT:60; IV Piggyback:2148.1] Out: 6410 [Urine:4740; Emesis/NG output:50; Blood:900; Chest Tube:720] Intake/Output this shift: No intake/output data recorded.  General appearance: alert and cooperative Neurologic: intact Heart: regular rate and rhythm, S1, S2 normal, no murmur Lungs: clear to auscultation bilaterally Extremities: edema mild Wound: dressings dry  Lab Results: Recent Labs    02/27/22 2300 02/28/22 0014 02/28/22 0300  WBC 11.0*  --  11.4*  HGB 10.2* 9.2* 9.1*  HCT 29.7* 27.0* 27.3*  PLT 117*  --  105*   BMET:  Recent Labs    02/27/22 2300 02/28/22 0014 02/28/22 0300  NA 136 138 137  K 4.2 4.1 4.2  CL 106  --  109  CO2 22  --  25  GLUCOSE 141*  --  121*  BUN 18  --  17  CREATININE 1.11  --  1.06  CALCIUM 7.9*  --  7.7*    PT/INR:  Recent Labs    02/27/22 1717  LABPROT 18.9*  INR 1.6*   ABG     Component Value Date/Time   PHART 7.301 (L) 02/28/2022 0014   HCO3 23.9 02/28/2022 0014   TCO2 25 02/28/2022 0014   ACIDBASEDEF 3.0 (H) 02/28/2022 0014   O2SAT 92 02/28/2022 0014   CBG (last 3)  Recent Labs    02/28/22 0302 02/28/22 0410 02/28/22 0513  GLUCAP 139* 138* 135*   CXR: clear  ECG: sinus, old LBBB  Assessment/Plan: S/P Procedure(s) (LRB): REDO CORONARY ARTERY BYPASS GRAFTING (CABG) X3 USING LEFT RADIAL ARTERY AND LEFT GREATER SAPHENOUS VEIN, HARVESTED ENDOSCOPICALLY (N/A) AORTIC VALVE REPLACEMENT (AVR) USING 21 INSPIRIS (N/A) RADIAL ARTERY HARVEST (Left) TRANSESOPHAGEAL ECHOCARDIOGRAM (TEE) (N/A)  POD 1 Hemodynamically stable in sinus rhythm with chronic LBBB. Continue low dose Lopressor.  Start Norvasc 2.5 for radial graft and titrate up to 5 mg as BP allows.  DM: No meds preop with Hgb A1c 6.2. Transition to Levemir and SSI.  Volume excess: Wt is 20 lbs over preop. Start diuresis.  Will keep chest tubes in for now since he was coagulopathic and oozy postop.  DC swan, arterial line.  IS, OOB.  Plan ASA and Plavix at discharge.   LOS: 1 day    [name] 02/28/2022

## 2022-02-28 NOTE — Discharge Summary (Addendum)
[address]       [place] [address]             [phone]    Physician Discharge Summary  Patient ID: Samuel Cook MRN: [id number] DOB/AGE: 10-25-1955 66 y.o.  Admit date: 02/27/2022 Discharge date: 03/05/2022  Admission Diagnoses:  Patient Active Problem List   Diagnosis Date Noted   S/P AVR (aortic valve replacement) 02/27/2022   Severe aortic stenosis    Unstable angina (HCC)    Elevated hemoglobin (HCC) 09/03/2017   Hyperglycemia 02/16/2017   Anxiety disorder 02/04/2017   S/P CABG x 3 12/26/2016   Abnormal nuclear stress test 12/12/2016   Aortic stenosis 11/03/2016   Essential hypertension 10/20/2016   Hyperlipidemia 10/20/2016   Murmur 10/20/2016     Discharge Diagnoses:  Patient Active Problem List   Diagnosis Date Noted   S/P AVR (aortic valve replacement) Post op atrial fibrillation 02/27/2022   Severe aortic stenosis    Unstable angina (HCC)    Elevated hemoglobin (HCC) 09/03/2017   Hyperglycemia 02/16/2017   Anxiety disorder 02/04/2017   S/P CABG x 3 12/26/2016   Abnormal nuclear stress test 12/12/2016   Aortic stenosis 11/03/2016   Essential hypertension 10/20/2016   Hyperlipidemia 10/20/2016   Murmur 10/20/2016     Discharged Condition: stable          HPI:   The patient is a 66 year old gentleman with a history of hypertension, hyperlipidemia, coronary artery disease status post CABG x3 by Dr. Tyrone Sage in 12/2016 (LIMA to LAD, SVG to OM, SVG to PDA), and aortic stenosis that has been followed by Dr. Eden Emms.  An echocardiogram in January 2022 showed an increase in the mean gradient from 19 mm year before to 30 mmHg with a valve area of 1.1 cm.  There was mild aortic insufficiency.  His most recent echo on 09/26/2021 showed further increase in the mean gradient to 38.5 mmHg with peak gradient of 61.2 mmHg.  Aortic valve area was 0.94 cm.  There was mild aortic insufficiency.  Left ventricular ejection fraction remains 55  to 60%.  He subsequent underwent a gated cardiac CTA which showed a severely calcified trileaflet aortic valve with restricted leaflet mobility.  The ascending aorta was measured at 3.4 cm with the sinotubular junction of 2.6 cm.  Cardiac catheterization was performed on 01/24/2022 and showed a patent left internal mammary graft to the LAD.  There is severe proximal LAD stenosis.  The mid and distal LAD and diagonal branch filled by the LIMA graft.  There was a severe calcified stenosis at the ostium of a large dominant left circumflex involving the ostium of a moderate-sized intermediate branch.  There is severe ostial stenosis of a moderate caliber obtuse marginal branch.  The vein grafts to the obtuse marginal branch and the PDA off the left circumflex were occluded.  Right heart pressures were normal.   The patient lives with his wife.  He denies any chest discomfort but has been having exertional shortness of breath and fatigue with occasional episodes of positional dizziness.  He denies any peripheral edema.  He has had no orthopnea or PND.    Hospital course:  The patient was admitted electively and on 02/27/2022 taken to the operating room at which time he underwent redo median sternotomy for CABG x3 and aortic valve replacement.  He tolerated the procedure well and was taken to the surgical intensive care unit in stable condition.  Postoperative hospital course:  As dictated by Gershon Crane PA-C: The patient has done well.  He has maintained stable hemodynamics.  He does have a chronic left bundle branch block and is maintaining sinus rhythm..  He was weaned from the ventilator without difficulty using standard cardiac postsurgical protocols.  He has been started on Norvasc for radial artery harvest.  Blood sugars have been under under good control using standard post cardiac surgical protocols.  He does have an expected postoperative volume overload and is responding well to diuretics.  All routine  lines, monitors and drainage devices have been discontinued in the standard fashion.  Oxygen has been weaned and he maintains good saturations on room air.  Incisions are healing well without evidence of infection.  His left hand is neurovascularly intact.  Does have expected acute blood loss anemia but this is stabilized and is not in the transfusion threshold.   Addendum: Patient was weaned to room air. He has been tolerating a diet and has had a bowel movement. Epicardial pacing wires were removed on 07/17. He did have minor sero sanguinous drainage from mid sternal incision. There is no sign of wound infection, however. The sternal drainage did stop. LUE and LLE wounds are clean, dry, and well healed. He is below pre op weight so will not require further diuresis. He is felt surgically stable for discharge today. Consults: None  Significant Diagnostic Studies:  DG CHEST PORT 1 VIEW  Result Date: 03/02/2022 CLINICAL DATA:  Postop from CABG. EXAM: PORTABLE CHEST 1 VIEW COMPARISON:  03/01/2022 FINDINGS: Right jugular Cordis and mediastinal drains have been removed. No pneumothorax visualized. Mild bibasilar atelectasis shows improvement since previous study. No new or worsening areas of pulmonary opacity are seen. Heart size is stable. IMPRESSION: Mild improvement in bibasilar atelectasis. Electronically Signed   By: Danae Orleans M.D.   On: 03/02/2022 09:12   DG Chest Port 1 View  Result Date: 03/01/2022 CLINICAL DATA:  Recent CABG re-evaluate lung status. EXAM: PORTABLE CHEST 1 VIEW COMPARISON:  Portable chest yesterday at 5:23 a.m. FINDINGS: 5:24 a.m. Interval removal Swan-Ganz catheter with right IJ introducer sheath left in place with tip at the brachiocephalic/SVC junction. Two mediastinal drains remain in place.  No pneumothorax is seen. Small pneumomediastinum is less well seen today but still present. CABG changes are again noted, scattered atelectatic bands in the bases. No consolidation is  seen or significant pleural effusion. IMPRESSION: No interval change in the overall aeration and postsurgical findings. No focal pneumonia is evident. Bibasilar atelectatic bands persist. Electronically Signed   By: Almira Bar M.D.   On: 03/01/2022 07:14   DG Chest Port 1 View  Result Date: 02/28/2022 CLINICAL DATA:  Post AVR, chest tubes, sore chest today EXAM: PORTABLE CHEST 1 VIEW COMPARISON:  Portable exam 0523 hours compared to 02/27/2022 FINDINGS: Interval removal of endotracheal and nasogastric tubes. RIGHT jugular Swan-Ganz catheter with tip projecting over proximal LEFT pulmonary artery. Mediastinal drains and epicardial pacing wires present. Enlargement of cardiac silhouette post AVR. Small amount of pneumomediastinum consistent with preceding surgery and chest tubes. Bibasilar atelectasis. No infiltrate, pleural effusion, or pneumothorax. IMPRESSION: Bibasilar atelectasis. Electronically Signed   By: Ulyses Southward M.D.   On: 02/28/2022 08:08   ECHO INTRAOPERATIVE TEE  Result Date: 02/27/2022  *INTRAOPERATIVE TRANSESOPHAGEAL REPORT *  Patient Name:   Samuel Cook Date of Exam: 02/27/2022 Medical Rec #:  [id number]    Height:       70.0 in Accession #:    [id number]  Weight:       188.0 lb Date of Birth:  03-09-1956   BSA:          2.03 m Patient Age:    18 years     BP:           139/57 mmHg Patient Gender: M            HR:           48 bpm. Exam Location:  Inpatient Transesophogeal exam was perform intraoperatively during surgical procedure. Patient was closely monitored under general anesthesia during the entirety of examination. Indications:     I25.700 Atherosclerosis of coronary artery bypass graft(s),                  unspecified, with unstable angina pectoris; I35.0 Nonrheumatic                  aortic (valve) stenosis Performing Phys: [id number] BARTLE Diagnosing Phys: Nolon Nations MD Complications: No known complications during this procedure. POST-OP IMPRESSIONS _ Left Ventricle:  The left ventricle is unchanged from pre-bypass. _ Right Ventricle: The right ventricle appears unchanged from pre-bypass. _ Aorta: The aorta appears unchanged from pre-bypass. _ Left Atrial Appendage: The left atrial appendage appears unchanged from pre-bypass. _ Aortic Valve: Mild stenosis present. A bioprosthetic valve was placed, leaflets are freely mobile and leaflets thin. There is no regurgitation. Normal washing jets for valve type. No perivalvular leak noted.Mean gradient 10mmHg. _ Mitral Valve: The mitral valve appears unchanged from pre-bypass. _ Tricuspid Valve: The tricuspid valve appears unchanged from pre-bypass. _ Pulmonic Valve: The pulmonic valve appears unchanged from pre-bypass. _ Interatrial Septum: The interatrial septum appears unchanged from pre-bypass. PRE-OP FINDINGS  Left Ventricle: The left ventricle has normal systolic function, with an ejection fraction of 55-60%. The cavity size was normal. There is no left ventricular hypertrophy. Right Ventricle: The right ventricle has normal systolic function. The cavity was normal. There is no increase in right ventricular wall thickness. Left Atrium: Left atrial size was normal in size. No left atrial/left atrial appendage thrombus was detected. The left atrial appendage is well visualized and there is no evidence of thrombus present. Right Atrium: Right atrial size was normal in size. Interatrial Septum: No atrial level shunt detected by color flow Doppler. There is no evidence of a patent foramen ovale. Pericardium: There is no evidence of pericardial effusion. There is pleural effusion in the right lateral region. Mitral Valve: The mitral valve is normal in structure. Mitral valve regurgitation is mild by color flow Doppler. There is no evidence of mitral valve vegetation. Tricuspid Valve: The tricuspid valve was normal in structure. Tricuspid valve regurgitation is trivial by color flow Doppler. There is no evidence of tricuspid valve  vegetation. Aortic Valve: The aortic valve is tricuspid Aortic valve regurgitation is moderate by color flow Doppler. The jet is centrally-directed. There is moderate stenosis of the aortic valve. There is severe thickening and severe calcifcation present on the aortic valve non-coronary cusp with severely decreased mobility and there is moderate thickening and moderate calcification present on the aortic valve left coronary cusp with moderately decreased mobility and there is moderate thickening and moderate calcification present on the aortic valve right coronary cusp with moderately decreased mobility. Pulmonic Valve: The pulmonic valve was normal in structure. Pulmonic valve regurgitation is trivial by color flow Doppler. Aorta: There is evidence of plaque in the descending aorta; Grade II, measuring 2-26mm in size. Shunts: There is no  evidence of an atrial septal defect. +-------------+---------++ AORTIC VALVE           +-------------+---------++ AV Mean Grad:25.0 mmHg +-------------+---------++  Nolon Nations MD Electronically signed by Nolon Nations MD Signature Date/Time: 02/27/2022/6:23:26 PM    Final    DG Chest Port 1 View  Result Date: 02/27/2022 CLINICAL DATA:  Post AVR EXAM: PORTABLE CHEST 1 VIEW COMPARISON:  Portable exam 1705 hours compared to 02/25/2022 FINDINGS: Tip of endotracheal tube approximately 2.3 cm above carina. Nasogastric tube extends into abdomen. RIGHT jugular Swan-Ganz catheter tip projects over pulmonary outflow tract. Epicardial pacing leads noted. Enlargement of cardiac silhouette. Mediastinal contours and pulmonary vascularity normal. Bibasilar atelectasis. No infiltrate, pleural effusion, or pneumothorax. IMPRESSION: Postoperative changes with bibasilar atelectasis. Electronically Signed   By: Lavonia Dana M.D.   On: 02/27/2022 17:19   VAS [place] LOWER EXT SAPHENOUS VEIN MAPPING  Result Date: 02/25/2022 LOWER EXTREMITY VEIN MAPPING Patient Name:  Samuel Cook  Date of  Exam:   02/25/2022 Medical Rec #: [id number]     Accession #:    [id number] Date of Birth: 08-12-1956    Patient Gender: M Patient Age:   45 years Exam Location:  [facility] Procedure:      VAS [place] LOWER EXTREMITY SAPHENOUS VEIN MAPPING Referring Phys: Gilford Raid --------------------------------------------------------------------------------  Other Indications:  Precabg workup. Risk Factors:       Hypertension, hyperlipidemia, coronary artery disease. Other Risk Factors: History of CABG x3 in 2018. Right GSV harvested.  Performing Technologist: Oda Cogan RDMS, RVT  Examination Guidelines: A complete evaluation includes B-mode imaging, spectral Doppler, color Doppler, and power Doppler as needed of all accessible portions of each vessel. Bilateral testing is considered an integral part of a complete examination. Limited examinations for reoccurring indications may be performed as noted. +---------------+-----------+----------------------+---------------+-----------+   RT Diameter  RT Findings         GSV            LT Diameter  LT Findings      (cm)                                            (cm)                  +---------------+-----------+----------------------+---------------+-----------+      0.33                     Saphenofemoral         0.42                                                   Junction                                  +---------------+-----------+----------------------+---------------+-----------+      0.10                     Proximal thigh         0.40                  +---------------+-----------+----------------------+---------------+-----------+  Harvested       Mid thigh            0.31                  +---------------+-----------+----------------------+---------------+-----------+                 harvested      Distal thigh          0.32                   +---------------+-----------+----------------------+---------------+-----------+                 harvested          Knee              0.32                  +---------------+-----------+----------------------+---------------+-----------+      0.14                       Prox calf            0.28       branching  +---------------+-----------+----------------------+---------------+-----------+      0.10                        Mid calf            0.25                  +---------------+-----------+----------------------+---------------+-----------+      0.08                      Distal calf           0.26                  +---------------+-----------+----------------------+---------------+-----------+                                   Ankle              0.25                  +---------------+-----------+----------------------+---------------+-----------+ Right Tech Comments: Harvested in 2018 for CABG Diagnosing physician: Servando Snare MD Electronically signed by Servando Snare MD on 02/25/2022 at 2:56:39 PM.    Final    VAS US DOPPLER PRE CABG  Result Date: 02/25/2022 PREOPERATIVE VASCULAR EVALUATION Patient Name:  Samuel Cook  Date of Exam:   02/25/2022 Medical Rec #: [id number]     Accession #:    [id number] Date of Birth: 12-Feb-1956    Patient Gender: M Patient Age:   60 years Exam Location:  [facility] Procedure:      VAS US DOPPLER PRE CABG Referring Phys: Gilford Raid --------------------------------------------------------------------------------  Risk Factors:  Hypertension, hyperlipidemia, past history of smoking, coronary                artery disease. Other Factors: CABG x 3 in 2018. Performing Technologist: Oda Cogan RDMS, RVT  Examination Guidelines: A complete evaluation includes B-mode imaging, spectral Doppler, color Doppler, and power Doppler as needed of all accessible portions of each vessel. Bilateral testing is considered an integral part of a complete  examination. Limited examinations for reoccurring indications may be performed as noted.  Right Carotid Findings: +----------+--------+--------+--------+----------------------+--------+           PSV  cm/sEDV cm/sStenosisDescribe              Comments +----------+--------+--------+--------+----------------------+--------+ CCA Prox  98                                                     +----------+--------+--------+--------+----------------------+--------+ CCA Distal104     11                                             +----------+--------+--------+--------+----------------------+--------+ ICA Prox  88      16      1-39%   calcific and irregular         +----------+--------+--------+--------+----------------------+--------+ ICA Distal78      18                                             +----------+--------+--------+--------+----------------------+--------+ ECA       110                                                    +----------+--------+--------+--------+----------------------+--------+ +----------+--------+-------+----------------+------------+           PSV cm/sEDV cmsDescribe        Arm Pressure +----------+--------+-------+----------------+------------+ [id number]            Multiphasic, WNL             +----------+--------+-------+----------------+------------+ +---------+--------+--+--------+---------+ VertebralPSV cm/s56EDV cm/sAntegrade +---------+--------+--+--------+---------+ Left Carotid Findings: +----------+--------+--------+--------+--------+--------+           PSV cm/sEDV cm/sStenosisDescribeComments +----------+--------+--------+--------+--------+--------+ CCA Prox  74      12                               +----------+--------+--------+--------+--------+--------+ CCA Distal84                                       +----------+--------+--------+--------+--------+--------+ ICA Prox  68      13      1-39%    calcific         +----------+--------+--------+--------+--------+--------+ ICA Distal86      20                               +----------+--------+--------+--------+--------+--------+ ECA       98                                       +----------+--------+--------+--------+--------+--------+ +----------+--------+--------+----------------+------------+ SubclavianPSV cm/sEDV cm/sDescribe        Arm Pressure +----------+--------+--------+----------------+------------+           131             Multiphasic, WNL             +----------+--------+--------+----------------+------------+ +---------+--------+--+--------+--+---------+ VertebralPSV cm/s39EDV cm/s10Antegrade +---------+--------+--+--------+--+---------+  ABI Findings: +--------+------------------+-----+---------+--------+  Right   Rt Pressure (mmHg)IndexWaveform Comment  +--------+------------------+-----+---------+--------+ [id number]                    triphasic         +--------+------------------+-----+---------+--------+ PTA     180               1.26 triphasic         +--------+------------------+-----+---------+--------+ DP      179               1.25 triphasic         +--------+------------------+-----+---------+--------+ +--------+------------------+-----+---------+-------+ Left    Lt Pressure (mmHg)IndexWaveform Comment +--------+------------------+-----+---------+-------+ [id number]                    triphasic        +--------+------------------+-----+---------+-------+ PTA     174               1.22 triphasic        +--------+------------------+-----+---------+-------+ DP      161               1.13 triphasic        +--------+------------------+-----+---------+-------+ +-------+---------------+----------------+ ABI/TBIToday's ABI/TBIPrevious ABI/TBI +-------+---------------+----------------+ Right  1.2                              +-------+---------------+----------------+ Left   1.2                             +-------+---------------+----------------+  Right Doppler Findings: +-----------+--------+-----+---------+--------+ Site       PressureIndexDoppler  Comments +-----------+--------+-----+---------+--------+ Brachial   137          triphasic         +-----------+--------+-----+---------+--------+ Radial                  triphasic         +-----------+--------+-----+---------+--------+ Ulnar                   triphasic         +-----------+--------+-----+---------+--------+ Palmar Arch                      WNL      +-----------+--------+-----+---------+--------+  Left Doppler Findings: +--------+--------+-----+---------+--------+ Site    PressureIndexDoppler  Comments +--------+--------+-----+---------+--------+ [id number]          triphasic         +--------+--------+-----+---------+--------+ Radial               triphasic         +--------+--------+-----+---------+--------+ Ulnar                triphasic         +--------+--------+-----+---------+--------+  Summary: Right Carotid: Velocities in the right ICA are consistent with a 1-39% stenosis. Left Carotid: Velocities in the left ICA are consistent with a 1-39% stenosis. Vertebrals: Bilateral vertebral arteries demonstrate antegrade flow. Right ABI: Resting right ankle-brachial index is within normal range. No evidence of significant right lower extremity arterial disease. Left ABI: Resting left ankle-brachial index is within normal range. No evidence of significant left lower extremity arterial disease. Right Upper Extremity: Doppler waveforms remain within normal limits with right radial compression. Doppler waveforms remain within normal limits with right ulnar compression. Left Upper Extremity: Doppler waveforms remain within normal limits with left radial compression. Doppler waveforms decrease 50% with left ulnar  compression.  Electronically signed by Servando Snare MD on 02/25/2022 at 2:55:44 PM.    Final    DG Chest 2 View  Result Date: 02/25/2022 CLINICAL DATA:  Premed for redo CABG and AVR. Severe aortic stenosis. EXAM: CHEST - 2 VIEW COMPARISON:  02/16/2017 FINDINGS: Heart size is normal. Median sternotomy and CABG. No focal consolidations or pleural effusions. No pulmonary edema. IMPRESSION: No active cardiopulmonary disease. Electronically Signed   By: Nolon Nations M.D.   On: 02/25/2022 11:56     Treatments: surgery:    02/27/2022   Surgeon:  Gaye Pollack, MD   First Assistant: Jadene Pierini,  [facility]:   An experienced assistant was required given the complexity of this surgery and the standard of surgical care. The assistant was needed for radial artery and endoscopic vein harvest, exposure, dissection, suctioning, retraction of delicate tissues and sutures, instrument exchange and for overall help during this procedure.     Preoperative Diagnosis:  Severe multi-vessel coronary artery disease, severe aortic stenosis and moderate aortic insufficiency     Postoperative Diagnosis:  Same     Procedure:   Redo Median Sternotomy Extracorporeal circulation 3.   Redo Coronary artery bypass grafting x 3   Left radial artery graft to the OM SVG to Ramus SVG to PDA (LCX)   4.   Endoscopic vein harvest from the left leg 5.   Harvest of left radial artery pedicle graft 6.   Aortic valve replacement using a 21 mm Edwards INSPIRIS RESILIA pericardial valve.  Discharge Exam: Blood pressure 136/68, pulse 83, temperature 98.2 F (36.8 C), temperature source Oral, resp. rate 15, height 5\' 10"  (1.778 m), weight 84.5 kg, SpO2 94 %. Cardiovascular: RRR, no murmur Pulmonary: Slightly diminished bibasilar breath sounds Abdomen: Soft, non tender, bowel sounds present. Extremities: No lower extremity edema. LUE motor/sensory intact Wounds: Clean and drain. No further drainage from sternal wound. LUE and  LLE wounds are clean and dry   Discharge Medications:  The patient has been discharged on:   1.Beta Blocker:  Yes [ x  ]                              No   [   ]                              If No, reason:  2.Ace Inhibitor/ARB: Yes [   ]                                     No  [ x   ]                                     If No, reason:Hope to start once BP allows after discharge  3.Statin:   Yes [  x ]                  No  [   ]                  If No, reason:  4.Ecasa:  Yes  [ x  ]                  No   [   ]  If No, reason:  Patient had ACS upon admission:No  Plavix/P2Y12 inhibitor: Yes [   ]                                      No  [  x ]     Discharge Instructions     AMB Referral to Cardiac Rehabilitation - Phase II   Complete by: As directed    Diagnosis:  CABG Valve Replacement     Valve: Aortic   CABG X ___: 3   After initial evaluation and assessments completed: Virtual Based Care may be provided alone or in conjunction with Phase 2 Cardiac Rehab based on patient barriers.: Yes      Allergies as of 03/04/2022   No Active Allergies      Medication List     STOP taking these medications    chlorthalidone 25 MG tablet Commonly known as: HYGROTON   lisinopril 20 MG tablet Commonly known as: ZESTRIL   nebivolol 10 MG tablet Commonly known as: BYSTOLIC       TAKE these medications    acetaminophen 500 MG tablet Commonly known as: TYLENOL Take 1,000 mg by mouth 2 (two) times daily.   amLODipine 2.5 MG tablet Commonly known as: NORVASC Take 1 tablet (2.5 mg total) by mouth at bedtime. What changed:  medication strength how much to take   aspirin EC 325 MG tablet Take 1 tablet (325 mg total) by mouth daily. What changed:  medication strength how much to take   escitalopram 10 MG tablet Commonly known as: LEXAPRO Take 10 mg by mouth at bedtime.   LORazepam 0.5 MG tablet Commonly known as: ATIVAN Take 1 tablet (0.5  mg total) by mouth 2 (two) times daily as needed. for anxiety What changed:  when to take this additional instructions   metoprolol tartrate 25 MG tablet Commonly known as: LOPRESSOR Take 1 tablet (25 mg total) by mouth 2 (two) times daily.   rosuvastatin 40 MG tablet Commonly known as: CRESTOR TAKE 1 TABLET BY MOUTH EVERY DAY   traMADol 50 MG tablet Commonly known as: ULTRAM Take 1 tablet (50 mg total) by mouth every 6 (six) hours as needed for moderate pain.        Follow-up Information     Gaye Pollack, MD Follow up.   Specialty: Cardiothoracic Surgery Why: See discharge paperwork for follow-up appointment with surgeon.  On the day you see Dr. Cyndia Bent obtain a chest x-ray from [facility] 1/2-hour prior to the appointment.  It is located in the same office complex. Contact information: [address] [place] [address] [phone]         Elgie Collard, PA-C Follow up.   Specialty: Cardiothoracic Surgery Why: See discharge paperwork for follow-up with cardiology Contact information: [place] [address] [phone]         [facility] [place]. Go on 03/10/2022.   Specialty: Cardiothoracic Surgery Why: Appointment is with nurse only for suture removal. Appointment time is at 10:00 am Contact information: [address] [place], [place] [phone]                Signed:  Nani Skillern, PA-C 03/04/2022, 7:14 AM

## 2022-02-28 NOTE — Progress Notes (Signed)
  Transition of Care [facility]) Screening Note   Patient Details  Name: [name] Date of Birth: [birth year]   Transition of Care [facility]) CM/SW Contact:    Delilah Shan, LCSWA Phone Number: 02/28/2022, 4:09 PM    Transition of Care Department [facility]) has reviewed patient and no TOC needs have been identified at this time. We will continue to monitor patient advancement through interdisciplinary progression rounds. If new patient transition needs arise, please place a TOC consult.

## 2022-03-01 ENCOUNTER — Encounter (HOSPITAL_COMMUNITY): Payer: Self-pay | Admitting: Surgery

## 2022-03-01 ENCOUNTER — Inpatient Hospital Stay (HOSPITAL_COMMUNITY): Payer: Medicare HMO

## 2022-03-01 LAB — BASIC METABOLIC PANEL
Anion gap: 8 (ref 5–15)
BUN: 17 mg/dL (ref 8–23)
CO2: 28 mmol/L (ref 22–32)
Calcium: 8 mg/dL — ABNORMAL LOW (ref 8.9–10.3)
Chloride: 99 mmol/L (ref 98–111)
Creatinine, Ser: 1.14 mg/dL (ref 0.61–1.24)
GFR, Estimated: >60 mL/min (ref >60)
Glucose, Bld: 106 mg/dL — ABNORMAL HIGH (ref 70–99)
Potassium: 3.8 mmol/L (ref 3.5–5.1)
Sodium: 135 mmol/L (ref 135–145)

## 2022-03-01 LAB — GLUCOSE, CAPILLARY
Glucose-Capillary: 123 mg/dL — ABNORMAL HIGH (ref 70–99)
Glucose-Capillary: 124 mg/dL — ABNORMAL HIGH (ref 70–99)
Glucose-Capillary: 148 mg/dL — ABNORMAL HIGH (ref 70–99)
Glucose-Capillary: 153 mg/dL — ABNORMAL HIGH (ref 70–99)
Glucose-Capillary: 184 mg/dL — ABNORMAL HIGH (ref 70–99)
Glucose-Capillary: 99 mg/dL (ref 70–99)

## 2022-03-01 LAB — CBC
HCT: 25.9 % — ABNORMAL LOW (ref 39.0–52.0)
Hemoglobin: 8.6 g/dL — ABNORMAL LOW (ref 13.0–17.0)
MCH: 34.8 pg — ABNORMAL HIGH (ref 26.0–34.0)
MCHC: 33.2 g/dL (ref 30.0–36.0)
MCV: 104.9 fL — ABNORMAL HIGH (ref 80.0–100.0)
Platelets: 86 10*3/uL — ABNORMAL LOW (ref 150–400)
RBC: 2.47 MIL/uL — ABNORMAL LOW (ref 4.22–5.81)
RDW: 12.3 % (ref 11.5–15.5)
WBC: 12.7 10*3/uL — ABNORMAL HIGH (ref 4.0–10.5)
nRBC: 0 % (ref 0.0–0.2)

## 2022-03-01 MED ORDER — SODIUM CHLORIDE 0.9% FLUSH
3.0000 mL | Freq: Two times a day (BID) | INTRAVENOUS | Status: DC
  Administered 2022-03-01 – 2022-03-04 (×7): 3 mL via INTRAVENOUS

## 2022-03-01 MED ORDER — POTASSIUM CHLORIDE CRYS ER 20 MEQ PO TBCR
20.0000 meq | EXTENDED_RELEASE_TABLET | ORAL | Status: DC
  Administered 2022-03-01 (×2): 20 meq via ORAL
  Filled 2022-03-01 (×2): qty 1

## 2022-03-01 MED ORDER — SODIUM CHLORIDE 0.9% FLUSH: 3.0000 mL | INTRAVENOUS | Status: DC | PRN

## 2022-03-01 MED ORDER — METOLAZONE 2.5 MG PO TABS
2.5000 mg | ORAL_TABLET | Freq: Once | ORAL | Status: AC
  Administered 2022-03-01: 2.5 mg via ORAL
  Filled 2022-03-01: qty 1

## 2022-03-01 MED ORDER — ~~LOC~~ CARDIAC SURGERY, PATIENT & FAMILY EDUCATION: Freq: Once | Status: AC

## 2022-03-01 MED ORDER — POTASSIUM CHLORIDE CRYS ER 20 MEQ PO TBCR
40.0000 meq | EXTENDED_RELEASE_TABLET | Freq: Every day | ORAL | Status: DC
  Administered 2022-03-01 – 2022-03-04 (×4): 40 meq via ORAL
  Filled 2022-03-01 (×5): qty 2

## 2022-03-01 MED ORDER — FUROSEMIDE 40 MG PO TABS
40.0000 mg | ORAL_TABLET | Freq: Once | ORAL | Status: AC
  Administered 2022-03-01: 40 mg via ORAL
  Filled 2022-03-01: qty 1

## 2022-03-01 MED ORDER — SODIUM CHLORIDE 0.9 % IV SOLN
250.0000 mL | INTRAVENOUS | Status: DC | PRN
Start: 2022-03-01 — End: 2022-03-05

## 2022-03-01 MED ORDER — FUROSEMIDE 40 MG PO TABS
40.0000 mg | ORAL_TABLET | Freq: Every day | ORAL | Status: DC
  Administered 2022-03-01 – 2022-03-02 (×2): 40 mg via ORAL
  Filled 2022-03-01 (×2): qty 1

## 2022-03-01 NOTE — Progress Notes (Signed)
Patient arrived from 2H to 4E room 4.  CCMD notified.  VSS but is experiencing nausea/vomiting.  Zofran given.

## 2022-03-01 NOTE — Plan of Care (Signed)

## 2022-03-01 NOTE — Progress Notes (Signed)
[address]       [place] [address]             [phone]                 2 Days Post-Op Procedure(s) (LRB): REDO CORONARY ARTERY BYPASS GRAFTING (CABG) X3 USING LEFT RADIAL ARTERY AND LEFT GREATER SAPHENOUS VEIN, HARVESTED ENDOSCOPICALLY (N/A) AORTIC VALVE REPLACEMENT (AVR) USING 21 INSPIRIS (N/A) RADIAL ARTERY HARVEST (Left) TRANSESOPHAGEAL ECHOCARDIOGRAM (TEE) (N/A)   Events: No events _______________________________________________________________ Vitals: BP 108/88   Pulse 73   Temp 97.8 F (36.6 C) (Oral)   Resp 15   Ht 5\' 10"  (1.778 m)   Wt 95.1 kg   SpO2 95%   BMI 30.09 kg/m  Filed Weights   02/27/22 0546 02/28/22 0500 03/01/22 0500  Weight: 85.3 kg 94.4 kg 95.1 kg     - Neuro: alert NAD  - Cardiovascular: sinus  Drips: none.   PAP: (42-53)/(17-26) 48/21  - Pulm: EWOB    ABG    Component Value Date/Time   PHART 7.301 (L) 02/28/2022 0014   PCO2ART 48.1 (H) 02/28/2022 0014   PO2ART 71 (L) 02/28/2022 0014   HCO3 23.9 02/28/2022 0014   TCO2 25 02/28/2022 0014   ACIDBASEDEF 3.0 (H) 02/28/2022 0014   O2SAT 92 02/28/2022 0014    - Abd: ND - Extremity: warm  .Intake/Output      07/14 0701 07/15 0700 07/15 0701 07/16 0700   P.O. 790    I.V. (mL/kg) 656.5 (6.9)    Blood     NG/GT     IV Piggyback 585    Total Intake(mL/kg) 2031.5 (21.4)    Urine (mL/kg/hr) 3860 (1.7) 200 (0.5)   Emesis/NG output 0    Stool 0    Blood     Chest Tube 490 0   Total Output 4350 200   Net -2318.6 -200        Stool Occurrence 0 x    Emesis Occurrence 0 x       _______________________________________________________________ Labs:    Latest Ref Rng & Units 03/01/2022    3:43 AM 02/28/2022    5:26 PM 02/28/2022    3:00 AM  CBC  WBC 4.0 - 10.5 K/uL 12.7  13.9  11.4   Hemoglobin 13.0 - 17.0 g/dL 8.6  8.8  9.1   Hematocrit 39.0 - 52.0 % 25.9  26.0  27.3   Platelets 150 - 400 K/uL 86  96  105       Latest Ref Rng & Units 03/01/2022     3:43 AM 02/28/2022    5:26 PM 02/28/2022    3:00 AM  CMP  Glucose 70 - 99 mg/dL 03/02/2022  683  419   BUN 8 - 23 mg/dL 17  17  17    Creatinine 0.61 - 1.24 mg/dL 622   2.97   Sodium 135 - 145 mmol/L 135  135  137   Potassium 3.5 - 5.1 mmol/L 3.8  3.5  4.2   Chloride 98 - 111 mmol/L 99  99  109   CO2 22 - 32 mmol/L 28  26  25    Calcium 8.9 - 10.3 mg/dL 8.0  7.8  7.7     CXR: stable  _______________________________________________________________  Assessment and Plan: POD 2 s/p redo-sternotomy, bAVR/CABG  Neuro: pain controlled CV: on A/S/BB.  Wires capped Pulm: wean O2 Renal: continue diuresis GI: on diet Heme: stable ID: [email or web address] Endo:  SSI Dispo: 2C   Corliss Skains 03/01/2022 10:58 AM

## 2022-03-02 ENCOUNTER — Inpatient Hospital Stay (HOSPITAL_COMMUNITY): Payer: Medicare HMO

## 2022-03-02 LAB — GLUCOSE, CAPILLARY
Glucose-Capillary: 101 mg/dL — ABNORMAL HIGH (ref 70–99)
Glucose-Capillary: 116 mg/dL — ABNORMAL HIGH (ref 70–99)
Glucose-Capillary: 115 mg/dL — ABNORMAL HIGH (ref 70–99)
Glucose-Capillary: 129 mg/dL — ABNORMAL HIGH (ref 70–99)

## 2022-03-02 LAB — CBC
HCT: 25.1 % — ABNORMAL LOW (ref 39.0–52.0)
Hemoglobin: 8.9 g/dL — ABNORMAL LOW (ref 13.0–17.0)
MCH: 34.9 pg — ABNORMAL HIGH (ref 26.0–34.0)
MCHC: 35.5 g/dL (ref 30.0–36.0)
MCV: 98.4 fL (ref 80.0–100.0)
Platelets: 105 10*3/uL — ABNORMAL LOW (ref 150–400)
RBC: 2.55 MIL/uL — ABNORMAL LOW (ref 4.22–5.81)
RDW: 12.2 % (ref 11.5–15.5)
WBC: 11.4 10*3/uL — ABNORMAL HIGH (ref 4.0–10.5)
nRBC: 0 % (ref 0.0–0.2)

## 2022-03-02 LAB — BASIC METABOLIC PANEL
Anion gap: 14 (ref 5–15)
BUN: 17 mg/dL (ref 8–23)
CO2: 29 mmol/L (ref 22–32)
Calcium: 8.5 mg/dL — ABNORMAL LOW (ref 8.9–10.3)
Chloride: 92 mmol/L — ABNORMAL LOW (ref 98–111)
Creatinine, Ser: 1.11 mg/dL (ref 0.61–1.24)
GFR, Estimated: >60 mL/min (ref >60)
Glucose, Bld: 91 mg/dL (ref 70–99)
Potassium: 3.5 mmol/L (ref 3.5–5.1)
Sodium: 135 mmol/L (ref 135–145)

## 2022-03-02 MED ORDER — INSULIN ASPART 100 UNIT/ML IJ SOLN: 0.0000 [IU] | Freq: Three times a day (TID) | INTRAMUSCULAR | Status: DC

## 2022-03-02 NOTE — Progress Notes (Addendum)
[address]       [place] [address]             [phone]      3 Days Post-Op Procedure(s) (LRB): REDO CORONARY ARTERY BYPASS GRAFTING (CABG) X3 USING LEFT RADIAL ARTERY AND LEFT GREATER SAPHENOUS VEIN, HARVESTED ENDOSCOPICALLY (N/A) AORTIC VALVE REPLACEMENT (AVR) USING 21 INSPIRIS (N/A) RADIAL ARTERY HARVEST (Left) TRANSESOPHAGEAL ECHOCARDIOGRAM (TEE) (N/A) Subjective: Feels well, minimal pain  Objective: Vital signs in last 24 hours: Temp:  [98.9 F (37.2 C)-99.6 F (37.6 C)] 99.6 F (37.6 C) (07/16 0349) Pulse Rate:  [62-97] 67 (07/16 0349) Cardiac Rhythm: Other (Comment);Bundle branch block (07/15 1905) Resp:  [14-24] 17 (07/16 0349) BP: (108-150)/(53-88) 139/67 (07/16 0349) SpO2:  [89 %-100 %] 99 % (07/16 0349)  Hemodynamic parameters for last 24 hours:    Intake/Output from previous day: 07/15 0701 - 07/16 0700 In: 86 [I.V.:86] Out: 3490 [Urine:3490] Intake/Output this shift: No intake/output data recorded.  General appearance: alert, cooperative, and no distress Heart: regular rate and rhythm Lungs: min dim in bases Abdomen: benign Extremities: no edema Wound: incis healing well  Lab Results: Recent Labs    03/01/22 0343 03/02/22 0148  WBC 12.7* 11.4*  HGB 8.6* 8.9*  HCT 25.9* 25.1*  PLT 86* 105*   BMET:  Recent Labs    03/01/22 0343 03/02/22 0148  NA 135 135  K 3.8 3.5  CL 99 92*  CO2 28 29  GLUCOSE 106* 91  BUN 17 17  CREATININE 1.14 1.11  CALCIUM 8.0* 8.5*    PT/INR:  Recent Labs    02/27/22 1717  LABPROT 18.9*  INR 1.6*   ABG    Component Value Date/Time   PHART 7.301 (L) 02/28/2022 0014   HCO3 23.9 02/28/2022 0014   TCO2 25 02/28/2022 0014   ACIDBASEDEF 3.0 (H) 02/28/2022 0014   O2SAT 92 02/28/2022 0014   CBG (last 3)  Recent Labs    03/01/22 2029 03/01/22 2348 03/02/22 0353  GLUCAP 153* 99 101*    Meds Scheduled Meds:  acetaminophen  1,000 mg Oral Q6H   Or   acetaminophen (TYLENOL)  oral liquid 160 mg/5 mL  1,000 mg Per Tube Q6H   amLODipine  2.5 mg Oral Daily   aspirin EC  325 mg Oral Daily   Or   aspirin  324 mg Per Tube Daily   bisacodyl  10 mg Oral Daily   Or   bisacodyl  10 mg Rectal Daily   Chlorhexidine Gluconate Cloth  6 each Topical Daily   docusate sodium  200 mg Oral Daily   enoxaparin (LOVENOX) injection  40 mg Subcutaneous QHS   escitalopram  10 mg Oral QHS   furosemide  40 mg Oral Daily   insulin aspart  0-24 Units Subcutaneous TID WC   insulin detemir  25 Units Subcutaneous Daily   metoprolol tartrate  12.5 mg Oral BID   Or   metoprolol tartrate  12.5 mg Per Tube BID   pantoprazole  40 mg Oral Daily   potassium chloride  40 mEq Oral Daily   sodium chloride flush  10-40 mL Intracatheter Q12H   sodium chloride flush  3 mL Intravenous Q12H   sodium chloride flush  3 mL Intravenous Q12H   Continuous Infusions:  sodium chloride Stopped (02/28/22 1151)   sodium chloride     sodium chloride 10 mL/hr at 03/01/22 0600   sodium chloride     lactated ringers  lactated ringers 20 mL/hr at 03/01/22 1100   PRN Meds:.sodium chloride, sodium chloride, metoprolol tartrate, morphine injection, ondansetron (ZOFRAN) IV, mouth rinse, oxyCODONE, sodium chloride flush, sodium chloride flush, sodium chloride flush, traMADol  Xrays DG Chest Port 1 View  Result Date: 03/01/2022 CLINICAL DATA:  Recent CABG re-evaluate lung status. EXAM: PORTABLE CHEST 1 VIEW COMPARISON:  Portable chest yesterday at 5:23 a.m. FINDINGS: 5:24 a.m. Interval removal Swan-Ganz catheter with right IJ introducer sheath left in place with tip at the brachiocephalic/SVC junction. Two mediastinal drains remain in place.  No pneumothorax is seen. Small pneumomediastinum is less well seen today but still present. CABG changes are again noted, scattered atelectatic bands in the bases. No consolidation is seen or significant pleural effusion. IMPRESSION: No interval change in the overall aeration  and postsurgical findings. No focal pneumonia is evident. Bibasilar atelectatic bands persist. Electronically Signed   By: Almira Bar M.D.   On: 03/01/2022 07:14    Assessment/Plan: S/P Procedure(s) (LRB): REDO CORONARY ARTERY BYPASS GRAFTING (CABG) X3 USING LEFT RADIAL ARTERY AND LEFT GREATER SAPHENOUS VEIN, HARVESTED ENDOSCOPICALLY (N/A) AORTIC VALVE REPLACEMENT (AVR) USING 21 INSPIRIS (N/A) RADIAL ARTERY HARVEST (Left) TRANSESOPHAGEAL ECHOCARDIOGRAM (TEE) (N/A)  POD#3  1 afeb s BP 100's-150's, BBB 2 sats ok on 5 liters, wean as able 3 excellent diuresis, not weighed yet - received lasix and metolazone yesterday- continue lasix for now 4 normal renal fxn 5 leukocytosis trending lower, almost normal- likely reactive 6 expected ABLA , stable 7 thrombocytopenia trend improved 8 BS control ok- no DM meds preop, HG A1C 6.2 preop 9 CXR minor basilar atx 10 d/c epw's in am if no dysrhythmias 11 routine pulm hygiene and cardiac rehab modalities 12 poss home 1-2 days   LOS: 3 days    Rowe Clack PA-C Pager [phone] 03/02/2022    Agree with above Doing well Wean O2 IS, ambulation  Shardai Star O [employer]

## 2022-03-02 NOTE — Progress Notes (Signed)
Patient ambulated in hallway 470 feet with oxygen and rolling walker. Patient with a few standing rest breaks. Back in room call bell within reach.Samuel Cook, [employer]

## 2022-03-03 ENCOUNTER — Other Ambulatory Visit (HOSPITAL_COMMUNITY): Payer: Self-pay

## 2022-03-03 LAB — TYPE AND SCREEN
ABO/RH(D): A POS
Antibody Screen: NEGATIVE
Unit division: 0
Unit division: 0
Unit division: 0
Unit division: 0
Unit division: 0
Unit division: 0

## 2022-03-03 LAB — BPAM RBC
Blood Product Expiration Date: 202307282359
Blood Product Expiration Date: 202307282359
Blood Product Expiration Date: 202307282359
Blood Product Expiration Date: 202307282359
Blood Product Expiration Date: 202307282359
Blood Product Expiration Date: 202307292359
ISSUE DATE / TIME: 202307130706
ISSUE DATE / TIME: 202307130706
ISSUE DATE / TIME: 202307130706
ISSUE DATE / TIME: 202307150705
ISSUE DATE / TIME: 202307151230
Unit Type and Rh: 6200
Unit Type and Rh: 6200
Unit Type and Rh: 6200
Unit Type and Rh: 6200
Unit Type and Rh: 6200
Unit Type and Rh: 6200

## 2022-03-03 LAB — GLUCOSE, CAPILLARY
Glucose-Capillary: 96 mg/dL (ref 70–99)
Glucose-Capillary: 107 mg/dL — ABNORMAL HIGH (ref 70–99)
Glucose-Capillary: 94 mg/dL (ref 70–99)
Glucose-Capillary: 175 mg/dL — ABNORMAL HIGH (ref 70–99)

## 2022-03-03 MED ORDER — ASPIRIN 325 MG PO TBEC
325.0000 mg | DELAYED_RELEASE_TABLET | Freq: Every day | ORAL | Status: AC
Start: 2022-03-03 — End: ?

## 2022-03-03 MED ORDER — METOPROLOL TARTRATE 25 MG/10 ML ORAL SUSPENSION
25.0000 mg | Freq: Two times a day (BID) | ORAL | Status: DC
  Filled 2022-03-03 (×5): qty 10

## 2022-03-03 MED ORDER — AMLODIPINE BESYLATE 2.5 MG PO TABS
2.5000 mg | ORAL_TABLET | Freq: Every day | ORAL | 1 refills | Status: DC
  Filled 2022-03-03: qty 30, 30d supply, fill #0

## 2022-03-03 MED ORDER — METOPROLOL TARTRATE 25 MG PO TABS
25.0000 mg | ORAL_TABLET | Freq: Two times a day (BID) | ORAL | 1 refills | Status: DC
  Filled 2022-03-03: qty 60, 30d supply, fill #0

## 2022-03-03 MED ORDER — TRAMADOL HCL 50 MG PO TABS
50.0000 mg | ORAL_TABLET | Freq: Four times a day (QID) | ORAL | 0 refills | Status: AC | PRN
  Filled 2022-03-03: qty 30, 7d supply, fill #0

## 2022-03-03 MED ORDER — METOPROLOL TARTRATE 25 MG PO TABS
25.0000 mg | ORAL_TABLET | Freq: Two times a day (BID) | ORAL | Status: DC
  Administered 2022-03-03 – 2022-03-04 (×4): 25 mg via ORAL
  Filled 2022-03-03 (×4): qty 1

## 2022-03-03 NOTE — Progress Notes (Addendum)
      [address]       [place] [address]             [phone]        4 Days Post-Op Procedure(s) (LRB): REDO CORONARY ARTERY BYPASS GRAFTING (CABG) X3 USING LEFT RADIAL ARTERY AND LEFT GREATER SAPHENOUS VEIN, HARVESTED ENDOSCOPICALLY (N/A) AORTIC VALVE REPLACEMENT (AVR) USING 21 INSPIRIS (N/A) RADIAL ARTERY HARVEST (Left) TRANSESOPHAGEAL ECHOCARDIOGRAM (TEE) (N/A)  Subjective: Patient with cough (clearish, white). He would like to go home.  Objective: Vital signs in last 24 hours: Temp:  [98.3 F (36.8 C)-99.4 F (37.4 C)] 98.4 F (36.9 C) (07/17 0325) Pulse Rate:  [74-87] 83 (07/17 0325) Cardiac Rhythm: Normal sinus rhythm (07/16 2100) Resp:  [15-17] 15 (07/17 0325) BP: (92-145)/(53-98) 118/98 (07/17 0325) SpO2:  [93 %-97 %] 93 % (07/17 0325)  Pre op weight 85.3 kg Current Weight  03/01/22 95.1 kg      Intake/Output from previous day: 07/16 0701 - 07/17 0700 In: -  Out: 1275 [Urine:1275]   Physical Exam:  Cardiovascular: Slightly tachycardic, no murmur Pulmonary: Slightly diminished bibasilar breath sounds Abdomen: Soft, non tender, bowel sounds present. Extremities: Mild bilateral lower extremity edema. LUE motor/sensory intact Wounds: Clean. Slight sero sanguinous drainage mid sternum;no sign of infection.  LUE and LLE wounds are clean and dry  Lab Results: CBC: Recent Labs    03/01/22 0343 03/02/22 0148  WBC 12.7* 11.4*  HGB 8.6* 8.9*  HCT 25.9* 25.1*  PLT 86* 105*   BMET:  Recent Labs    03/01/22 0343 03/02/22 0148  NA 135 135  K 3.8 3.5  CL 99 92*  CO2 28 29  GLUCOSE 106* 91  BUN 17 17  CREATININE 1.14 1.11  CALCIUM 8.0* 8.5*    PT/INR:  Lab Results  Component Value Date   INR 1.6 (H) 02/27/2022   INR 1.0 02/25/2022   INR 1.35 12/26/2016   ABG:  INR: Will add last result for INR, ABG once components are confirmed Will add last 4 CBG results once components are confirmed  Assessment/Plan:  1. CV - ST  with HR in the low 100's this am. On Lopressor 12.5 mg bid and Amlodipine (radial artery harvest). Will increase Lopressor. 2.  Pulmonary - On room air.  Encourage incentive spirometer. 3. Volume Overload - On Lasix 40 mg daily 4.  Expected post op acute blood loss anemia - H and H yesterday stable at 8.9 and 25.1 5. Thrombocytopenia-platelets yesterday up to 105,000 6. CBGs 115/129/96. Pre op HGA1C 6.2. He likely has pre diabetes. Stop accu checks and SS PRN. He will need further surveillance by medical doctor after discharge 7. Remove EPW 8. Will discuss disposition with Dr. Rayburn Ma M ZimmermanPA-C 7:01 AM    Chart reviewed, patient examined, agree with above. His is doing well POD 4 He had a fair amount of serosanguinous drainage through bottom of incision last night when up to toilet. It is not draining now and incision looks ok. Continue dressing over incision until sure it is not draining. Sternum feels stable but he does not follow precautions very well.  DC pacing wires today and plan home tomorrow if everything remains stable.

## 2022-03-03 NOTE — Discharge Instructions (Signed)

## 2022-03-03 NOTE — Progress Notes (Signed)
CARDIAC REHAB PHASE I   PRE:  Rate/Rhythm: 77 SR  BP:  Sitting: 152/76      SaO2: 93 RA  MODE:  Ambulation: 470 ft   POST:  Rate/Rhythm: 106 ST  BP:  Sitting: 146/75    SaO2: 95 RA   Pt ambulated 427ft in hallway independently with steady gait. Pt states soreness in his leg and mild SOB, but denies CP or dizziness. Pt returned to bed. Pt declining DME needs at this time. Encouraged continued IS/Flutter use and ambulation. Will continue to follow.  [id number] Reynold Bowen, RN BSN 03/03/2022 12:57 PM

## 2022-03-03 NOTE — Progress Notes (Signed)
Discontinue EPW per MD request.   Wires intact.  R side insertion site: scant serous drainage.  L side dry  Vital signs per protocol.  Bed rest initiated x 1 hour.   Patient teaching completed. Patient tolerated well.

## 2022-03-03 NOTE — Care Management Important Message (Signed)
Important Message  Patient Details  Name: Samuel Cook MRN: [id number] Date of Birth: 14-Apr-1956   Medicare Important Message Given:  Yes     Renie Ora 03/03/2022, 10:27 AM

## 2022-03-04 LAB — BASIC METABOLIC PANEL
Anion gap: 18 — ABNORMAL HIGH (ref 5–15)
BUN: 18 mg/dL (ref 8–23)
CO2: 25 mmol/L (ref 22–32)
Calcium: 9.1 mg/dL (ref 8.9–10.3)
Chloride: 93 mmol/L — ABNORMAL LOW (ref 98–111)
Creatinine, Ser: 1.09 mg/dL (ref 0.61–1.24)
GFR, Estimated: >60 mL/min (ref >60)
Glucose, Bld: 153 mg/dL — ABNORMAL HIGH (ref 70–99)
Potassium: 3.4 mmol/L — ABNORMAL LOW (ref 3.5–5.1)
Sodium: 136 mmol/L (ref 135–145)

## 2022-03-04 LAB — GLUCOSE, CAPILLARY
Glucose-Capillary: 148 mg/dL — ABNORMAL HIGH (ref 70–99)
Glucose-Capillary: 155 mg/dL — ABNORMAL HIGH (ref 70–99)

## 2022-03-04 LAB — MAGNESIUM: Magnesium: 1.9 mg/dL (ref 1.7–2.4)

## 2022-03-04 MED ORDER — POTASSIUM CHLORIDE CRYS ER 20 MEQ PO TBCR
40.0000 meq | EXTENDED_RELEASE_TABLET | Freq: Once | ORAL | Status: AC
  Administered 2022-03-04: 40 meq via ORAL
  Filled 2022-03-04: qty 2

## 2022-03-04 MED ORDER — AMIODARONE HCL IN DEXTROSE 360-4.14 MG/200ML-% IV SOLN: 30.0000 mg/h | INTRAVENOUS | Status: DC

## 2022-03-04 MED ORDER — AMIODARONE LOAD VIA INFUSION
150.0000 mg | Freq: Once | INTRAVENOUS | Status: AC
  Administered 2022-03-04: 150 mg via INTRAVENOUS
  Filled 2022-03-04: qty 83.34

## 2022-03-04 MED ORDER — AMIODARONE HCL IN DEXTROSE 360-4.14 MG/200ML-% IV SOLN
60.0000 mg/h | INTRAVENOUS | Status: DC
  Administered 2022-03-04: 60 mg/h via INTRAVENOUS
  Filled 2022-03-04: qty 200

## 2022-03-04 MED ORDER — AMIODARONE HCL 200 MG PO TABS
400.0000 mg | ORAL_TABLET | Freq: Two times a day (BID) | ORAL | Status: DC
  Administered 2022-03-04 – 2022-03-05 (×2): 400 mg via ORAL
  Filled 2022-03-04 (×2): qty 2

## 2022-03-04 MED ORDER — MAGNESIUM SULFATE 2 GM/50ML IV SOLN
2.0000 g | Freq: Once | INTRAVENOUS | Status: AC
  Administered 2022-03-04: 2 g via INTRAVENOUS
  Filled 2022-03-04: qty 50

## 2022-03-04 MED ORDER — AMIODARONE HCL 200 MG PO TABS: 400.0000 mg | ORAL_TABLET | Freq: Two times a day (BID) | ORAL | Status: DC

## 2022-03-04 MED ORDER — AMIODARONE HCL IN DEXTROSE 360-4.14 MG/200ML-% IV SOLN: 60.0000 mg/h | INTRAVENOUS | Status: DC

## 2022-03-04 MED ORDER — POTASSIUM CHLORIDE CRYS ER 20 MEQ PO TBCR
30.0000 meq | EXTENDED_RELEASE_TABLET | ORAL | Status: AC
  Administered 2022-03-04 (×2): 30 meq via ORAL
  Filled 2022-03-04 (×2): qty 1

## 2022-03-04 MED FILL — Albumin, Human Inj 5%: INTRAVENOUS | Qty: 250 | Status: AC

## 2022-03-04 MED FILL — Lidocaine HCl Local Preservative Free (PF) Inj 2%: INTRAMUSCULAR | Qty: 15 | Status: AC

## 2022-03-04 MED FILL — Sodium Bicarbonate IV Soln 8.4%: INTRAVENOUS | Qty: 50 | Status: AC

## 2022-03-04 MED FILL — Electrolyte-R (PH 7.4) Solution: INTRAVENOUS | Qty: 7000 | Status: AC

## 2022-03-04 MED FILL — Heparin Sodium (Porcine) Inj 1000 Unit/ML: INTRAMUSCULAR | Qty: 20 | Status: AC

## 2022-03-04 MED FILL — Mannitol IV Soln 20%: INTRAVENOUS | Qty: 500 | Status: AC

## 2022-03-04 MED FILL — Sodium Chloride IV Soln 0.9%: INTRAVENOUS | Qty: 2000 | Status: AC

## 2022-03-04 NOTE — Progress Notes (Addendum)
      [address]       [place] [address]             [phone]     0919 Back in SR in the mid 70's. BP stable and he feels well. IV amiodarone infusing. Lab pending. Gaynelle Arabian, PA-C     0800 Called to see patient for tachyarrhythmia. EKG showing a-fib with RVR (170's).  Patient is awake and talking. No pain or shortness of breath.  SBP 140's.  PLAN: Cancel discharge Check BMP and Mg++  Load IV amiodarone and convert to PO as soon as possible since he only has a peripheral IV.   Tiburcio Bash [phone]   Chart reviewed, patient examined, agree with above. Ready for discharge this am with sinus rhythm in the 80's on Lopressor but then went into rapid AF 170's. Discharge cancelled and started on amiodarone.

## 2022-03-04 NOTE — Progress Notes (Signed)
Patient ID: Samuel Cook, male   DOB: 1956-02-05, 66 y.o.   MRN: [id number] TCTS  He went back to NSR 70's quickly after amio bolus and drip. Since he only has a peripheral IV I will switch to po at 6 pm and DC drip. If her maintains sinus overnight he may be able to go home tomorrow.

## 2022-03-04 NOTE — Progress Notes (Signed)
CARDIAC REHAB PHASE I   PRE:  Rate/Rhythm: 80 SR  BP:  Sitting: 140/64      SaO2: 99 RA  MODE:  Ambulation: 470 ft   POST:  Rate/Rhythm: 86  BP:  Sitting: 167/86      SaO2: 99 RA  Tolerated walk in hall well with no CP and slight SOB at times. Walk with standby assist using walker. Back to room to bed with call bell and bedside table in reach. Post op home education including IS use, risk factors, heart healthy diet, restrictions, incision care, exercise guidelines, sternal precautions and CRP2. All questions and concerns addressed. Order for CRP2 placed for AP. Will continure to follow.   1305-1400  Woodroe Chen, RN BSN 03/04/2022 1:53 PM

## 2022-03-04 NOTE — Progress Notes (Signed)
CARDIAC REHAB PHASE I     Attempted to walk and educate pt. He is currently having EKG changes evaluated and treated. Will return later today as time permits.   Woodroe Chen, RN BSN 03/04/2022 8:20 AM

## 2022-03-04 NOTE — Plan of Care (Signed)

## 2022-03-04 NOTE — Progress Notes (Addendum)
      [address]       [place] [address]             [phone]        5 Days Post-Op Procedure(s) (LRB): REDO CORONARY ARTERY BYPASS GRAFTING (CABG) X3 USING LEFT RADIAL ARTERY AND LEFT GREATER SAPHENOUS VEIN, HARVESTED ENDOSCOPICALLY (N/A) AORTIC VALVE REPLACEMENT (AVR) USING 21 INSPIRIS (N/A) RADIAL ARTERY HARVEST (Left) TRANSESOPHAGEAL ECHOCARDIOGRAM (TEE) (N/A)  Subjective: His only complaint is that he has had cough post op(clearish, white). He would like to go home.  Objective: Vital signs in last 24 hours: Temp:  [98.2 F (36.8 C)-99.7 F (37.6 C)] 98.2 F (36.8 C) (07/18 0503) Pulse Rate:  [76-102] 83 (07/18 0503) Cardiac Rhythm: Normal sinus rhythm (07/17 1900) Resp:  [15-23] 15 (07/18 0503) BP: (129-152)/(63-93) 136/68 (07/18 0503) SpO2:  [63 %-99 %] 94 % (07/18 0503) Weight:  [84.5 kg] 84.5 kg (07/18 0503)  Pre op weight 85.3 kg Current Weight  03/04/22 84.5 kg      Intake/Output from previous day: No intake/output data recorded.   Physical Exam:  Cardiovascular: RRR, no murmur Pulmonary: Slightly diminished bibasilar breath sounds Abdomen: Soft, non tender, bowel sounds present. Extremities: No lower extremity edema. LUE motor/sensory intact Wounds: Clean and drain. No further drainage from sternal wound. LUE and LLE wounds are clean and dry  Lab Results: CBC: Recent Labs    03/02/22 0148  WBC 11.4*  HGB 8.9*  HCT 25.1*  PLT 105*    BMET:  Recent Labs    03/02/22 0148  NA 135  K 3.5  CL 92*  CO2 29  GLUCOSE 91  BUN 17  CREATININE 1.11  CALCIUM 8.5*     PT/INR:  Lab Results  Component Value Date   INR 1.6 (H) 02/27/2022   INR 1.0 02/25/2022   INR 1.35 12/26/2016   ABG:  INR: Will add last result for INR, ABG once components are confirmed Will add last 4 CBG results once components are confirmed  Assessment/Plan:  1. CV - SR with HR in the 80's. On Lopressor 25 mg bid and Amlodipine (radial artery  harvest).  2.  Pulmonary - On room air.  Encourage incentive spirometer. 3. Volume Overload - On Lasix 40 mg daily. As discussed with Dr. Laneta Simmers, patient below pre op weight so will not need diuresis at discharge 4.  Expected post op acute blood loss anemia - Last H and H  stable at 8.9 and 25.1 5. Thrombocytopenia-Last platelets up to 105,000 6. Discharge;Chest tube sutures to remain and will be removed in the office after discharge  Samuel M ZimmermanPA-C 7:00 AM    Chart reviewed, patient examined, agree with above. Plan home today.

## 2022-03-04 NOTE — Significant Event (Signed)
Rapid Response Event Note   Reason for Call :  SVT/Afib RVR, rate up to 185 bpm  Initial Focused Assessment:  Pt lying in bed, AO. Conversing candidly with staff, no distress. He endorse feeling anxious as his heart increases. Heart rate is regular, tachycardic, BBB. When his rate slows, atrial fibrillation is noted. Pulses are 2+. Pt denies pain, lightheadedness and dizziness. Lung sounds are clear. Breathing regular, unlabored.  VS: T 98.75F, BP 121/75, HR 179, RR 16, SpO2 99% on room air  Interventions:  -EKG -Amio gtt and loading dose  Plan of Care:  -Discharge cancelled -Bedrest this morning -Mg and BMP per PA  Call rapid response for additional needs  Event Summary:  MD Notified: Joette Catching, PA Call Time: [id number] Arrival Time: 0800 End Time: 0277  Jennye Moccasin, RN

## 2022-03-04 NOTE — Progress Notes (Signed)
      [address]       [place] [address]             [phone]     Remains in stable SR.   K+ 3.4 and Mg++ 1.9. KCl and MgSO4 replacements ordered.  Will start oral amio this PM after electrolytes corrected and d/c the IV amio.   Gaynelle Arabian, PA-C

## 2022-03-05 ENCOUNTER — Other Ambulatory Visit (HOSPITAL_COMMUNITY): Payer: Self-pay

## 2022-03-05 LAB — BASIC METABOLIC PANEL
Anion gap: 11 (ref 5–15)
BUN: 20 mg/dL (ref 8–23)
CO2: 24 mmol/L (ref 22–32)
Calcium: 8.8 mg/dL — ABNORMAL LOW (ref 8.9–10.3)
Chloride: 103 mmol/L (ref 98–111)
Creatinine, Ser: 1.14 mg/dL (ref 0.61–1.24)
GFR, Estimated: >60 mL/min (ref >60)
Glucose, Bld: 120 mg/dL — ABNORMAL HIGH (ref 70–99)
Potassium: 5.5 mmol/L — ABNORMAL HIGH (ref 3.5–5.1)
Sodium: 138 mmol/L (ref 135–145)

## 2022-03-05 MED ORDER — METOPROLOL TARTRATE 50 MG PO TABS
50.0000 mg | ORAL_TABLET | Freq: Two times a day (BID) | ORAL | Status: DC
  Administered 2022-03-05: 50 mg via ORAL
  Filled 2022-03-05: qty 1

## 2022-03-05 MED ORDER — METOPROLOL TARTRATE 50 MG PO TABS
50.0000 mg | ORAL_TABLET | Freq: Two times a day (BID) | ORAL | 1 refills | Status: DC
  Filled 2022-03-05: qty 60, 30d supply, fill #0

## 2022-03-05 MED ORDER — AMIODARONE HCL 200 MG PO TABS
400.0000 mg | ORAL_TABLET | Freq: Two times a day (BID) | ORAL | 1 refills | Status: DC
  Filled 2022-03-05: qty 90, 23d supply, fill #0

## 2022-03-05 MED ORDER — METOPROLOL TARTRATE 25 MG/10 ML ORAL SUSPENSION
50.0000 mg | Freq: Two times a day (BID) | ORAL | Status: DC
  Filled 2022-03-05: qty 20

## 2022-03-05 NOTE — Progress Notes (Signed)
CARDIAC REHAB PHASE I   Offered to walk with pt. Pt states he's staying in bed until he get to go home as he does not want to risk his HR going up. Reinforced site care, restrictions, sternal precautions, and exercise guidelines. Will refer to CRP II .  [id number] Reynold Bowen, RN BSN 03/05/2022 8:14 AM

## 2022-03-05 NOTE — Discharge Summary (Signed)
[address]       [place] [address]             [phone]    Physician Discharge Summary  Patient ID: Samuel Cook MRN: [id number] DOB/AGE: 1955-10-03 66 y.o.  Admit date: 02/27/2022 Discharge date: 03/05/2022  Admission Diagnoses:  Patient Active Problem List   Diagnosis Date Noted   S/P AVR (aortic valve replacement) 02/27/2022   Severe aortic stenosis    Unstable angina (HCC)    Elevated hemoglobin (HCC) 09/03/2017   Hyperglycemia 02/16/2017   Anxiety disorder 02/04/2017   S/P CABG x 3 12/26/2016   Abnormal nuclear stress test 12/12/2016   Aortic stenosis 11/03/2016   Essential hypertension 10/20/2016   Hyperlipidemia 10/20/2016   Murmur 10/20/2016     Discharge Diagnoses:  Patient Active Problem List   Diagnosis Date Noted   S/P AVR (aortic valve replacement) 02/27/2022   Severe aortic stenosis    Unstable angina (HCC)    Elevated hemoglobin (HCC) 09/03/2017   Hyperglycemia 02/16/2017   Anxiety disorder 02/04/2017   S/P CABG x 3 12/26/2016   Abnormal nuclear stress test 12/12/2016   Aortic stenosis 11/03/2016   Essential hypertension 10/20/2016   Hyperlipidemia 10/20/2016   Murmur 10/20/2016     Discharged Condition: stable          HPI:   The patient is a 66 year old gentleman with a history of hypertension, hyperlipidemia, coronary artery disease status post CABG x3 by Dr. Servando Snare in 12/2016 (LIMA to LAD, SVG to OM, SVG to PDA), and aortic stenosis that has been followed by Dr. Johnsie Cancel.  An echocardiogram in January 2022 showed an increase in the mean gradient from 19 mm year before to 30 mmHg with a valve area of 1.1 cm.  There was mild aortic insufficiency.  His most recent echo on 09/26/2021 showed further increase in the mean gradient to 38.5 mmHg with peak gradient of 61.2 mmHg.  Aortic valve area was 0.94 cm.  There was mild aortic insufficiency.  Left ventricular ejection fraction remains 55 to 60%.  He subsequent  underwent a gated cardiac CTA which showed a severely calcified trileaflet aortic valve with restricted leaflet mobility.  The ascending aorta was measured at 3.4 cm with the sinotubular junction of 2.6 cm.  Cardiac catheterization was performed on 01/24/2022 and showed a patent left internal mammary graft to the LAD.  There is severe proximal LAD stenosis.  The mid and distal LAD and diagonal branch filled by the LIMA graft.  There was a severe calcified stenosis at the ostium of a large dominant left circumflex involving the ostium of a moderate-sized intermediate branch.  There is severe ostial stenosis of a moderate caliber obtuse marginal branch.  The vein grafts to the obtuse marginal branch and the PDA off the left circumflex were occluded.  Right heart pressures were normal.   The patient lives with his wife.  He denies any chest discomfort but has been having exertional shortness of breath and fatigue with occasional episodes of positional dizziness.  He denies any peripheral edema.  He has had no orthopnea or PND.    Hospital course:  The patient was admitted electively and on 02/27/2022 taken to the operating room at which time he underwent redo median sternotomy for CABG x3 and aortic valve replacement.  He tolerated the procedure well and was taken to the surgical intensive care unit in stable condition.  Postoperative hospital course: As dictated by [facility]  Gold PA-C: The patient has done well.  He has maintained stable hemodynamics.  He does have a chronic left bundle branch block and is maintaining sinus rhythm..  He was weaned from the ventilator without difficulty using standard cardiac postsurgical protocols.  He has been started on Norvasc for radial artery harvest.  Blood sugars have been under under good control using standard post cardiac surgical protocols.  He does have an expected postoperative volume overload and is responding well to diuretics.  All routine lines, monitors and  drainage devices have been discontinued in the standard fashion.  Oxygen has been weaned and he maintains good saturations on room air.  Incisions are healing well without evidence of infection.  His left hand is neurovascularly intact.  Does have expected acute blood loss anemia but this is stabilized and is not in the transfusion threshold.   Addendum: Patient was weaned to room air. He has been tolerating a diet and has had a bowel movement. Epicardial pacing wires were removed on 07/17. He did have minor sero sanguinous drainage from mid sternal incision. There is no sign of wound infection, however. The sternal drainage did stop. LUE and LLE wounds are clean, dry, and well healed. He is below pre op weight so will not require further diuresis. He was felt surgically stable for discharge ton 07/18;however, he went into atrial fibrillation with RVR so his discharge was held. He was put on an Amiodarone drip. He converted to sinus rhythm and was transitioned to oral Amiodarone. Lopressor was also increased to 50 mg bid.  Chest tube sutures will be removed in the office after discharge. Per Dr. Cyndia Bent, patient is stable for discharge today.  Consults: None  Significant Diagnostic Studies:  DG CHEST PORT 1 VIEW  Result Date: 03/02/2022 CLINICAL DATA:  Postop from CABG. EXAM: PORTABLE CHEST 1 VIEW COMPARISON:  03/01/2022 FINDINGS: Right jugular Cordis and mediastinal drains have been removed. No pneumothorax visualized. Mild bibasilar atelectasis shows improvement since previous study. No new or worsening areas of pulmonary opacity are seen. Heart size is stable. IMPRESSION: Mild improvement in bibasilar atelectasis. Electronically Signed   By: Marlaine Hind M.D.   On: 03/02/2022 09:12   DG Chest Port 1 View  Result Date: 03/01/2022 CLINICAL DATA:  Recent CABG re-evaluate lung status. EXAM: PORTABLE CHEST 1 VIEW COMPARISON:  Portable chest yesterday at 5:23 a.m. FINDINGS: 5:24 a.m. Interval removal  Swan-Ganz catheter with right IJ introducer sheath left in place with tip at the brachiocephalic/SVC junction. Two mediastinal drains remain in place.  No pneumothorax is seen. Small pneumomediastinum is less well seen today but still present. CABG changes are again noted, scattered atelectatic bands in the bases. No consolidation is seen or significant pleural effusion. IMPRESSION: No interval change in the overall aeration and postsurgical findings. No focal pneumonia is evident. Bibasilar atelectatic bands persist. Electronically Signed   By: Telford Nab M.D.   On: 03/01/2022 07:14   DG Chest Port 1 View  Result Date: 02/28/2022 CLINICAL DATA:  Post AVR, chest tubes, sore chest today EXAM: PORTABLE CHEST 1 VIEW COMPARISON:  Portable exam 0523 hours compared to 02/27/2022 FINDINGS: Interval removal of endotracheal and nasogastric tubes. RIGHT jugular Swan-Ganz catheter with tip projecting over proximal LEFT pulmonary artery. Mediastinal drains and epicardial pacing wires present. Enlargement of cardiac silhouette post AVR. Small amount of pneumomediastinum consistent with preceding surgery and chest tubes. Bibasilar atelectasis. No infiltrate, pleural effusion, or pneumothorax. IMPRESSION: Bibasilar atelectasis. Electronically Signed   By: Elta Guadeloupe  Thornton Papas M.D.   On: 02/28/2022 08:08   ECHO INTRAOPERATIVE TEE  Result Date: 02/27/2022  *INTRAOPERATIVE TRANSESOPHAGEAL REPORT *  Patient Name:   ENDRE FRESCH Date of Exam: 02/27/2022 Medical Rec #:  [id number]    Height:       70.0 in Accession #:    [id number]   Weight:       188.0 lb Date of Birth:  Jun 22, 1956   BSA:          2.03 m Patient Age:    63 years     BP:           139/57 mmHg Patient Gender: M            HR:           48 bpm. Exam Location:  Inpatient Transesophogeal exam was perform intraoperatively during surgical procedure. Patient was closely monitored under general anesthesia during the entirety of examination. Indications:     I25.700  Atherosclerosis of coronary artery bypass graft(s),                  unspecified, with unstable angina pectoris; I35.0 Nonrheumatic                  aortic (valve) stenosis Performing Phys: [id number] BARTLE Diagnosing Phys: Nolon Nations MD Complications: No known complications during this procedure. POST-OP IMPRESSIONS _ Left Ventricle: The left ventricle is unchanged from pre-bypass. _ Right Ventricle: The right ventricle appears unchanged from pre-bypass. _ Aorta: The aorta appears unchanged from pre-bypass. _ Left Atrial Appendage: The left atrial appendage appears unchanged from pre-bypass. _ Aortic Valve: Mild stenosis present. A bioprosthetic valve was placed, leaflets are freely mobile and leaflets thin. There is no regurgitation. Normal washing jets for valve type. No perivalvular leak noted.Mean gradient 59mmHg. _ Mitral Valve: The mitral valve appears unchanged from pre-bypass. _ Tricuspid Valve: The tricuspid valve appears unchanged from pre-bypass. _ Pulmonic Valve: The pulmonic valve appears unchanged from pre-bypass. _ Interatrial Septum: The interatrial septum appears unchanged from pre-bypass. PRE-OP FINDINGS  Left Ventricle: The left ventricle has normal systolic function, with an ejection fraction of 55-60%. The cavity size was normal. There is no left ventricular hypertrophy. Right Ventricle: The right ventricle has normal systolic function. The cavity was normal. There is no increase in right ventricular wall thickness. Left Atrium: Left atrial size was normal in size. No left atrial/left atrial appendage thrombus was detected. The left atrial appendage is well visualized and there is no evidence of thrombus present. Right Atrium: Right atrial size was normal in size. Interatrial Septum: No atrial level shunt detected by color flow Doppler. There is no evidence of a patent foramen ovale. Pericardium: There is no evidence of pericardial effusion. There is pleural effusion in the right lateral  region. Mitral Valve: The mitral valve is normal in structure. Mitral valve regurgitation is mild by color flow Doppler. There is no evidence of mitral valve vegetation. Tricuspid Valve: The tricuspid valve was normal in structure. Tricuspid valve regurgitation is trivial by color flow Doppler. There is no evidence of tricuspid valve vegetation. Aortic Valve: The aortic valve is tricuspid Aortic valve regurgitation is moderate by color flow Doppler. The jet is centrally-directed. There is moderate stenosis of the aortic valve. There is severe thickening and severe calcifcation present on the aortic valve non-coronary cusp with severely decreased mobility and there is moderate thickening and moderate calcification present on the aortic valve left coronary cusp with moderately decreased mobility  and there is moderate thickening and moderate calcification present on the aortic valve right coronary cusp with moderately decreased mobility. Pulmonic Valve: The pulmonic valve was normal in structure. Pulmonic valve regurgitation is trivial by color flow Doppler. Aorta: There is evidence of plaque in the descending aorta; Grade II, measuring 2-41mm in size. Shunts: There is no evidence of an atrial septal defect. +-------------+---------++ AORTIC VALVE           +-------------+---------++ AV Mean Grad:25.0 mmHg +-------------+---------++  Nolon Nations MD Electronically signed by Nolon Nations MD Signature Date/Time: 02/27/2022/6:23:26 PM    Final    DG Chest Port 1 View  Result Date: 02/27/2022 CLINICAL DATA:  Post AVR EXAM: PORTABLE CHEST 1 VIEW COMPARISON:  Portable exam 1705 hours compared to 02/25/2022 FINDINGS: Tip of endotracheal tube approximately 2.3 cm above carina. Nasogastric tube extends into abdomen. RIGHT jugular Swan-Ganz catheter tip projects over pulmonary outflow tract. Epicardial pacing leads noted. Enlargement of cardiac silhouette. Mediastinal contours and pulmonary vascularity normal.  Bibasilar atelectasis. No infiltrate, pleural effusion, or pneumothorax. IMPRESSION: Postoperative changes with bibasilar atelectasis. Electronically Signed   By: Lavonia Dana M.D.   On: 02/27/2022 17:19   VAS [place] LOWER EXT SAPHENOUS VEIN MAPPING  Result Date: 02/25/2022 LOWER EXTREMITY VEIN MAPPING Patient Name:  TESHON CHASSE  Date of Exam:   02/25/2022 Medical Rec #: [id number]     Accession #:    [id number] Date of Birth: 06-19-1956    Patient Gender: M Patient Age:   67 years Exam Location:  [facility] Procedure:      VAS [place] LOWER EXTREMITY SAPHENOUS VEIN MAPPING Referring Phys: Gilford Raid --------------------------------------------------------------------------------  Other Indications:  Precabg workup. Risk Factors:       Hypertension, hyperlipidemia, coronary artery disease. Other Risk Factors: History of CABG x3 in 2018. Right GSV harvested.  Performing Technologist: Oda Cogan RDMS, RVT  Examination Guidelines: A complete evaluation includes B-mode imaging, spectral Doppler, color Doppler, and power Doppler as needed of all accessible portions of each vessel. Bilateral testing is considered an integral part of a complete examination. Limited examinations for reoccurring indications may be performed as noted. +---------------+-----------+----------------------+---------------+-----------+   RT Diameter  RT Findings         GSV            LT Diameter  LT Findings      (cm)                                            (cm)                  +---------------+-----------+----------------------+---------------+-----------+      0.33                     Saphenofemoral         0.42                                                   Junction                                  +---------------+-----------+----------------------+---------------+-----------+      0.10  Proximal thigh         0.40                   +---------------+-----------+----------------------+---------------+-----------+                 Harvested       Mid thigh            0.31                  +---------------+-----------+----------------------+---------------+-----------+                 harvested      Distal thigh          0.32                  +---------------+-----------+----------------------+---------------+-----------+                 harvested          Knee              0.32                  +---------------+-----------+----------------------+---------------+-----------+      0.14                       Prox calf            0.28       branching  +---------------+-----------+----------------------+---------------+-----------+      0.10                        Mid calf            0.25                  +---------------+-----------+----------------------+---------------+-----------+      0.08                      Distal calf           0.26                  +---------------+-----------+----------------------+---------------+-----------+                                   Ankle              0.25                  +---------------+-----------+----------------------+---------------+-----------+ Right Tech Comments: Harvested in 2018 for CABG Diagnosing physician: Servando Snare MD Electronically signed by Servando Snare MD on 02/25/2022 at 2:56:39 PM.    Final    VAS US DOPPLER PRE CABG  Result Date: 02/25/2022 PREOPERATIVE VASCULAR EVALUATION Patient Name:  SAYYID TRNKA  Date of Exam:   02/25/2022 Medical Rec #: [id number]     Accession #:    [id number] Date of Birth: 06-05-1956    Patient Gender: M Patient Age:   5 years Exam Location:  [facility] Procedure:      VAS US DOPPLER PRE CABG Referring Phys: Gilford Raid --------------------------------------------------------------------------------  Risk Factors:  Hypertension, hyperlipidemia, past history of smoking, coronary                artery disease.  Other Factors: CABG x 3 in 2018. Performing Technologist: Oda Cogan RDMS, RVT  Examination Guidelines: A complete evaluation includes B-mode imaging, spectral Doppler, color Doppler, and power Doppler as  needed of all accessible portions of each vessel. Bilateral testing is considered an integral part of a complete examination. Limited examinations for reoccurring indications may be performed as noted.  Right Carotid Findings: +----------+--------+--------+--------+----------------------+--------+           PSV cm/sEDV cm/sStenosisDescribe              Comments +----------+--------+--------+--------+----------------------+--------+ CCA Prox  98                                                     +----------+--------+--------+--------+----------------------+--------+ CCA Distal104     11                                             +----------+--------+--------+--------+----------------------+--------+ ICA Prox  88      16      1-39%   calcific and irregular         +----------+--------+--------+--------+----------------------+--------+ ICA Distal78      18                                             +----------+--------+--------+--------+----------------------+--------+ ECA       110                                                    +----------+--------+--------+--------+----------------------+--------+ +----------+--------+-------+----------------+------------+           PSV cm/sEDV cmsDescribe        Arm Pressure +----------+--------+-------+----------------+------------+ [id number]            Multiphasic, WNL             +----------+--------+-------+----------------+------------+ +---------+--------+--+--------+---------+ VertebralPSV cm/s56EDV cm/sAntegrade +---------+--------+--+--------+---------+ Left Carotid Findings: +----------+--------+--------+--------+--------+--------+           PSV cm/sEDV cm/sStenosisDescribeComments  +----------+--------+--------+--------+--------+--------+ CCA Prox  74      12                               +----------+--------+--------+--------+--------+--------+ CCA Distal84                                       +----------+--------+--------+--------+--------+--------+ ICA Prox  68      13      1-39%   calcific         +----------+--------+--------+--------+--------+--------+ ICA Distal86      20                               +----------+--------+--------+--------+--------+--------+ ECA       98                                       +----------+--------+--------+--------+--------+--------+ +----------+--------+--------+----------------+------------+ SubclavianPSV cm/sEDV cm/sDescribe        Arm Pressure +----------+--------+--------+----------------+------------+  131             Multiphasic, WNL             +----------+--------+--------+----------------+------------+ +---------+--------+--+--------+--+---------+ VertebralPSV cm/s39EDV cm/s10Antegrade +---------+--------+--+--------+--+---------+  ABI Findings: +--------+------------------+-----+---------+--------+ Right   Rt Pressure (mmHg)IndexWaveform Comment  +--------+------------------+-----+---------+--------+ [id number]                    triphasic         +--------+------------------+-----+---------+--------+ PTA     180               1.26 triphasic         +--------+------------------+-----+---------+--------+ DP      179               1.25 triphasic         +--------+------------------+-----+---------+--------+ +--------+------------------+-----+---------+-------+ Left    Lt Pressure (mmHg)IndexWaveform Comment +--------+------------------+-----+---------+-------+ [id number]                    triphasic        +--------+------------------+-----+---------+-------+ PTA     174               1.22 triphasic         +--------+------------------+-----+---------+-------+ DP      161               1.13 triphasic        +--------+------------------+-----+---------+-------+ +-------+---------------+----------------+ ABI/TBIToday's ABI/TBIPrevious ABI/TBI +-------+---------------+----------------+ Right  1.2                             +-------+---------------+----------------+ Left   1.2                             +-------+---------------+----------------+  Right Doppler Findings: +-----------+--------+-----+---------+--------+ Site       PressureIndexDoppler  Comments +-----------+--------+-----+---------+--------+ Brachial   137          triphasic         +-----------+--------+-----+---------+--------+ Radial                  triphasic         +-----------+--------+-----+---------+--------+ Ulnar                   triphasic         +-----------+--------+-----+---------+--------+ Palmar Arch                      WNL      +-----------+--------+-----+---------+--------+  Left Doppler Findings: +--------+--------+-----+---------+--------+ Site    PressureIndexDoppler  Comments +--------+--------+-----+---------+--------+ [id number]          triphasic         +--------+--------+-----+---------+--------+ Radial               triphasic         +--------+--------+-----+---------+--------+ Ulnar                triphasic         +--------+--------+-----+---------+--------+  Summary: Right Carotid: Velocities in the right ICA are consistent with a 1-39% stenosis. Left Carotid: Velocities in the left ICA are consistent with a 1-39% stenosis. Vertebrals: Bilateral vertebral arteries demonstrate antegrade flow. Right ABI: Resting right ankle-brachial index is within normal range. No evidence of significant right lower extremity arterial disease. Left ABI: Resting left ankle-brachial index is within normal range. No evidence of significant left lower extremity arterial  disease. Right Upper Extremity: Doppler waveforms remain  within normal limits with right radial compression. Doppler waveforms remain within normal limits with right ulnar compression. Left Upper Extremity: Doppler waveforms remain within normal limits with left radial compression. Doppler waveforms decrease 50% with left ulnar compression.  Electronically signed by Lemar Livings MD on 02/25/2022 at 2:55:44 PM.    Final    DG Chest 2 View  Result Date: 02/25/2022 CLINICAL DATA:  Premed for redo CABG and AVR. Severe aortic stenosis. EXAM: CHEST - 2 VIEW COMPARISON:  02/16/2017 FINDINGS: Heart size is normal. Median sternotomy and CABG. No focal consolidations or pleural effusions. No pulmonary edema. IMPRESSION: No active cardiopulmonary disease. Electronically Signed   By: Norva Pavlov M.D.   On: 02/25/2022 11:56     Treatments: surgery:    02/27/2022   Surgeon:  [name], MD   First Assistant: Gershon Crane,  [facility]:   An experienced assistant was required given the complexity of this surgery and the standard of surgical care. The assistant was needed for radial artery and endoscopic vein harvest, exposure, dissection, suctioning, retraction of delicate tissues and sutures, instrument exchange and for overall help during this procedure.     Preoperative Diagnosis:  Severe multi-vessel coronary artery disease, severe aortic stenosis and moderate aortic insufficiency     Postoperative Diagnosis:  Same     Procedure:   Redo Median Sternotomy Extracorporeal circulation 3.   Redo Coronary artery bypass grafting x 3   Left radial artery graft to the OM SVG to Ramus SVG to PDA (LCX)   4.   Endoscopic vein harvest from the left leg 5.   Harvest of left radial artery pedicle graft 6.   Aortic valve replacement using a 21 mm Edwards INSPIRIS RESILIA pericardial valve.  Discharge Exam: Blood pressure (!) 148/71, pulse 81, temperature 99.2 F (37.3 C), temperature source Oral, resp. rate  16, height 5\' 10"  (1.778 m), weight 83.6 kg, SpO2 98 %. Cardiovascular: RRR, no murmur Pulmonary: Slightly diminished bibasilar breath sounds Abdomen: Soft, non tender, bowel sounds present. Extremities: No lower extremity edema. LUE motor/sensory intact Wounds: Clean and drain. No further drainage from sternal wound. LUE and LLE wounds are clean and dry   Discharge Medications:  The patient has been discharged on:   1.Beta Blocker:  Yes [ x  ]                              No   [   ]                              If No, reason:  2.Ace Inhibitor/ARB: Yes [   ]                                     No  [ x   ]                                     If No, reason:Hope to start once BP allows after discharge  3.Statin:   Yes [  x ]                  No  [   ]  If No, reason:  4.Ecasa:  Yes  [ x  ]                  No   [   ]                  If No, reason:  Patient had ACS upon admission:No  Plavix/P2Y12 inhibitor: Yes [   ]                                      No  [  x ]     Discharge Instructions     Amb Referral to Cardiac Rehabilitation   Complete by: As directed    Diagnosis:  CABG Valve Replacement     Valve: Aortic   CABG X ___: 3   After initial evaluation and assessments completed: Virtual Based Care may be provided alone or in conjunction with Phase 2 Cardiac Rehab based on patient barriers.: Yes      Allergies as of 03/05/2022   No Active Allergies      Medication List     STOP taking these medications    chlorthalidone 25 MG tablet Commonly known as: HYGROTON   lisinopril 20 MG tablet Commonly known as: ZESTRIL   nebivolol 10 MG tablet Commonly known as: BYSTOLIC       TAKE these medications    acetaminophen 500 MG tablet Commonly known as: TYLENOL Take 1,000 mg by mouth 2 (two) times daily.   amiodarone 200 MG tablet Commonly known as: PACERONE Take 2 tablets (400 mg total) by mouth 2 (two) times daily. For 5 days;then  take 200 mg bid for one week;then take 200 mg daily thereafter   amLODipine 2.5 MG tablet Commonly known as: NORVASC Take 1 tablet (2.5 mg total) by mouth at bedtime. What changed:  medication strength how much to take   aspirin EC 325 MG tablet Take 1 tablet (325 mg total) by mouth daily. What changed:  medication strength how much to take   escitalopram 10 MG tablet Commonly known as: LEXAPRO Take 10 mg by mouth at bedtime.   LORazepam 0.5 MG tablet Commonly known as: ATIVAN Take 1 tablet (0.5 mg total) by mouth 2 (two) times daily as needed. for anxiety What changed:  when to take this additional instructions   metoprolol tartrate 50 MG tablet Commonly known as: LOPRESSOR Take 1 tablet (50 mg total) by mouth 2 (two) times daily.   rosuvastatin 40 MG tablet Commonly known as: CRESTOR TAKE 1 TABLET BY MOUTH EVERY DAY   traMADol 50 MG tablet Commonly known as: ULTRAM Take 1 tablet (50 mg total) by mouth every 6 (six) hours as needed for moderate pain.        Follow-up Information     Gaye Pollack, MD Follow up.   Specialty: Cardiothoracic Surgery Why: See discharge paperwork for follow-up appointment with surgeon.  On the day you see Dr. Cyndia Bent obtain a chest x-ray from [facility] 1/2-hour prior to the appointment.  It is located in the same office complex. Contact information: [address]  [place] [phone]         Elgie Collard, PA-C Follow up.   Specialty: Cardiothoracic Surgery Why: See discharge paperwork for follow-up with cardiology Contact information: [place] [address] [phone]  [facility]. Go on 03/10/2022.   Specialty: Cardiothoracic Surgery Why: Appointment is with nurse only for suture removal. Appointment time is at 10:00 am Contact information: [address] [place], [place]  [place] [phone]                Signed:  Nani Skillern, PA-C 03/05/2022, 8:26 AM

## 2022-03-05 NOTE — Care Management Important Message (Signed)
Important Message  Patient Details  Name: Samuel Cook MRN: [id number] Date of Birth: 29-Mar-1956   Medicare Important Message Given:  Yes     Renie Ora 03/05/2022, 9:29 AM

## 2022-03-05 NOTE — Progress Notes (Signed)
6 Days Post-Op Procedure(s) (LRB): REDO CORONARY ARTERY BYPASS GRAFTING (CABG) X3 USING LEFT RADIAL ARTERY AND LEFT GREATER SAPHENOUS VEIN, HARVESTED ENDOSCOPICALLY (N/A) AORTIC VALVE REPLACEMENT (AVR) USING 21 INSPIRIS (N/A) RADIAL ARTERY HARVEST (Left) TRANSESOPHAGEAL ECHOCARDIOGRAM (TEE) (N/A) Subjective: No complaints. He has maintained sinus rhythm since yesterday afternoon.   Objective: Vital signs in last 24 hours: Temp:  [98.2 F (36.8 C)-99.2 F (37.3 C)] 99.2 F (37.3 C) (07/19 0431) Pulse Rate:  [44-145] 77 (07/19 0431) Cardiac Rhythm: Normal sinus rhythm (07/18 2000) Resp:  [14-21] 16 (07/19 0431) BP: (118-167)/(61-111) 148/71 (07/19 0431) SpO2:  [98 %-100 %] 98 % (07/19 0431) Weight:  [83.6 kg] 83.6 kg (07/19 0431)  Hemodynamic parameters for last 24 hours:    Intake/Output from previous day: 07/18 0701 - 07/19 0700 In: 240 [P.O.:240] Out: 550 [Urine:550] Intake/Output this shift: Total I/O In: 240 [P.O.:240] Out: -   General appearance: alert and cooperative Neurologic: intact Heart: regular rate and rhythm, S1, S2 normal, no murmur Lungs: clear to auscultation bilaterally Extremities: no edema Wound: incisions ok  Lab Results: No results for input(s): "WBC", "HGB", "HCT", "PLT" in the last 72 hours. BMET:  Recent Labs    03/04/22 0838 03/05/22 0240  NA 136 138  K 3.4* 5.5*  CL 93* 103  CO2 25 24  GLUCOSE 153* 120*  BUN 18 20  CREATININE 1.09 1.14  CALCIUM 9.1 8.8*    PT/INR: No results for input(s): "LABPROT", "INR" in the last 72 hours. ABG    Component Value Date/Time   PHART 7.301 (L) 02/28/2022 0014   HCO3 23.9 02/28/2022 0014   TCO2 25 02/28/2022 0014   ACIDBASEDEF 3.0 (H) 02/28/2022 0014   O2SAT 92 02/28/2022 0014   CBG (last 3)  Recent Labs    03/03/22 2101 03/04/22 0554 03/04/22 1113  GLUCAP 175* 148* 155*    Assessment/Plan: S/P Procedure(s) (LRB): REDO CORONARY ARTERY BYPASS GRAFTING (CABG) X3 USING LEFT RADIAL  ARTERY AND LEFT GREATER SAPHENOUS VEIN, HARVESTED ENDOSCOPICALLY (N/A) AORTIC VALVE REPLACEMENT (AVR) USING 21 INSPIRIS (N/A) RADIAL ARTERY HARVEST (Left) TRANSESOPHAGEAL ECHOCARDIOGRAM (TEE) (N/A)  POD 6 Hemodynamically stable in sinus rhythm with resting HR 90's. Will increase Lopressor to 50 bid since BP is on higher side.  Postop atrial fib converted on amio. Continue 400 bid for a week and then taper.  He is doing well otherwise and I think he can go home today.   LOS: 6 days    [name] 03/05/2022

## 2022-03-05 NOTE — Progress Notes (Signed)
03/05/2022 10:51 AM Discharge AVS meds taken today and those due this evening reviewed.  Follow-up appointments and when to call md reviewed.  D/C IV and TELE.  Questions and concerns addressed.   D/C home per orders.  Kathryne Hitch

## 2022-03-10 ENCOUNTER — Ambulatory Visit (INDEPENDENT_AMBULATORY_CARE_PROVIDER_SITE_OTHER): Payer: Self-pay | Admitting: *Deleted

## 2022-03-10 DIAGNOSIS — Z4802 Encounter for removal of sutures: Secondary | ICD-10-CM

## 2022-03-10 NOTE — Progress Notes (Signed)
Patient arrived for nurse visit to remove sutures post-Redo CABG/AVR 7/13 by Dr. Laneta Simmers.  Two sutures removed with no signs or symptoms of infection noted.  Patient tolerated suture removal well.  Patient and family instructed to keep the incision site clean and dry. Patient and family acknowledged instructions given.  Patient concerned about left ankle and foot swelling. Advised patient to elevate legs while at rest. Patient denies SOB. Advised patient to contact our office if swelling gets worse. Patient verbalized understanding. All questions answered.

## 2022-03-20 ENCOUNTER — Ambulatory Visit: Payer: Medicare HMO | Admitting: Student

## 2022-03-20 NOTE — Progress Notes (Addendum)
Office Visit    Patient Name: Samuel Cook Date of Encounter: 03/24/2022  PCP:  Carylon Perches, MD   [facility]  Cardiologist:  Charlton Haws, MD  Advanced Practice Provider:  No care team member to display Electrophysiologist:  None   HPI    Samuel Cook is a 66 y.o. male with a past medical history significant for hypertension, hyperlipidemia, and CAD status post CABG x3 by Dr. Tyrone Sage in 12/2016 (LIMA to LAD, SVG to OM, and SVG to PDA), and aortic stenosis presents today for hospital follow-up.  The patient was admitted electively and taken to the OR 02/27/2022 and underwent a redo median sternotomy for CABG x3 and aortic valve replacement.  He tolerated the procedure well.  He was started on Norvasc for radial artery harvest.  He did have some expected postoperative volume overload but responded well to diuretics.  Right before discharge on 7/18 he went into atrial fibrillation with RVR and his discharge was held.  He was treated with an amiodarone drip and converted to normal sinus rhythm.  He was transitioned to oral amiodarone and his Lopressor was increased to 50 mg twice daily.  He was then discharged on p.o. amiodarone.  Today, he is here for a hospital follow-up.  He has been feeling pretty good without any fluttering feeling in his chest.  He has been walking around his house because it has been too hot outside.  He feels good so he is hoping he can drive pretty soon.  He has his follow-up appointment with Dr. Lavinia Sharps next week and he plans to bring this up at that time.  We discussed taking plain old Mucinex for his congestion.  He states he does still have some shortness of breath from time to time but this could be due to exercise intolerance.  We reviewed his chest x-ray from right before discharge and he did have a small left oral effusion but nothing remarkable.  He continues to use his incentive spirometer.  His incisions are all healing well.  We did remove a  small suture in the center of his sternotomy incision.  Reports no shortness of breath nor dyspnea on exertion. Reports no chest pain, pressure, or tightness. No edema, orthopnea, PND. Reports no palpitations.    Past Medical History    Past Medical History:  Diagnosis Date   Anxiety    Aortic stenosis    Coronary artery disease    a. s/p CABG in 12/2016 with LIMA-LAD, reverse SVG-OM, and reverse SVG-PDA   GERD (gastroesophageal reflux disease)    occasionally   Heart murmur    Hyperlipidemia    Hypertension    Left bundle branch block (LBBB)    Past Surgical History:  Procedure Laterality Date   AORTIC VALVE REPLACEMENT N/A 02/27/2022   Procedure: AORTIC VALVE REPLACEMENT (AVR) USING 21 INSPIRIS;  Surgeon: [name], MD;  Location: [facility] OR;  Service: Open Heart Surgery;  Laterality: N/A;   CARDIAC CATHETERIZATION     CORONARY ARTERY BYPASS GRAFT N/A 12/26/2016   Procedure: CORONARY ARTERY BYPASS GRAFTING (CABG) x 3 WITH ENDOSCOPIC HARVESTING OF RIGHT SAPHENOUS VEIN;  Surgeon: Delight Ovens, MD;  [facility];  Service: Open Heart Surgery;  Laterality: N/A;   CORONARY ARTERY BYPASS GRAFT N/A 02/27/2022   Procedure: REDO CORONARY ARTERY BYPASS GRAFTING (CABG) X3 USING LEFT RADIAL ARTERY AND LEFT GREATER SAPHENOUS VEIN, HARVESTED ENDOSCOPICALLY;  Surgeon: [name], MD;  Location: [facility] OR;  Service: Open Heart Surgery;  Laterality: N/A;   LEFT HEART CATH AND CORONARY ANGIOGRAPHY N/A 12/12/2016   Procedure: Left Heart Cath and Coronary Angiography;  Surgeon: Belva Crome, MD;  Location: [facility];  Service: Cardiovascular;  Laterality: N/A;   none     none   RADIAL ARTERY HARVEST Left 02/27/2022   Procedure: RADIAL ARTERY HARVEST;  Surgeon: Gaye Pollack, MD;  Location: [facility];  Service: Open Heart Surgery;  Laterality: Left;   RIGHT/LEFT HEART CATH AND CORONARY/GRAFT ANGIOGRAPHY N/A 01/24/2022   Procedure: RIGHT/LEFT HEART CATH AND CORONARY/GRAFT ANGIOGRAPHY;   Surgeon: Burnell Blanks, MD;  Location: [facility];  Service: Cardiovascular;  Laterality: N/A;   TEE WITHOUT CARDIOVERSION N/A 12/26/2016   Procedure: TRANSESOPHAGEAL ECHOCARDIOGRAM (TEE);  Surgeon: Grace Isaac, MD;  Location: [facility];  Service: Open Heart Surgery;  Laterality: N/A;   TEE WITHOUT CARDIOVERSION N/A 02/27/2022   Procedure: TRANSESOPHAGEAL ECHOCARDIOGRAM (TEE);  Surgeon: Gaye Pollack, MD;  Location: [facility];  Service: Open Heart Surgery;  Laterality: N/A;   WISDOM TOOTH EXTRACTION      Allergies  No Active Allergies  EKGs/Labs/Other Studies Reviewed:   The following studies were reviewed today: Complications: No known complications during this procedure.  POST-OP IMPRESSIONS  _ Left Ventricle: The left ventricle is unchanged from pre-bypass.  _ Right Ventricle: The right ventricle appears unchanged from pre-bypass.  _ Aorta: The aorta appears unchanged from pre-bypass.  _ Left Atrial Appendage: The left atrial appendage appears unchanged from  pre-bypass.  _ Aortic Valve: Mild stenosis present. A bioprosthetic valve was placed,  leaflets are freely mobile and leaflets thin. There is no regurgitation.  Normal  washing jets for valve type. No perivalvular leak noted.Mean gradient  96mmHg.  _ Mitral Valve: The mitral valve appears unchanged from pre-bypass.  _ Tricuspid Valve: The tricuspid valve appears unchanged from pre-bypass.  _ Pulmonic Valve: The pulmonic valve appears unchanged from pre-bypass.  _ Interatrial Septum: The interatrial septum appears unchanged from  pre-bypass.   PRE-OP FINDINGS   Left Ventricle: The left ventricle has normal systolic function, with an  ejection fraction of 55-60%. The cavity size was normal. There is no left  ventricular hypertrophy.    Right Ventricle: The right ventricle has normal systolic function. The  cavity was normal. There is no increase in right ventricular wall  thickness.   Left Atrium:  Left atrial size was normal in size. No left atrial/left  atrial appendage thrombus was detected. The left atrial appendage is well  visualized and there is no evidence of thrombus present.   Right Atrium: Right atrial size was normal in size.   Interatrial Septum: No atrial level shunt detected by color flow Doppler.  There is no evidence of a patent foramen ovale.   Pericardium: There is no evidence of pericardial effusion. There is  pleural effusion in the right lateral region.   Mitral Valve: The mitral valve is normal in structure. Mitral valve  regurgitation is mild by color flow Doppler. There is no evidence of  mitral valve vegetation.   Tricuspid Valve: The tricuspid valve was normal in structure. Tricuspid  valve regurgitation is trivial by color flow Doppler. There is no evidence  of tricuspid valve vegetation.   Aortic Valve: The aortic valve is tricuspid Aortic valve regurgitation is  moderate by color flow Doppler. The jet is centrally-directed. There is  moderate stenosis of the aortic valve.  There is severe thickening and severe  calcifcation present on the aortic  valve non-coronary cusp with severely decreased mobility and there is  moderate thickening and moderate calcification present on the aortic valve  left coronary cusp with moderately  decreased mobility and there is moderate thickening and moderate  calcification present on the aortic valve right coronary cusp with  moderately decreased mobility.   Pulmonic Valve: The pulmonic valve was normal in structure.  Pulmonic valve regurgitation is trivial by color flow Doppler.    Aorta: There is evidence of plaque in the descending aorta; Grade II,  measuring 2-22mm in size.   Shunts: There is no evidence of an atrial septal defect.   +-------------+---------++  AORTIC VALVE            +-------------+---------++  AV Mean Grad:25.0 mmHg  +-------------+---------++     Lewie Loron MD   Electronically signed by Lewie Loron MD  Signature Date/Time: 02/27/2022/6:23:26 PM        Final   EKG:  EKG is  ordered today.   Recent Labs: 02/25/2022: ALT 28 03/02/2022: Hemoglobin 8.9; Platelets 105 03/04/2022: Magnesium 1.9 03/05/2022: BUN 20; Creatinine, Ser 1.14; Potassium 5.5; Sodium 138  Recent Lipid Panel    Component Value Date/Time   CHOL 155 07/04/2019 0812   TRIG 116 07/04/2019 0812   HDL 45 07/04/2019 0812   CHOLHDL 3.4 07/04/2019 0812   VLDL 19 02/02/2017 0839   LDLCALC 89 07/04/2019 0812    Home Medications   Current Meds  Medication Sig   acetaminophen (TYLENOL) 500 MG tablet Take 1,000 mg by mouth 2 (two) times daily.   amiodarone (PACERONE) 200 MG tablet Take 2 tablets (400 mg total) by mouth 2 (two) times daily for 5 days; then take 200 mg twice daily for one week; then take 200 mg daily thereafter   amLODipine (NORVASC) 2.5 MG tablet Take 1 tablet (2.5 mg total) by mouth at bedtime.   aspirin EC 325 MG tablet Take 1 tablet (325 mg total) by mouth daily.   escitalopram (LEXAPRO) 10 MG tablet Take 10 mg by mouth at bedtime.   LORazepam (ATIVAN) 0.5 MG tablet Take 1 tablet (0.5 mg total) by mouth 2 (two) times daily as needed. for anxiety (Patient taking differently: Take 0.5 mg by mouth daily.)   metoprolol tartrate (LOPRESSOR) 50 MG tablet Take 1 tablet (50 mg total) by mouth 2 (two) times daily.   rosuvastatin (CRESTOR) 40 MG tablet TAKE 1 TABLET BY MOUTH EVERY DAY   traMADol (ULTRAM) 50 MG tablet Take 1 tablet (50 mg total) by mouth every 6 (six) hours as needed for moderate pain.     Review of Systems      All other systems reviewed and are otherwise negative except as noted above.  Physical Exam    VS:  BP 124/76   Pulse (!) 55   Ht 5\' 10"  (1.778 m)   Wt 187 lb (84.8 kg)   SpO2 98%   BMI 26.83 kg/m  , BMI Body mass index is 26.83 kg/m.  Wt Readings from Last 3 Encounters:  03/24/22 187 lb (84.8 kg)  03/05/22 184 lb 4.8 oz (83.6 kg)   02/19/22 180 lb (81.6 kg)     GEN: Well nourished, well developed, in no acute distress. HEENT: normal. Neck: Supple, no JVD, carotid bruits, or masses. Cardiac: RRR, 2/6 systolic murmurs, rubs, or gallops. No clubbing, cyanosis, edema.  Radials/PT 2+ and equal bilaterally.  Respiratory:  Respirations regular and unlabored, clear to auscultation bilaterally. GI: Soft, nontender,  nondistended. MS: No deformity or atrophy. Skin: Warm and dry, no rash. Neuro:  Strength and sensation are intact. Psych: Normal affect. Incision: Suture from the center of the sternotomy incision was removed today.  Incisions are clean, dry, intact without any signs of infection.  Left radial harvest with some minor edema as well as his left lower leg  Assessment & Plan    CAD status post redo CABG x3, severe AS status post redo AVR -Feels okay, has some shortness of breath with activity which has been about the same since the hospital, likely due to exercise intolerance from surgery but also had a very small left pleural effusion on last CXR before discharge. Follow-up CXR next week  -Continue current medications: Norvasc 2.5 mg (radial artery harvest) amiodarone 200 mg daily, aspirin 81 mg daily, Lopressor 50 mg twice daily, and Crestor 40 mg daily -Can participate in cardiac rehab if okay with TCTS -Follow-up echocardiogram already ordered  Postop Atrial fibrillation -He has not had any of the fluttering feeling that he was having in the hospital -continue Amio 200 mg daily for 4 weeks, then can likely discontinue  Hypertension -well controlled today in the clinic  Hyperlipidemia -Will need a follow-up lipid panel during his next appointment  Disposition: Follow up 3 months with Jenkins Rouge, MD or APP.  Signed, Elgie Collard, PA-C 03/24/2022, 11:51 AM [place]

## 2022-03-24 ENCOUNTER — Ambulatory Visit: Payer: Medicare HMO | Admitting: Physician Assistant

## 2022-03-24 ENCOUNTER — Encounter: Payer: Self-pay | Admitting: Physician Assistant

## 2022-03-24 VITALS — BP 124/76 | HR 55 | Ht 70.0 in | Wt 187.0 lb

## 2022-03-24 DIAGNOSIS — Z951 Presence of aortocoronary bypass graft: Secondary | ICD-10-CM

## 2022-03-24 DIAGNOSIS — Z952 Presence of prosthetic heart valve: Secondary | ICD-10-CM | POA: Diagnosis not present

## 2022-03-24 DIAGNOSIS — I10 Essential (primary) hypertension: Secondary | ICD-10-CM | POA: Diagnosis not present

## 2022-03-24 DIAGNOSIS — I350 Nonrheumatic aortic (valve) stenosis: Secondary | ICD-10-CM | POA: Diagnosis not present

## 2022-03-24 DIAGNOSIS — E782 Mixed hyperlipidemia: Secondary | ICD-10-CM | POA: Diagnosis not present

## 2022-03-24 DIAGNOSIS — I4891 Unspecified atrial fibrillation: Secondary | ICD-10-CM

## 2022-03-24 NOTE — Patient Instructions (Addendum)
Medication Instructions:  Your physician recommends that you continue on your current medications as directed. Please refer to the Current Medication list given to you today.  1.Be sure that you are taking the amiodarone 200 mg daily *If you need a refill on your cardiac medications before your next appointment, please call your pharmacy*   Lab Work: None If you have labs (blood work) drawn today and your tests are completely normal, you will receive your results only by: MyChart Message (if you have MyChart) OR A paper copy in the mail If you have any lab test that is abnormal or we need to change your treatment, we will call you to review the results.   Follow-Up: At [facility], you and your health needs are our priority.  As part of our continuing mission to provide you with exceptional heart care, we have created designated Provider Care Teams.  These Care Teams include your primary Cardiologist (physician) and Advanced Practice Providers (APPs -  Physician Assistants and Nurse Practitioners) who all work together to provide you with the care you need, when you need it.   Your next appointment:   3 month(s)  The format for your next appointment:   In Person  Provider:   Jari Favre, PA-C     {  Important Information About Sugar

## 2022-04-08 ENCOUNTER — Other Ambulatory Visit: Payer: Self-pay | Admitting: Surgery

## 2022-04-08 DIAGNOSIS — Z952 Presence of prosthetic heart valve: Secondary | ICD-10-CM

## 2022-04-09 ENCOUNTER — Other Ambulatory Visit: Payer: Self-pay | Admitting: *Deleted

## 2022-04-09 ENCOUNTER — Ambulatory Visit: Payer: Medicare HMO | Admitting: Surgery

## 2022-04-09 ENCOUNTER — Encounter: Payer: Self-pay | Admitting: Physician Assistant

## 2022-04-09 ENCOUNTER — Ambulatory Visit (INDEPENDENT_AMBULATORY_CARE_PROVIDER_SITE_OTHER): Payer: Self-pay | Admitting: Physician Assistant

## 2022-04-09 ENCOUNTER — Ambulatory Visit
Admission: RE | Admit: 2022-04-09 | Discharge: 2022-04-09 | Disposition: A | Discharge status: Home / Self Care | Payer: Medicare HMO | Source: Ambulatory Visit | Attending: Surgery | Admitting: Surgery

## 2022-04-09 VITALS — BP 115/75 | HR 61 | Resp 20 | Ht 70.0 in | Wt 191.0 lb

## 2022-04-09 DIAGNOSIS — J9811 Atelectasis: Secondary | ICD-10-CM | POA: Diagnosis not present

## 2022-04-09 DIAGNOSIS — Z952 Presence of prosthetic heart valve: Secondary | ICD-10-CM | POA: Diagnosis not present

## 2022-04-09 DIAGNOSIS — I2510 Atherosclerotic heart disease of native coronary artery without angina pectoris: Secondary | ICD-10-CM

## 2022-04-09 DIAGNOSIS — Z951 Presence of aortocoronary bypass graft: Secondary | ICD-10-CM

## 2022-04-09 DIAGNOSIS — I350 Nonrheumatic aortic (valve) stenosis: Secondary | ICD-10-CM

## 2022-04-09 DIAGNOSIS — J90 Pleural effusion, not elsewhere classified: Secondary | ICD-10-CM | POA: Diagnosis not present

## 2022-04-09 NOTE — Patient Instructions (Signed)
-  No change in medications today.  We will likely discontinue the amiodarone and amlodipine at the next visit.  -Continue to observe sternal precautions with no lifting greater than 15 pounds for another 6 weeks.  -You may resume driving  -A referral was made for cardiac rehab.  -Follow-up in 1 month  -

## 2022-04-09 NOTE — Progress Notes (Signed)
[address]       [name] [address]             [phone]       HPI: Mr. Samuel Cook is a 66 year old male with a past history of hypertension, dyslipidemia, and coronary artery disease. Patient returns for routine postoperative follow-up having undergone redo median sternotomy with CABG x3 and aortic valve replacement by Dr. Laneta Simmers on 02/27/2022.  He had a previous CABG procedure by Dr. Tyrone Sage in 2018. The patient's early postoperative recovery while in the hospital was notable for atrial fibrillation treated with amiodarone with conversion back to sinus rhythm prior to discharge.  He was discharged on a tapering oral dose of amiodarone and metoprolol. Since hospital discharge the patient reports continued recovery with improving endurance and exercise tolerance.  He had some shortness of breath with activity early on but this has improved significantly in the last few weeks.  Samuel Cook also said he had significant swelling primarily in his left lower extremity after going home but this has improved.  He denies having any palpitations.  He is very pleased with the results of his surgery.  Current Outpatient Medications  Medication Sig Dispense Refill   acetaminophen (TYLENOL) 500 MG tablet Take 1,000 mg by mouth 2 (two) times daily.     amiodarone (PACERONE) 200 MG tablet Take 2 tablets (400 mg total) by mouth 2 (two) times daily for 5 days; then take 200 mg twice daily for one week; then take 200 mg daily thereafter 90 tablet 1   amLODipine (NORVASC) 2.5 MG tablet Take 1 tablet (2.5 mg total) by mouth at bedtime. 30 tablet 1   aspirin EC 325 MG tablet Take 1 tablet (325 mg total) by mouth daily.     escitalopram (LEXAPRO) 10 MG tablet Take 10 mg by mouth at bedtime.     LORazepam (ATIVAN) 0.5 MG tablet Take 1 tablet (0.5 mg total) by mouth 2 (two) times daily as needed. for anxiety (Patient taking differently: Take 0.5 mg by mouth daily.) 60 tablet 0   metoprolol  tartrate (LOPRESSOR) 50 MG tablet Take 1 tablet (50 mg total) by mouth 2 (two) times daily. 60 tablet 1   rosuvastatin (CRESTOR) 40 MG tablet TAKE 1 TABLET BY MOUTH EVERY DAY 90 tablet 0   traMADol (ULTRAM) 50 MG tablet Take 1 tablet (50 mg total) by mouth every 6 (six) hours as needed for moderate pain. 30 tablet 0   No current facility-administered medications for this visit.    Physical Exam  Vital signs: BP 115/75 Pulse 61 Respirations 20 SPO2 96% on room air  General: Mr. Samuel Cook appears to be doing well.  He was walking with a strong, steady gait. Heart: Regular rate and rhythm, soft systolic murmur consistent with the bioprosthetic aortic valve. Chest: The sternotomy incision and chest tube sites are well-healed.  The sternum is stable.  Breath sounds are clear to auscultation. Extremities: Mild bilateral lower extremity edema.  The left-sided EVH incision in the left radial harvest incision are healing with no sign of complication.   Diagnostic Tests: Narrative & Impression  CLINICAL DATA:  Aortic valve replacement   EXAM: CHEST - 2 VIEW   COMPARISON:  03/02/2022   FINDINGS: Aortic valve replacement in satisfactory position. Heart size normal. Vascularity normal.   Mild left lower lobe atelectasis. Small left pleural effusion. Negative for pneumonia.   IMPRESSION: Postop aortic valve replacement   Mild left lower lobe  atelectasis and small left effusion. Improved aeration in the bases since the prior study.     Electronically Signed   By: Marlan Palau M.D.   On: 04/09/2022 13:04    Impression / Plan:  -Recurrent coronary artery disease and aortic valve stenosis, status post redo sternotomy with three-vessel coronary bypass grafting and aortic valve replacement with a 21 mm Inspiris Resilia bovine pericardial tissue valve.  Samuel Cook is progressing well and is very pleased that his shortness of breath has resolved following the procedures.  We will plan to  continue the amlodipine for another month to optimize radial graft patency.    -Hypertension - well controlled on metoprolol 50 mg twice daily.  -Postoperative atrial fibrillation-no evidence of recurrence since discharge from the hospital.  He is currently on amiodarone 200 mg once daily.  Suggest he continue that for 1 month and we can likely discontinue it at the next visit.  -Disposition-Mr. Samuel Cook may resume driving.  He is encouraged to continue him for exercise.  A referral was made for cardiac rehab.  He should continue with his medications with no changes today but we will likely discontinue the amlodipine and amiodarone at the next visit.  Continue to observe sternal precautions as instructed for another 6 weeks.  Leary Roca, PA-C [facility] [phone]

## 2022-04-10 ENCOUNTER — Other Ambulatory Visit (HOSPITAL_COMMUNITY): Payer: Self-pay | Admitting: *Deleted

## 2022-04-10 ENCOUNTER — Ambulatory Visit (HOSPITAL_COMMUNITY)
Admission: RE | Admit: 2022-04-10 | Discharge: 2022-04-10 | Disposition: A | Discharge status: Home / Self Care | Payer: Medicare HMO | Source: Ambulatory Visit | Attending: Cardiovascular Disease | Admitting: Cardiovascular Disease

## 2022-04-10 DIAGNOSIS — Z952 Presence of prosthetic heart valve: Secondary | ICD-10-CM | POA: Diagnosis not present

## 2022-04-10 DIAGNOSIS — I350 Nonrheumatic aortic (valve) stenosis: Secondary | ICD-10-CM | POA: Insufficient documentation

## 2022-04-10 LAB — ECHOCARDIOGRAM COMPLETE
AR max vel: 1.52 cm2
AV Area VTI: 1.47 cm2
AV Area mean vel: 1.51 cm2
AV Mean grad: 7.5 mmHg
AV Peak grad: 13.8 mmHg
Ao pk vel: 1.86 m/s
Area-P 1/2: 3.21 cm2
Calc EF: 51.1 %
S' Lateral: 3.6 cm
Single Plane A2C EF: 46.5 %
Single Plane A4C EF: 55 %

## 2022-04-10 NOTE — Progress Notes (Signed)
*  PRELIMINARY RESULTS* Echocardiogram 2D Echocardiogram has been performed.  Samuel Cook 04/10/2022, 10:28 AM

## 2022-04-14 DIAGNOSIS — H5200 Hypermetropia, unspecified eye: Secondary | ICD-10-CM | POA: Diagnosis not present

## 2022-04-14 DIAGNOSIS — Z0100 Encounter for examination of eyes and vision without abnormal findings: Secondary | ICD-10-CM | POA: Diagnosis not present

## 2022-04-30 ENCOUNTER — Encounter: Payer: Self-pay | Admitting: Surgery

## 2022-04-30 ENCOUNTER — Ambulatory Visit (INDEPENDENT_AMBULATORY_CARE_PROVIDER_SITE_OTHER): Payer: Self-pay | Admitting: Surgery

## 2022-04-30 VITALS — BP 145/68 | HR 59 | Resp 18 | Ht 70.0 in | Wt 191.0 lb

## 2022-04-30 DIAGNOSIS — Z951 Presence of aortocoronary bypass graft: Secondary | ICD-10-CM

## 2022-04-30 NOTE — Progress Notes (Signed)
HPI:  The patient returns for follow-up status post redo CABG x3 and AVR using a 21 mm Inspiris pericardial valve on 02/27/2022.  He saw Joette Catching in the office on 04/09/2022 and was doing well.  He continues to feel well and is walking daily without chest pain or shortness of breath.  He has returned to driving.  Current Outpatient Medications  Medication Sig Dispense Refill   acetaminophen (TYLENOL) 500 MG tablet Take 1,000 mg by mouth 2 (two) times daily.     amiodarone (PACERONE) 200 MG tablet Take 2 tablets (400 mg total) by mouth 2 (two) times daily for 5 days; then take 200 mg twice daily for one week; then take 200 mg daily thereafter 90 tablet 1   amLODipine (NORVASC) 2.5 MG tablet Take 1 tablet (2.5 mg total) by mouth at bedtime. 30 tablet 1   aspirin EC 325 MG tablet Take 1 tablet (325 mg total) by mouth daily.     escitalopram (LEXAPRO) 10 MG tablet Take 10 mg by mouth at bedtime.     LORazepam (ATIVAN) 0.5 MG tablet Take 1 tablet (0.5 mg total) by mouth 2 (two) times daily as needed. for anxiety (Patient taking differently: Take 0.5 mg by mouth daily.) 60 tablet 0   metoprolol tartrate (LOPRESSOR) 50 MG tablet Take 1 tablet (50 mg total) by mouth 2 (two) times daily. 60 tablet 1   rosuvastatin (CRESTOR) 40 MG tablet TAKE 1 TABLET BY MOUTH EVERY DAY 90 tablet 0   traMADol (ULTRAM) 50 MG tablet Take 1 tablet (50 mg total) by mouth every 6 (six) hours as needed for moderate pain. 30 tablet 0   No current facility-administered medications for this visit.     Physical Exam: BP (!) 145/68 (BP Location: Right Arm, Patient Position: Sitting)   Pulse (!) 59   Resp 18   Ht 5\' 10"  (1.778 m)   Wt 191 lb (86.6 kg)   SpO2 98% Comment: Ra  BMI 27.41 kg/m  He looks well. Cardiac exam shows a regular rate and rhythm with normal heart sounds.  There is no murmur. Lungs are clear. The chest incision is well-healed and the sternum is stable. His left arm incision and leg incisions are  healing well.  There is no significant peripheral edema.  Diagnostic Tests:  ECHOCARDIOGRAM REPORT         Patient Name:   LEYLAND KENNA Date of Exam: 04/10/2022  Medical Rec #:  04/12/2022    Height:       70.0 in  Accession #:    [id number]   Weight:       191.0 lb  Date of Birth:  [birth year]   BSA:          2.047 m  Patient Age:    65 years     BP:           151/84 mmHg  Patient Gender: M            HR:           57 bpm.  Exam Location:  06/18/1956   Procedure: 2D Echo, Cardiac Doppler and Color Doppler   Indications:    Z95.2 (ICD-10-CM) - S/P AVR (aortic valve replacement)     History:        Patient has prior history of Echocardiogram examinations,  most                  recent 02/27/2022. CAD, Prior  CABG, Arrythmias:LBBB and  Atrial                  Fibrillation; Risk Factors:Hypertension and Dyslipidemia.     Sonographer:    Alvino Chapel RCS  Referring Phys: [place]     1. Left ventricular ejection fraction, by estimation, is 55%. The left  ventricle has low normal function. The left ventricle has no regional wall  motion abnormalities. Left ventricular diastolic parameters are consistent  with Grade II diastolic  dysfunction (pseudonormalization). Elevated left atrial pressure.   2. Right ventricular systolic function is normal. The right ventricular  size is normal. There is normal pulmonary artery systolic pressure.   3. Left atrial size was mildly dilated.   4. The mitral valve is normal in structure. No evidence of mitral valve  regurgitation. No evidence of mitral stenosis.   5. The tricuspid valve is abnormal.   6. 21 mm Edwards INSPIRIS RESILIA pericardial valve is in the AV position  with normal function. The aortic valve has been repaired/replaced. Aortic  valve regurgitation is not visualized. No aortic stenosis is present.   7. The inferior vena cava is dilated in size with >50% respiratory  variability, suggesting right  atrial pressure of 8 mmHg.   FINDINGS   Left Ventricle: Left ventricular ejection fraction, by estimation, is  55%. The left ventricle has low normal function. The left ventricle has no  regional wall motion abnormalities. The left ventricular internal cavity  size was normal in size. There is  no left ventricular hypertrophy. Left ventricular diastolic parameters are  consistent with Grade II diastolic dysfunction (pseudonormalization).  Elevated left atrial pressure.   Right Ventricle: The right ventricular size is normal. Right vetricular  wall thickness was not well visualized. Right ventricular systolic  function is normal. There is normal pulmonary artery systolic pressure.  The tricuspid regurgitant velocity is 2.49  m/s, and with an assumed right atrial pressure of 8 mmHg, the estimated  right ventricular systolic pressure is 0000000 mmHg.   Left Atrium: Left atrial size was mildly dilated.   Right Atrium: Right atrial size was normal in size.   Pericardium: There is no evidence of pericardial effusion.   Mitral Valve: The mitral valve is normal in structure. No evidence of  mitral valve regurgitation. No evidence of mitral valve stenosis.   Tricuspid Valve: The tricuspid valve is abnormal. Tricuspid valve  regurgitation is mild . No evidence of tricuspid stenosis.   Aortic Valve: 21 mm Edwards INSPIRIS RESILIA pericardial valve is in the  AV position with normal function. The aortic valve has been  repaired/replaced. Aortic valve regurgitation is not visualized. No aortic  stenosis is present. Aortic valve mean  gradient measures 7.5 mmHg. Aortic valve peak gradient measures 13.8 mmHg.  Aortic valve area, by VTI measures 1.47 cm.   Pulmonic Valve: The pulmonic valve was not well visualized. Pulmonic valve  regurgitation is not visualized. No evidence of pulmonic stenosis.   Aorta: The aortic root is normal in size and structure.   Venous: The inferior vena cava is  dilated in size with greater than 50%  respiratory variability, suggesting right atrial pressure of 8 mmHg.   IAS/Shunts: No atrial level shunt detected by color flow Doppler.      LEFT VENTRICLE  PLAX 2D  LVIDd:         4.50 cm     Diastology  LVIDs:  3.60 cm     LV e' medial:    6.53 cm/s  LV PW:         1.00 cm     LV E/e' medial:  17.9  LV IVS:        0.90 cm     LV e' lateral:   6.74 cm/s  LVOT diam:     1.90 cm     LV E/e' lateral: 17.4  LV SV:         65  LV SV Index:   32  LVOT Area:     2.84 cm     LV Volumes (MOD)  LV vol d, MOD A2C: 74.2 ml  LV vol d, MOD A4C: 96.2 ml  LV vol s, MOD A2C: 39.7 ml  LV vol s, MOD A4C: 43.3 ml  LV SV MOD A2C:     34.5 ml  LV SV MOD A4C:     96.2 ml  LV SV MOD BP:      44.1 ml   RIGHT VENTRICLE  RV S prime:     9.25 cm/s  TAPSE (M-mode): 1.7 cm   LEFT ATRIUM             Index        RIGHT ATRIUM           Index  LA diam:        4.40 cm 2.15 cm/m   RA Area:     22.00 cm  LA Vol (A2C):   63.5 ml 31.02 ml/m  RA Volume:   65.50 ml  32.00 ml/m  LA Vol (A4C):   81.3 ml 39.72 ml/m  LA Biplane Vol: 73.1 ml 35.71 ml/m   AORTIC VALVE  AV Area (Vmax):    1.52 cm  AV Area (Vmean):   1.51 cm  AV Area (VTI):     1.47 cm  AV Vmax:           186.00 cm/s  AV Vmean:          123.000 cm/s  AV VTI:            0.441 m  AV Peak Grad:      13.8 mmHg  AV Mean Grad:      7.5 mmHg  LVOT Vmax:         99.90 cm/s  LVOT Vmean:        65.300 cm/s  LVOT VTI:          0.228 m  LVOT/AV VTI ratio: 0.52     AORTA  Ao Root diam: 2.90 cm   MITRAL VALVE                TRICUSPID VALVE  MV Area (PHT): 3.21 cm     TR Peak grad:   24.8 mmHg  MV Decel Time: 236 msec     TR Vmax:        249.00 cm/s  MV E velocity: 117.00 cm/s  MV A velocity: 88.30 cm/s   SHUNTS  MV E/A ratio:  1.33         Systemic VTI:  0.23 m                              Systemic Diam: 1.90 cm   Carlyle Dolly MD  Electronically signed by Carlyle Dolly MD  Signature  Date/Time: 04/10/2022/10:59:12 AM  Final    Impression:  He is doing well 2 months following his surgery.  A follow-up echo on 04/10/2022 shows normal left ventricular systolic function and normal prosthetic aortic valve function with a mean gradient of 7.5 mmHg.  I encouraged him to continue remaining active.  I told him to discontinue the amiodarone for postoperative atrial fibrillation.  I also told him to discontinue the amlodipine which was started for his radial artery graft.  He is going to increase his aspirin to 325 mg daily.  Plan:  He will follow-up with Dr. Ouida Sills and Dr. Eden Emms and will return to see me if he has any problems with his incisions.   [name], MD [facility] [phone]

## 2022-05-01 ENCOUNTER — Other Ambulatory Visit (HOSPITAL_COMMUNITY): Payer: Self-pay

## 2022-05-07 ENCOUNTER — Encounter: Payer: Medicare HMO | Admitting: Surgery

## 2022-06-24 ENCOUNTER — Ambulatory Visit: Payer: Medicare HMO | Admitting: Physician Assistant

## 2022-07-20 NOTE — Progress Notes (Unsigned)
Office Visit    Patient Name: Samuel Cook Date of Encounter: 07/21/2022  PCP:  Asencion Noble, [facility]  Cardiologist:  Jenkins Rouge, MD  Advanced Practice Provider:  No care team member to display Electrophysiologist:  None   HPI    Samuel Cook is a 66 y.o. male with a past medical history significant for hypertension, hyperlipidemia, and CAD status post CABG x3 by Dr. Servando Snare in 12/2016 (LIMA to LAD, SVG to OM, and SVG to PDA), and aortic stenosis presents today for hospital follow-up.  The patient was admitted electively and taken to the OR 02/27/2022 and underwent a redo median sternotomy for CABG x3 and aortic valve replacement.  He tolerated the procedure well.  He was started on Norvasc for radial artery harvest.  He did have some expected postoperative volume overload but responded well to diuretics.  Right before discharge on 7/18 he went into atrial fibrillation with RVR and his discharge was held.  He was treated with an amiodarone drip and converted to normal sinus rhythm.  He was transitioned to oral amiodarone and his Lopressor was increased to 50 mg twice daily.  He was then discharged on p.o. amiodarone.  He was seen 8/23 by myself and he had been feeling pretty good without any fluttering feeling in his chest.  He has been walking around his house because it has been too hot outside.  He feels good so he is hoping he can drive pretty soon.  He has his follow-up appointment with Dr. Cyndia Bent next week and he plans to bring this up at that time.  We discussed taking plain old Mucinex for his congestion.  He states he does still have some shortness of breath from time to time but this could be due to exercise intolerance.  We reviewed his chest x-ray from right before discharge and he did have a small left oral effusion but nothing remarkable.  He continues to use his incentive spirometer.  His incisions are all healing well.  We did remove a small suture  in the center of his sternotomy incision.  Today, he states he is feeling okay, no further dizziness. He does have really bad eyes per the patient. He is walking and does an acre hike around his yard. He did have afib in the hospital and he did get medication (Amio). He is unsure if he is still taking this medication but if he is he was advised to stop it. His BP is high since he has not taken his medication this morning. It is usually 140/80 per patient report. We discussed meeting with pharmacy and for him to bring all his pill bottles to that appointment so we can sort out what he is and isn't taking.  Reports no shortness of breath nor dyspnea on exertion. Reports no chest pain, pressure, or tightness. No edema, orthopnea, PND. Reports no palpitations.     Past Medical History    Past Medical History:  Diagnosis Date   Anxiety    Aortic stenosis    Coronary artery disease    a. s/p CABG in 12/2016 with LIMA-LAD, reverse SVG-OM, and reverse SVG-PDA   GERD (gastroesophageal reflux disease)    occasionally   Heart murmur    Hyperlipidemia    Hypertension    Left bundle branch block (LBBB)    Past Surgical History:  Procedure Laterality Date   AORTIC VALVE REPLACEMENT N/A 02/27/2022   Procedure: AORTIC VALVE  REPLACEMENT (AVR) USING 21 INSPIRIS;  Surgeon: Gaye Pollack, MD;  Location: [facility];  Service: Open Heart Surgery;  Laterality: N/A;   CARDIAC CATHETERIZATION     CORONARY ARTERY BYPASS GRAFT N/A 12/26/2016   Procedure: CORONARY ARTERY BYPASS GRAFTING (CABG) x 3 WITH ENDOSCOPIC HARVESTING OF RIGHT SAPHENOUS VEIN;  Surgeon: Grace Isaac, MD;  Location: [facility];  Service: Open Heart Surgery;  Laterality: N/A;   CORONARY ARTERY BYPASS GRAFT N/A 02/27/2022   Procedure: REDO CORONARY ARTERY BYPASS GRAFTING (CABG) X3 USING LEFT RADIAL ARTERY AND LEFT GREATER SAPHENOUS VEIN, HARVESTED ENDOSCOPICALLY;  Surgeon: Gaye Pollack, MD;  Location: [place];  Service: Open Heart Surgery;   Laterality: N/A;   LEFT HEART CATH AND CORONARY ANGIOGRAPHY N/A 12/12/2016   Procedure: Left Heart Cath and Coronary Angiography;  Surgeon: Belva Crome, MD;  Location: [place];  Service: Cardiovascular;  Laterality: N/A;   none     none   RADIAL ARTERY HARVEST Left 02/27/2022   Procedure: RADIAL ARTERY HARVEST;  Surgeon: Gaye Pollack, MD;  Location: [place] [facility];  Service: Open Heart Surgery;  Laterality: Left;   RIGHT/LEFT HEART CATH AND CORONARY/GRAFT ANGIOGRAPHY N/A 01/24/2022   Procedure: RIGHT/LEFT HEART CATH AND CORONARY/GRAFT ANGIOGRAPHY;  Surgeon: Burnell Blanks, MD;  Location: [place] [facility];  Service: Cardiovascular;  Laterality: N/A;   TEE WITHOUT CARDIOVERSION N/A 12/26/2016   Procedure: TRANSESOPHAGEAL ECHOCARDIOGRAM (TEE);  Surgeon: Grace Isaac, MD;  Location: [place];  Service: Open Heart Surgery;  Laterality: N/A;   TEE WITHOUT CARDIOVERSION N/A 02/27/2022   Procedure: TRANSESOPHAGEAL ECHOCARDIOGRAM (TEE);  Surgeon: Gaye Pollack, MD;  Location: [place];  Service: Open Heart Surgery;  Laterality: N/A;   WISDOM TOOTH EXTRACTION      Allergies  No Active Allergies  EKGs/Labs/Other Studies Reviewed:   The following studies were reviewed today:  Echocardiogram 04/10/22   IMPRESSIONS     1. Left ventricular ejection fraction, by estimation, is 55%. The left  ventricle has low normal function. The left ventricle has no regional wall  motion abnormalities. Left ventricular diastolic parameters are consistent  with Grade II diastolic  dysfunction (pseudonormalization). Elevated left atrial pressure.   2. Right ventricular systolic function is normal. The right ventricular  size is normal. There is normal pulmonary artery systolic pressure.   3. Left atrial size was mildly dilated.   4. The mitral valve is normal in structure. No evidence of mitral valve  regurgitation. No evidence of mitral stenosis.   5. The tricuspid valve is abnormal.   6.  21 mm Edwards INSPIRIS RESILIA pericardial valve is in the AV position  with normal function. The aortic valve has been repaired/replaced. Aortic  valve regurgitation is not visualized. No aortic stenosis is present.   7. The inferior vena cava is dilated in size with >50% respiratory  variability, suggesting right atrial pressure of 8 mmHg.   FINDINGS   Left Ventricle: Left ventricular ejection fraction, by estimation, is  55%. The left ventricle has low normal function. The left ventricle has no  regional wall motion abnormalities. The left ventricular internal cavity  size was normal in size. There is  no left ventricular hypertrophy. Left ventricular diastolic parameters are  consistent with Grade II diastolic dysfunction (pseudonormalization).  Elevated left atrial pressure.   Right Ventricle: The right ventricular size is normal. Right vetricular  wall thickness was not well visualized. Right ventricular systolic  function is normal. There is normal pulmonary artery  systolic pressure.  The tricuspid regurgitant velocity is 2.49  m/s, and with an assumed right atrial pressure of 8 mmHg, the estimated  right ventricular systolic pressure is 0000000 mmHg.   Left Atrium: Left atrial size was mildly dilated.   Right Atrium: Right atrial size was normal in size.   Pericardium: There is no evidence of pericardial effusion.   Mitral Valve: The mitral valve is normal in structure. No evidence of  mitral valve regurgitation. No evidence of mitral valve stenosis.   Tricuspid Valve: The tricuspid valve is abnormal. Tricuspid valve  regurgitation is mild . No evidence of tricuspid stenosis.   Aortic Valve: 21 mm Edwards INSPIRIS RESILIA pericardial valve is in the  AV position with normal function. The aortic valve has been  repaired/replaced. Aortic valve regurgitation is not visualized. No aortic  stenosis is present. Aortic valve mean  gradient measures 7.5 mmHg. Aortic valve peak  gradient measures 13.8 mmHg.  Aortic valve area, by VTI measures 1.47 cm.   Pulmonic Valve: The pulmonic valve was not well visualized. Pulmonic valve  regurgitation is not visualized. No evidence of pulmonic stenosis.   Aorta: The aortic root is normal in size and structure.   Venous: The inferior vena cava is dilated in size with greater than 50%  respiratory variability, suggesting right atrial pressure of 8 mmHg.   IAS/Shunts: No atrial level shunt detected by color flow Doppler.       EKG:  EKG is  ordered today.   Recent Labs: 02/25/2022: ALT 28 03/02/2022: Hemoglobin 8.9; Platelets 105 03/04/2022: Magnesium 1.9 03/05/2022: BUN 20; Creatinine, Ser 1.14; Potassium 5.5; Sodium 138  Recent Lipid Panel    Component Value Date/Time   CHOL 155 07/04/2019 0812   TRIG 116 07/04/2019 0812   HDL 45 07/04/2019 0812   CHOLHDL 3.4 07/04/2019 0812   VLDL 19 02/02/2017 0839   LDLCALC 89 07/04/2019 0812    Home Medications   Current Meds  Medication Sig   acetaminophen (TYLENOL) 500 MG tablet Take 1,000 mg by mouth 2 (two) times daily.   amLODipine (NORVASC) 2.5 MG tablet Take 1 tablet (2.5 mg total) by mouth at bedtime.   aspirin EC 325 MG tablet Take 1 tablet (325 mg total) by mouth daily.   escitalopram (LEXAPRO) 10 MG tablet Take 10 mg by mouth at bedtime.   LORazepam (ATIVAN) 0.5 MG tablet Take 1 tablet (0.5 mg total) by mouth 2 (two) times daily as needed. for anxiety (Patient taking differently: Take 0.5 mg by mouth daily.)   metoprolol tartrate (LOPRESSOR) 50 MG tablet Take 1 tablet (50 mg total) by mouth 2 (two) times daily.   rosuvastatin (CRESTOR) 40 MG tablet TAKE 1 TABLET BY MOUTH EVERY DAY   traMADol (ULTRAM) 50 MG tablet Take 1 tablet (50 mg total) by mouth every 6 (six) hours as needed for moderate pain.   [DISCONTINUED] amiodarone (PACERONE) 200 MG tablet Take 2 tablets (400 mg total) by mouth 2 (two) times daily for 5 days; then take 200 mg twice daily for one week;  then take 200 mg daily thereafter     Review of Systems      All other systems reviewed and are otherwise negative except as noted above.  Physical Exam    VS:  BP (!) 154/80   Pulse 62   Ht 5\' 10"  (1.778 m)   Wt 199 lb 3.2 oz (90.4 kg)   SpO2 96%   BMI 28.58 kg/m  , BMI Body mass  index is 28.58 kg/m.  Wt Readings from Last 3 Encounters:  07/21/22 199 lb 3.2 oz (90.4 kg)  04/30/22 191 lb (86.6 kg)  04/09/22 191 lb (86.6 kg)     GEN: Well nourished, well developed, in no acute distress. HEENT: normal. Neck: Supple, no JVD, carotid bruits, or masses. Cardiac: RRR, 2/6 systolic murmurs, rubs, or gallops. No clubbing, cyanosis, edema.  Radials/PT 2+ and equal bilaterally.  Respiratory:  Respirations regular and unlabored, clear to auscultation bilaterally. GI: Soft, nontender, nondistended. MS: No deformity or atrophy. Skin: Warm and dry, no rash. Neuro:  Strength and sensation are intact. Psych: Normal affect. Incision: Suture from the center of the sternotomy incision was removed today.  Incisions are clean, dry, intact without any signs of infection.  Left radial harvest with some minor edema as well as his left lower leg  Assessment & Plan    CAD status post redo CABG x3, severe AS status post redo AVR -Feels okay, able to walk an acre without symptoms -Continue current medications: Norvasc 2.5 mg (radial artery harvest) aspirin 81 mg daily, Lopressor 50 mg twice daily, and Crestor 40 mg daily -stop Amio if still taking   Postop Atrial fibrillation -He has not had any of the fluttering feeling that he was having in the hospital -discontinue Amio -continue Metoprolol 50mg  BID (there is some confusion with the dose and frequency of this medication)  Hypertension -elevated today but patient has not taken his medication today -asked him to take BP an hour after morning medications and record for a week -appointment made with pharm to straighten out  medications  Hyperlipidemia -Will need a follow-up lipid panel during his next appointment  Disposition: Follow up 3 months with , MD or APP.  Signed, Charlton Haws, PA-C 07/21/2022, 1:01 PM [facility]

## 2022-07-21 ENCOUNTER — Ambulatory Visit: Payer: Medicare HMO | Attending: Physician Assistant | Admitting: Physician Assistant

## 2022-07-21 ENCOUNTER — Encounter: Payer: Self-pay | Admitting: Physician Assistant

## 2022-07-21 VITALS — BP 154/80 | HR 62 | Ht 70.0 in | Wt 199.2 lb

## 2022-07-21 DIAGNOSIS — E785 Hyperlipidemia, unspecified: Secondary | ICD-10-CM | POA: Diagnosis not present

## 2022-07-21 DIAGNOSIS — I10 Essential (primary) hypertension: Secondary | ICD-10-CM | POA: Diagnosis not present

## 2022-07-21 DIAGNOSIS — Z952 Presence of prosthetic heart valve: Secondary | ICD-10-CM | POA: Diagnosis not present

## 2022-07-21 DIAGNOSIS — Z79899 Other long term (current) drug therapy: Secondary | ICD-10-CM | POA: Diagnosis not present

## 2022-07-21 DIAGNOSIS — I4891 Unspecified atrial fibrillation: Secondary | ICD-10-CM

## 2022-07-21 DIAGNOSIS — I2510 Atherosclerotic heart disease of native coronary artery without angina pectoris: Secondary | ICD-10-CM

## 2022-07-21 NOTE — Patient Instructions (Signed)
Medication Instructions:  1.Stop amiodarone *If you need a refill on your cardiac medications before your next appointment, please call your pharmacy*   Lab Work: None If you have labs (blood work) drawn today and your tests are completely normal, you will receive your results only by: MyChart Message (if you have MyChart) OR A paper copy in the mail If you have any lab test that is abnormal or we need to change your treatment, we will call you to review the results.   Follow-Up: At [facility], you and your health needs are our priority.  As part of our continuing mission to provide you with exceptional heart care, we have created designated Provider Care Teams.  These Care Teams include your primary Cardiologist (physician) and Advanced Practice Providers (APPs -  Physician Assistants and Nurse Practitioners) who all work together to provide you with the care you need, when you need it.   Your next appointment:   3 month(s)  The format for your next appointment:   In Person  Provider:   Charlton Haws, MD   Other Instructions 1.You have been referred to see the pharmacist here in our office for medication management. 2.Check your blood pressure daily, one hour after taking your morning medication for the next week and send [place] the readings through mychart.  Important Information About Sugar

## 2022-08-19 DIAGNOSIS — E118 Type 2 diabetes mellitus with unspecified complications: Secondary | ICD-10-CM | POA: Diagnosis not present

## 2022-09-01 ENCOUNTER — Ambulatory Visit: Payer: Medicare HMO | Attending: Cardiovascular Disease | Admitting: Pharmacist

## 2022-09-01 VITALS — BP 128/72 | HR 60

## 2022-09-01 DIAGNOSIS — E785 Hyperlipidemia, unspecified: Secondary | ICD-10-CM

## 2022-09-01 DIAGNOSIS — I2510 Atherosclerotic heart disease of native coronary artery without angina pectoris: Secondary | ICD-10-CM

## 2022-09-01 DIAGNOSIS — I10 Essential (primary) hypertension: Secondary | ICD-10-CM | POA: Diagnosis not present

## 2022-09-01 LAB — COMPREHENSIVE METABOLIC PANEL
ALT: 27 IU/L (ref 0–44)
AST: 30 IU/L (ref 0–40)
Albumin/Globulin Ratio: 1.7 (ref 1.2–2.2)
Albumin: 4.3 g/dL (ref 3.9–4.9)
Alkaline Phosphatase: 59 IU/L (ref 44–121)
BUN/Creatinine Ratio: 15 (ref 10–24)
BUN: 19 mg/dL (ref 8–27)
Bilirubin Total: 0.4 mg/dL (ref 0.0–1.2)
CO2: 27 mmol/L (ref 20–29)
Calcium: 10 mg/dL (ref 8.6–10.2)
Chloride: 98 mmol/L (ref 96–106)
Creatinine, Ser: 1.25 mg/dL (ref 0.76–1.27)
Globulin, Total: 2.5 g/dL (ref 1.5–4.5)
Glucose: 111 mg/dL — ABNORMAL HIGH (ref 70–99)
Potassium: 4.3 mmol/L (ref 3.5–5.2)
Sodium: 139 mmol/L (ref 134–144)
Total Protein: 6.8 g/dL (ref 6.0–8.5)
eGFR: 64 mL/min/{1.73_m2} (ref >59)

## 2022-09-01 LAB — LIPID PANEL
Chol/HDL Ratio: 3.5 ratio (ref 0.0–5.0)
Cholesterol, Total: 141 mg/dL (ref 100–199)
HDL: 40 mg/dL (ref >39)
LDL Chol Calc (NIH): 76 mg/dL (ref 0–99)
Triglycerides: 142 mg/dL (ref 0–149)
VLDL Cholesterol Cal: 25 mg/dL (ref 5–40)

## 2022-09-01 NOTE — Patient Instructions (Addendum)
Your blood pressure is excellent today at goal < 130/38mmHg  Continue taking your current medications  Limit your salt to 000mg  daily

## 2022-09-01 NOTE — Progress Notes (Signed)
Patient ID: Samuel Cook                 DOB: 1956-04-02                      MRN: [id number]     HPI: Samuel Cook is a 67 y.o. male patient of Dr Johnsie Cancel referred by Nicholes Rough, PA to HTN clinic. PMH is significant for HTN, HLD, CAD s/p CABG x3 in 12/2016, afib, aortic stenosis, and aortic valve replacement. BP elevated at last visit in office on 07/21/22, pt hadn't taken his medications that morning. Reported home readings usually 140/80 and pt advised to bring in pill bottles to appt today to sort through what he is/isn't taking.  Pt presents today for follow up. Brings in bag with all of his medications. Many discrepancies noted from what our med list is showing. His PCP isn't in Epic so PCP-prescribed meds hadn't been on our list.  Our med list showed pt taking amlodipine 2.5mg  daily and metoprolol tartrate 25mg  BID.  He is instead taking amlodipine 5mg  daily, nebivolol 10mg  daily, as well as lisinopril 20mg  daily and chlorthalidone 25mg  daily, neither of which we had listed.  He increased his ASA up to 325mg  per TCTS note 04/30/22.  Reports white coat HTN and home systolic 235-573. Drank 1 cup of coffee this AM, usually has 2. BP better when he isn't anxious. Diet likely high in sodium, "country cooking." Took his BP meds this AM. Previously had issue with med refills at the pharmacy but sounds like this has been sorted out.  Current HTN meds: amlodipine 5mg  daily, lisinopril 20 mg daily, chlorthalidone 25mg  daily, nebivolol 10mg  daily BP goal: <130/31mmHg  Family History: Father with stroke, maternal grandmother with heart disease.  Social History: former smoker 1 PPD for 20 years, quit in 1995. Rare alcohol use, occasional marijuana use.  Diet: goes for whole wheat bread. 2 cups of coffee a day. "Country cooking."  Exercise: walks an acre and a half on his land  Wt Readings from Last 3 Encounters:  07/21/22 199 lb 3.2 oz (90.4 kg)  04/30/22 191 lb (86.6 kg)  04/09/22 191 lb (86.6  kg)   BP Readings from Last 3 Encounters:  07/21/22 (!) 154/80  04/30/22 (!) 145/68  04/09/22 115/75   Pulse Readings from Last 3 Encounters:  07/21/22 62  04/30/22 (!) 59  04/09/22 61    Renal function: CrCl cannot be calculated (Patient's most recent lab result is older than the maximum 21 days allowed.).  Past Medical History:  Diagnosis Date   Anxiety    Aortic stenosis    Coronary artery disease    a. s/p CABG in 12/2016 with LIMA-LAD, reverse SVG-OM, and reverse SVG-PDA   GERD (gastroesophageal reflux disease)    occasionally   Heart murmur    Hyperlipidemia    Hypertension    Left bundle branch block (LBBB)     Current Outpatient Medications on File Prior to Visit  Medication Sig Dispense Refill   acetaminophen (TYLENOL) 500 MG tablet Take 1,000 mg by mouth 2 (two) times daily.     amLODipine (NORVASC) 2.5 MG tablet Take 1 tablet (2.5 mg total) by mouth at bedtime. 30 tablet 1   aspirin EC 325 MG tablet Take 1 tablet (325 mg total) by mouth daily.     escitalopram (LEXAPRO) 10 MG tablet Take 10 mg by mouth at bedtime.     LORazepam (ATIVAN) 0.5 MG  tablet Take 1 tablet (0.5 mg total) by mouth 2 (two) times daily as needed. for anxiety (Patient taking differently: Take 0.5 mg by mouth daily.) 60 tablet 0   metoprolol tartrate (LOPRESSOR) 50 MG tablet Take 1 tablet (50 mg total) by mouth 2 (two) times daily. 60 tablet 1   rosuvastatin (CRESTOR) 40 MG tablet TAKE 1 TABLET BY MOUTH EVERY DAY 90 tablet 0   traMADol (ULTRAM) 50 MG tablet Take 1 tablet (50 mg total) by mouth every 6 (six) hours as needed for moderate pain. 30 tablet 0   No current facility-administered medications on file prior to visit.    No Active Allergies   Assessment/Plan:  1. Hypertension - BP improved and now at goal < 130/27mmHg. Many med discrepancies noted between our med list and what his PCP has been prescribing who isn't in Epic. Med list has been updated and now reflects pt's current  regimen of amlodipine 5mg  daily, lisinopril 20mg  daily, chlorthalidone 25mg  daily, and nebivolol 10mg  daily. Will continue current meds. Will check CMET today.  2. Hyperlipidemia - Pt taking rosuvastatin 40mg  daily. LDL goal < 55 given premature ASCVD. Will check updated lipids today.  F/u with PharmD as needed.  Shantelle Alles E. Evalee Gerard, PharmD, BCACP, [facility]. [address], [place],  [address] Phone: [phone]; Fax: [phone] 09/01/2022 10:14 AM

## 2022-09-02 ENCOUNTER — Telehealth: Payer: Self-pay | Admitting: Pharmacist

## 2022-09-02 DIAGNOSIS — E785 Hyperlipidemia, unspecified: Secondary | ICD-10-CM

## 2022-09-02 NOTE — Telephone Encounter (Signed)
LDL back at 76 on rosuvastatin 40mg  daily above goal < 55 given premature ASCVD. Will plan to add on ezetimibe 10mg  daily.  Called pt to discuss, no answer, VM full. Will try back later.

## 2022-09-05 ENCOUNTER — Encounter: Payer: Self-pay | Admitting: Pharmacist

## 2022-09-05 MED ORDER — EZETIMIBE 10 MG PO TABS: 10.0000 mg | ORAL_TABLET | Freq: Every day | ORAL | 3 refills | Status: DC

## 2022-09-05 NOTE — Telephone Encounter (Signed)
2nd call made, # goes straight to VM which is still full. Will send pt mychart message.  Rx for ezetimibe sent to pharmacy, added on lab to recheck lipids when pt sees Dr Johnsie Cancel for follow up in March.

## 2022-09-09 DIAGNOSIS — I10 Essential (primary) hypertension: Secondary | ICD-10-CM | POA: Diagnosis not present

## 2022-09-09 DIAGNOSIS — R7309 Other abnormal glucose: Secondary | ICD-10-CM | POA: Diagnosis not present

## 2022-09-09 DIAGNOSIS — E1169 Type 2 diabetes mellitus with other specified complication: Secondary | ICD-10-CM | POA: Diagnosis not present

## 2022-10-13 NOTE — Progress Notes (Signed)
Office Visit    Patient Name: Samuel Cook Date of Encounter: 10/27/2022  PCP:  Asencion Noble, [facility]  Cardiologist:  Jenkins Rouge, MD  Advanced Practice Provider:  No care team member to display Electrophysiologist:  None   HPI    Samuel Cook is a 67 y.o. male with a past medical history significant for hypertension, hyperlipidemia, and CAD status post CABG x3 by Dr. Servando Snare in 12/2016 (LIMA to LAD, SVG to OM, and SVG to PDA), and aortic stenosis presents today for hospital follow-up.  The patient was admitted electively and taken to the OR 02/27/2022 and underwent a redo median sternotomy for CABG x3 and aortic valve replacement.  Prior SVGls occluded and only patient LIMA on cath 01/24/22 This involved a left radial graft to the OM, SVG to Ramus and SVG to PDA AVR with 21 mm Edwards Resilia pericardial vavle. Brief PAF Rx with amiodarone now d/c   Post op TTE done 04/10/22 with EF 55% mean AV gradient 7.5 mmHg peak 13.8 mmHg with no PVL   Reports no shortness of breath nor dyspnea on exertion. Reports no chest pain, pressure, or tightness. No edema, orthopnea, PND. Reports no palpitations.   Discussed issues with I'm not starting Zetia to get LDL at target    Past Medical History    Past Medical History:  Diagnosis Date   Anxiety    Aortic stenosis    Coronary artery disease    a. s/p CABG in 12/2016 with LIMA-LAD, reverse SVG-OM, and reverse SVG-PDA   GERD (gastroesophageal reflux disease)    occasionally   Heart murmur    Hyperlipidemia    Hypertension    Left bundle branch block (LBBB)    Past Surgical History:  Procedure Laterality Date   AORTIC VALVE REPLACEMENT N/A 02/27/2022   Procedure: AORTIC VALVE REPLACEMENT (AVR) USING 21 INSPIRIS;  Surgeon: Gaye Pollack, MD;  Location: [place];  Service: Open Heart Surgery;  Laterality: N/A;   CARDIAC CATHETERIZATION     CORONARY ARTERY BYPASS GRAFT N/A 12/26/2016   Procedure: CORONARY  ARTERY BYPASS GRAFTING (CABG) x 3 WITH ENDOSCOPIC HARVESTING OF RIGHT SAPHENOUS VEIN;  Surgeon: Grace Isaac, MD;  Location: [place];  Service: Open Heart Surgery;  Laterality: N/A;   CORONARY ARTERY BYPASS GRAFT N/A 02/27/2022   Procedure: REDO CORONARY ARTERY BYPASS GRAFTING (CABG) X3 USING LEFT RADIAL ARTERY AND LEFT GREATER SAPHENOUS VEIN, HARVESTED ENDOSCOPICALLY;  Surgeon: Gaye Pollack, MD;  Location: [place];  Service: Open Heart Surgery;  Laterality: N/A;   LEFT HEART CATH AND CORONARY ANGIOGRAPHY N/A 12/12/2016   Procedure: Left Heart Cath and Coronary Angiography;  Surgeon: Belva Crome, MD;  Location: [place] [facility];  Service: Cardiovascular;  Laterality: N/A;   none     none   RADIAL ARTERY HARVEST Left 02/27/2022   Procedure: RADIAL ARTERY HARVEST;  Surgeon: Gaye Pollack, MD;  Location: [place];  Service: Open Heart Surgery;  Laterality: Left;   RIGHT/LEFT HEART CATH AND CORONARY/GRAFT ANGIOGRAPHY N/A 01/24/2022   Procedure: RIGHT/LEFT HEART CATH AND CORONARY/GRAFT ANGIOGRAPHY;  Surgeon: Burnell Blanks, MD;  Location: [place] [facility];  Service: Cardiovascular;  Laterality: N/A;   TEE WITHOUT CARDIOVERSION N/A 12/26/2016   Procedure: TRANSESOPHAGEAL ECHOCARDIOGRAM (TEE);  Surgeon: Grace Isaac, MD;  Location: [place];  Service: Open Heart Surgery;  Laterality: N/A;   TEE WITHOUT CARDIOVERSION N/A 02/27/2022   Procedure: TRANSESOPHAGEAL ECHOCARDIOGRAM (TEE);  Surgeon: Gaye Pollack, MD;  Location: [place];  Service: Open Heart Surgery;  Laterality: N/A;   WISDOM TOOTH EXTRACTION      Allergies  No Active Allergies  EKGs/Labs/Other Studies Reviewed:   The following studies were reviewed today:  Echocardiogram 04/10/22   IMPRESSIONS     1. Left ventricular ejection fraction, by estimation, is 55%. The left  ventricle has low normal function. The left ventricle has no regional wall  motion abnormalities. Left ventricular diastolic parameters are  consistent  with Grade II diastolic  dysfunction (pseudonormalization). Elevated left atrial pressure.   2. Right ventricular systolic function is normal. The right ventricular  size is normal. There is normal pulmonary artery systolic pressure.   3. Left atrial size was mildly dilated.   4. The mitral valve is normal in structure. No evidence of mitral valve  regurgitation. No evidence of mitral stenosis.   5. The tricuspid valve is abnormal.   6. 21 mm Edwards INSPIRIS RESILIA pericardial valve is in the AV position  with normal function. The aortic valve has been repaired/replaced. Aortic  valve regurgitation is not visualized. No aortic stenosis is present.   7. The inferior vena cava is dilated in size with >50% respiratory  variability, suggesting right atrial pressure of 8 mmHg.   FINDINGS   Left Ventricle: Left ventricular ejection fraction, by estimation, is  55%. The left ventricle has low normal function. The left ventricle has no  regional wall motion abnormalities. The left ventricular internal cavity  size was normal in size. There is  no left ventricular hypertrophy. Left ventricular diastolic parameters are  consistent with Grade II diastolic dysfunction (pseudonormalization).  Elevated left atrial pressure.   Right Ventricle: The right ventricular size is normal. Right vetricular  wall thickness was not well visualized. Right ventricular systolic  function is normal. There is normal pulmonary artery systolic pressure.  The tricuspid regurgitant velocity is 2.49  m/s, and with an assumed right atrial pressure of 8 mmHg, the estimated  right ventricular systolic pressure is 01.7 mmHg.   Left Atrium: Left atrial size was mildly dilated.   Right Atrium: Right atrial size was normal in size.   Pericardium: There is no evidence of pericardial effusion.   Mitral Valve: The mitral valve is normal in structure. No evidence of  mitral valve regurgitation. No evidence of  mitral valve stenosis.   Tricuspid Valve: The tricuspid valve is abnormal. Tricuspid valve  regurgitation is mild . No evidence of tricuspid stenosis.   Aortic Valve: 21 mm Edwards INSPIRIS RESILIA pericardial valve is in the  AV position with normal function. The aortic valve has been  repaired/replaced. Aortic valve regurgitation is not visualized. No aortic  stenosis is present. Aortic valve mean  gradient measures 7.5 mmHg. Aortic valve peak gradient measures 13.8 mmHg.  Aortic valve area, by VTI measures 1.47 cm.   Pulmonic Valve: The pulmonic valve was not well visualized. Pulmonic valve  regurgitation is not visualized. No evidence of pulmonic stenosis.   Aorta: The aortic root is normal in size and structure.   Venous: The inferior vena cava is dilated in size with greater than 50%  respiratory variability, suggesting right atrial pressure of 8 mmHg.   IAS/Shunts: No atrial level shunt detected by color flow Doppler.       EKG:   SR rate 62 LBBB 07/21/22   Recent Labs: 03/02/2022: Hemoglobin 8.9; Platelets 105 03/04/2022: Magnesium 1.9 09/01/2022: ALT 27; BUN 19; Creatinine, Ser 1.25; Potassium  4.3; Sodium 139  Recent Lipid Panel    Component Value Date/Time   CHOL 141 09/01/2022 1011   TRIG 142 09/01/2022 1011   HDL 40 09/01/2022 1011   CHOLHDL 3.5 09/01/2022 1011   CHOLHDL 3.4 07/04/2019 0812   VLDL 19 02/02/2017 0839   LDLCALC 76 09/01/2022 1011   LDLCALC 89 07/04/2019 0812    Home Medications   Current Meds  Medication Sig   acetaminophen (TYLENOL) 500 MG tablet Take 1,000 mg by mouth 2 (two) times daily.   amLODipine (NORVASC) 5 MG tablet Take 5 mg by mouth daily.   Apoaequorin (PREVAGEN) 10 MG CAPS Take 1 capsule by mouth daily.   aspirin EC 325 MG tablet Take 1 tablet (325 mg total) by mouth daily.   chlorthalidone (HYGROTON) 25 MG tablet Take 25 mg by mouth daily.   escitalopram (LEXAPRO) 10 MG tablet Take 10 mg by mouth at bedtime.   lisinopril  (ZESTRIL) 20 MG tablet Take 20 mg by mouth daily.   LORazepam (ATIVAN) 0.5 MG tablet Take 1 tablet (0.5 mg total) by mouth 2 (two) times daily as needed. for anxiety (Patient taking differently: Take 0.5 mg by mouth daily.)   nebivolol (BYSTOLIC) 10 MG tablet Take 10 mg by mouth daily.   rosuvastatin (CRESTOR) 40 MG tablet TAKE 1 TABLET BY MOUTH EVERY DAY     Review of Systems      All other systems reviewed and are otherwise negative except as noted above.  Physical Exam    VS:  BP (!) 155/84   Pulse (!) 56   Ht 5\' 10"  (1.778 m)   Wt 183 lb (83 kg)   BMI 26.26 kg/m  , BMI Body mass index is 26.26 kg/m.  Wt Readings from Last 3 Encounters:  10/27/22 183 lb (83 kg)  07/21/22 199 lb 3.2 oz (90.4 kg)  04/30/22 191 lb (86.6 kg)    Affect appropriate Healthy:  appears stated age 25: normal Neck supple with no adenopathy JVP normal no bruits no thyromegaly Lungs clear with no wheezing and good diaphragmatic motion Heart:  S1/S2 SEM through AVR no AR murmur, no rub, gallop or click PMI normal post sternotomy Abdomen: benighn, BS positve, no tenderness, no AAA no bruit.  No HSM or HJR Post left radial artery harvest  No edema Neuro non-focal Skin warm and dry No muscular weakness   Assessment & Plan    CAD status post redo CABG x3, severe AS status post redo AVR -Functional status ok  -no angina continue medical Rx  - EF normal and gradients good across AVR by TTE 04/10/22 -  SBE prophylaxis discussed   Postop Atrial fibrillation -Maintaining NSR amiodarone d/c  - post operative arrhythmia not on anticoagulation   Hypertension -improved  - continue current meds   Hyperlipidemia -LDL was 76 on 09/01/22 this was on 40 mg of Crestor - was supposed to start Zetia 10 mg 09/05/22 discussed this with him and script called in   Zetia 10 mg called back in   Disposition: Follow up in a year   Signed, Jenkins Rouge, MD 10/27/2022, 9:23 AM [place]

## 2022-10-27 ENCOUNTER — Ambulatory Visit: Payer: Medicare HMO

## 2022-10-27 ENCOUNTER — Ambulatory Visit: Payer: Medicare HMO | Attending: Cardiovascular Disease | Admitting: Cardiovascular Disease

## 2022-10-27 ENCOUNTER — Encounter: Payer: Self-pay | Admitting: Cardiovascular Disease

## 2022-10-27 VITALS — BP 155/84 | HR 56 | Ht 70.0 in | Wt 183.0 lb

## 2022-10-27 DIAGNOSIS — I4891 Unspecified atrial fibrillation: Secondary | ICD-10-CM

## 2022-10-27 DIAGNOSIS — E785 Hyperlipidemia, unspecified: Secondary | ICD-10-CM

## 2022-10-27 DIAGNOSIS — Z952 Presence of prosthetic heart valve: Secondary | ICD-10-CM

## 2022-10-27 DIAGNOSIS — I10 Essential (primary) hypertension: Secondary | ICD-10-CM | POA: Diagnosis not present

## 2022-10-27 DIAGNOSIS — E782 Mixed hyperlipidemia: Secondary | ICD-10-CM

## 2022-10-27 DIAGNOSIS — Z951 Presence of aortocoronary bypass graft: Secondary | ICD-10-CM | POA: Diagnosis not present

## 2022-10-27 MED ORDER — EZETIMIBE 10 MG PO TABS: 10.0000 mg | ORAL_TABLET | Freq: Every day | ORAL | 3 refills | Status: DC

## 2022-10-27 NOTE — Patient Instructions (Signed)
Medication Instructions:  Your physician has recommended you make the following change in your medication:  1-TAKE Zetia 10 mg by mouth daily.  *If you need a refill on your cardiac medications before your next appointment, please call your pharmacy*  Lab Work: If you have labs (blood work) drawn today and your tests are completely normal, you will receive your results only by: [place] (if you have MyChart) OR A paper copy in the mail If you have any lab test that is abnormal or we need to change your treatment, we will call you to review the results.  Testing/Procedures: None ordered today.  Follow-Up: At [facility], you and your health needs are our priority.  As part of our continuing mission to provide you with exceptional heart care, we have created designated Provider Care Teams.  These Care Teams include your primary Cardiologist (physician) and Advanced Practice Providers (APPs -  Physician Assistants and Nurse Practitioners) who all work together to provide you with the care you need, when you need it.  We recommend signing up for the patient portal called "MyChart".  Sign up information is provided on this After Visit Summary.  MyChart is used to connect with patients for Virtual Visits (Telemedicine).  Patients are able to view lab/test results, encounter notes, upcoming appointments, etc.  Non-urgent messages can be sent to your provider as well.   To learn more about what you can do with MyChart, go to [email or web address].    Your next appointment:   1 year(s)  Provider:   Jenkins Rouge, MD

## 2023-01-01 DIAGNOSIS — E118 Type 2 diabetes mellitus with unspecified complications: Secondary | ICD-10-CM | POA: Diagnosis not present

## 2023-01-01 DIAGNOSIS — Z125 Encounter for screening for malignant neoplasm of prostate: Secondary | ICD-10-CM | POA: Diagnosis not present

## 2023-01-01 DIAGNOSIS — E785 Hyperlipidemia, unspecified: Secondary | ICD-10-CM | POA: Diagnosis not present

## 2023-01-01 DIAGNOSIS — I2510 Atherosclerotic heart disease of native coronary artery without angina pectoris: Secondary | ICD-10-CM | POA: Diagnosis not present

## 2023-01-01 DIAGNOSIS — I700 Atherosclerosis of aorta: Secondary | ICD-10-CM | POA: Diagnosis not present

## 2023-01-01 DIAGNOSIS — I10 Essential (primary) hypertension: Secondary | ICD-10-CM | POA: Diagnosis not present

## 2023-01-08 DIAGNOSIS — I10 Essential (primary) hypertension: Secondary | ICD-10-CM | POA: Diagnosis not present

## 2023-01-08 DIAGNOSIS — E785 Hyperlipidemia, unspecified: Secondary | ICD-10-CM | POA: Diagnosis not present

## 2023-01-08 DIAGNOSIS — E1169 Type 2 diabetes mellitus with other specified complication: Secondary | ICD-10-CM | POA: Diagnosis not present

## 2023-01-08 DIAGNOSIS — R7309 Other abnormal glucose: Secondary | ICD-10-CM | POA: Diagnosis not present

## 2023-01-08 DIAGNOSIS — F411 Generalized anxiety disorder: Secondary | ICD-10-CM | POA: Diagnosis not present

## 2023-01-09 ENCOUNTER — Encounter: Payer: Self-pay | Admitting: Internal Medicine

## 2023-05-06 DIAGNOSIS — E118 Type 2 diabetes mellitus with unspecified complications: Secondary | ICD-10-CM | POA: Diagnosis not present

## 2023-05-13 DIAGNOSIS — M5440 Lumbago with sciatica, unspecified side: Secondary | ICD-10-CM | POA: Diagnosis not present

## 2023-05-13 DIAGNOSIS — Z23 Encounter for immunization: Secondary | ICD-10-CM | POA: Diagnosis not present

## 2023-05-13 DIAGNOSIS — I10 Essential (primary) hypertension: Secondary | ICD-10-CM | POA: Diagnosis not present

## 2023-05-13 DIAGNOSIS — E1169 Type 2 diabetes mellitus with other specified complication: Secondary | ICD-10-CM | POA: Diagnosis not present

## 2023-06-10 ENCOUNTER — Telehealth: Payer: Self-pay | Admitting: Cardiovascular Disease

## 2023-06-10 NOTE — Telephone Encounter (Signed)
Attempted to call patient mailbox full

## 2023-06-10 NOTE — Telephone Encounter (Signed)
Patient had open heart surgery in the past and, wants to know if its safe to lift weights Please advise

## 2023-06-11 NOTE — Telephone Encounter (Signed)
Attempted to call patient mailbox full

## 2023-06-12 NOTE — Telephone Encounter (Signed)
Attempted to call patient mailbox full

## 2023-09-07 DIAGNOSIS — E118 Type 2 diabetes mellitus with unspecified complications: Secondary | ICD-10-CM | POA: Diagnosis not present

## 2023-09-16 DIAGNOSIS — I10 Essential (primary) hypertension: Secondary | ICD-10-CM | POA: Diagnosis not present

## 2023-09-16 DIAGNOSIS — E1169 Type 2 diabetes mellitus with other specified complication: Secondary | ICD-10-CM | POA: Diagnosis not present

## 2023-09-16 DIAGNOSIS — F411 Generalized anxiety disorder: Secondary | ICD-10-CM | POA: Diagnosis not present

## 2023-10-13 NOTE — Progress Notes (Signed)
 Office Visit    Patient Name: Samuel Cook Date of Encounter: 10/22/2023  PCP:  Carylon Perches, MD   [facility]  Cardiologist:  Charlton Haws, MD    HPI    Samuel Cook is a 68 y.o. male with a past medical history significant for hypertension, hyperlipidemia, and CAD status post CABG x3 by Dr. Tyrone Sage in 12/2016 (LIMA to LAD, SVG to OM, and SVG to PDA), and aortic stenosis presents today for  follow-up.  The patient was admitted electively and taken to the OR 02/27/2022 and underwent a redo median sternotomy for CABG x3 and aortic valve replacement.  Prior SVGls occluded and only patient LIMA on cath 01/24/22 This involved a left radial graft to the OM, SVG to Ramus and SVG to PDA AVR with 21 mm Edwards Resilia pericardial vavle. Brief PAF Rx with amiodarone now d/c   Post op TTE done 04/10/22 with EF 55% mean AV gradient 7.5 mmHg peak 13.8 mmHg with no PVL   Reports no shortness of breath nor dyspnea on exertion. Reports no chest pain, pressure, or tightness. No edema, orthopnea, PND. Reports no palpitations.   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 Wants to dig post holes and build a shop at his house. His sternum is a bit depressed but stable and I think it is fine for him to do this and lift light weights   Past Medical History    Past Medical History:  Diagnosis Date   Anxiety    Aortic stenosis    Coronary artery disease    a. s/p CABG in 12/2016 with LIMA-LAD, reverse SVG-OM, and reverse SVG-PDA   GERD (gastroesophageal reflux disease)    occasionally   Heart murmur    Hyperlipidemia    Hypertension    Left bundle branch block (LBBB)    Past Surgical History:  Procedure Laterality Date   AORTIC VALVE REPLACEMENT N/A 02/27/2022   Procedure: AORTIC VALVE REPLACEMENT (AVR) USING 21 INSPIRIS;  Surgeon: [name], MD;  Location[facility];  Service: Open Heart Surgery;  Laterality: N/A;   CARDIAC CATHETERIZATION      CORONARY ARTERY BYPASS GRAFT N/A 12/26/2016   Procedure: CORONARY ARTERY BYPASS GRAFTING (CABG) x 3 WITH ENDOSCOPIC HARVESTING OF RIGHT SAPHENOUS VEIN;  Surgeon: Delight Ovens, MD;  [facility];  Service: Open Heart Surgery;  Laterality: N/A;   CORONARY ARTERY BYPASS GRAFT N/A 02/27/2022   Procedure: REDO CORONARY ARTERY BYPASS GRAFTING (CABG) X3 USING LEFT RADIAL ARTERY AND LEFT GREATER SAPHENOUS VEIN, HARVESTED ENDOSCOPICALLY;  Surgeon: [name], MD;  Location: [facility];  Service: Open Heart Surgery;  Laterality: N/A;   LEFT HEART CATH AND CORONARY ANGIOGRAPHY N/A 12/12/2016   Procedure: Left Heart Cath and Coronary Angiography;  Surgeon: Lyn Records, MD;  Location: [facility] INVASIVE CV LAB;  Service: Cardiovascular;  Laterality: N/A;   none     none   RADIAL ARTERY HARVEST Left 02/27/2022   Procedure: RADIAL ARTERY HARVEST;  Surgeon: [name], MD;  Location: [facility];  Service: Open Heart Surgery;  Laterality: Left;   RIGHT/LEFT HEART CATH AND CORONARY/GRAFT ANGIOGRAPHY N/A 01/24/2022   Procedure: RIGHT/LEFT HEART CATH AND CORONARY/GRAFT ANGIOGRAPHY;  Surgeon: Kathleene Hazel, MD;  Location: [facility]AS[facility];  Service: Cardiovascular;  Laterality: N/A;   TEE WITHOUT CARDIOVERSION N/A 12/26/2016   Procedure: TRANSESOPHAGEAL ECHOCARDIOGRAM (TEE);  Surgeon: Delight Ovens, MD;  Location: [facility];  Service: Open Heart Surgery;  Laterality: N/A;   TEE WITHOUT CARDIOVERSION N/A 02/27/2022   Procedure: TRANSESOPHAGEAL ECHOCARDIOGRAM (TEE);  Surgeon: [name], MD;  [facility];  Service: Open Heart Surgery;  Laterality: N/A;   WISDOM TOOTH EXTRACTION      Allergies  No Known Allergies  EKGs/Labs/Other Studies Reviewed:   The following studies were reviewed today:  Echocardiogram 04/10/22   IMPRESSIONS     1. Left ventricular ejection fraction, by estimation, is 55%. The left  ventricle has low normal function. The left ventricle has no regional wall   motion abnormalities. Left ventricular diastolic parameters are consistent  with Grade II diastolic  dysfunction (pseudonormalization). Elevated left atrial pressure.   2. Right ventricular systolic function is normal. The right ventricular  size is normal. There is normal pulmonary artery systolic pressure.   3. Left atrial size was mildly dilated.   4. The mitral valve is normal in structure. No evidence of mitral valve  regurgitation. No evidence of mitral stenosis.   5. The tricuspid valve is abnormal.   6. 21 mm Edwards INSPIRIS RESILIA pericardial valve is in the AV position  with normal function. The aortic valve has been repaired/replaced. Aortic  valve regurgitation is not visualized. No aortic stenosis is present.   7. The inferior vena cava is dilated in size with >50% respiratory  variability, suggesting right atrial pressure of 8 mmHg.   FINDINGS   Left Ventricle: Left ventricular ejection fraction, by estimation, is  55%. The left ventricle has low normal function. The left ventricle has no  regional wall motion abnormalities. The left ventricular internal cavity  size was normal in size. There is  no left ventricular hypertrophy. Left ventricular diastolic parameters are  consistent with Grade II diastolic dysfunction (pseudonormalization).  Elevated left atrial pressure.   Right Ventricle: The right ventricular size is normal. Right vetricular  wall thickness was not well visualized. Right ventricular systolic  function is normal. There is normal pulmonary artery systolic pressure.  The tricuspid regurgitant velocity is 2.49  m/s, and with an assumed right atrial pressure of 8 mmHg, the estimated  right ventricular systolic pressure is 32.8 mmHg.   Left Atrium: Left atrial size was mildly dilated.   Right Atrium: Right atrial size was normal in size.   Pericardium: There is no evidence of pericardial effusion.   Mitral Valve: The mitral valve is normal in  structure. No evidence of  mitral valve regurgitation. No evidence of mitral valve stenosis.   Tricuspid Valve: The tricuspid valve is abnormal. Tricuspid valve  regurgitation is mild . No evidence of tricuspid stenosis.   Aortic Valve: 21 mm Edwards INSPIRIS RESILIA pericardial valve is in the  AV position with normal function. The aortic valve has been  repaired/replaced. Aortic valve regurgitation is not visualized. No aortic  stenosis is present. Aortic valve mean  gradient measures 7.5 mmHg. Aortic valve peak gradient measures 13.8 mmHg.  Aortic valve area, by VTI measures 1.47 cm.   Pulmonic Valve: The pulmonic valve was not well visualized. Pulmonic valve  regurgitation is not visualized. No evidence of pulmonic stenosis.   Aorta: The aortic root is normal in size and structure.   Venous: The inferior vena cava is dilated in size with greater than 50%  respiratory variability, suggesting right atrial pressure of 8 mmHg.   IAS/Shunts: No atrial level shunt detected by color flow Doppler.       EKG:   SR  rate 62 LBBB 07/21/22   Recent Labs: No results found for requested labs within last 365 days.  Recent Lipid Panel    Component Value Date/Time   CHOL 141 09/01/2022 1011   TRIG 142 09/01/2022 1011   HDL 40 09/01/2022 1011   CHOLHDL 3.5 09/01/2022 1011   CHOLHDL 3.4 07/04/2019 0812   VLDL 19 02/02/2017 0839   LDLCALC 76 09/01/2022 1011   LDLCALC 89 07/04/2019 0812    Home Medications   Current Meds  Medication Sig   acetaminophen (TYLENOL) 500 MG tablet Take 1,000 mg by mouth 2 (two) times daily.   amLODipine (NORVASC) 5 MG tablet Take 5 mg by mouth daily.   Apoaequorin (PREVAGEN) 10 MG CAPS Take 1 capsule by mouth daily.   aspirin EC 325 MG tablet Take 1 tablet (325 mg total) by mouth daily.   chlorthalidone (HYGROTON) 25 MG tablet Take 25 mg by mouth daily.   escitalopram (LEXAPRO) 10 MG tablet Take 10 mg by mouth at bedtime.   ezetimibe (ZETIA) 10 MG tablet  Take 1 tablet (10 mg total) by mouth daily.   lisinopril (ZESTRIL) 20 MG tablet Take 20 mg by mouth daily.   LORazepam (ATIVAN) 0.5 MG tablet Take 1 tablet (0.5 mg total) by mouth 2 (two) times daily as needed. for anxiety (Patient taking differently: Take 0.5 mg by mouth daily.)   nebivolol (BYSTOLIC) 10 MG tablet Take 10 mg by mouth daily.   rosuvastatin (CRESTOR) 40 MG tablet TAKE 1 TABLET BY MOUTH EVERY DAY   traMADol (ULTRAM) 50 MG tablet Take 1 tablet (50 mg total) by mouth every 6 (six) hours as needed for moderate pain.     Review of Systems      All other systems reviewed and are otherwise negative except as noted above.  Physical Exam    VS:  BP (!) 144/82   Pulse (!) 56   Ht 5\' 10"  (1.778 m)   Wt 175 lb (79.4 kg)   SpO2 98%   BMI 25.11 kg/m  , BMI Body mass index is 25.11 kg/m.  Wt Readings from Last 3 Encounters:  10/22/23 175 lb (79.4 kg)  10/27/22 183 lb (83 kg)  07/21/22 199 lb 3.2 oz (90.4 kg)    Affect appropriate Healthy:  appears stated age HEENT: normal Neck supple with no adenopathy JVP normal bilateral bruits no thyromegaly Lungs clear with no wheezing and good diaphragmatic motion Heart:  S1/S2 SEM through AVR no AR murmur, no rub, gallop or click PMI normal post sternotomy Abdomen: benighn, BS positve, no tenderness, no AAA no bruit.  No HSM or HJR Post left radial artery harvest  No edema Neuro non-focal Skin warm and dry No muscular weakness   Assessment & Plan    CAD status post redo CABG x3, severe AS status post redo AVR -Functional status ok  -no angina continue medical Rx  - EF normal and gradients good across AVR by TTE 04/10/22 -  SBE prophylaxis discussed   Postop Atrial fibrillation -Maintaining NSR amiodarone d/c  - post operative arrhythmia not on anticoagulation   Hypertension -improved  - continue current meds   Hyperlipidemia -LDL was 76 on 09/01/22 this was on 40 mg of Crestor - was supposed to start Zetia 10 mg  09/05/22 discussed this with him and script called in  5. Bruit:   -bilateral carotid vs referred AVR murmur Pre CABG duplex 2023 with plaque no stenosis will update    Zetia 10 mg called  back in  Carotid duplex  Disposition: Follow up in a year   Signed, Charlton Haws, MD 10/22/2023, 11:12 AM [facility]

## 2023-10-22 ENCOUNTER — Ambulatory Visit: Payer: Medicare HMO | Attending: Cardiovascular Disease | Admitting: Cardiovascular Disease

## 2023-10-22 ENCOUNTER — Encounter: Payer: Self-pay | Admitting: Cardiovascular Disease

## 2023-10-22 VITALS — BP 144/82 | HR 56 | Ht 70.0 in | Wt 175.0 lb

## 2023-10-22 DIAGNOSIS — Z951 Presence of aortocoronary bypass graft: Secondary | ICD-10-CM

## 2023-10-22 DIAGNOSIS — R0989 Other specified symptoms and signs involving the circulatory and respiratory systems: Secondary | ICD-10-CM | POA: Diagnosis not present

## 2023-10-22 DIAGNOSIS — I4891 Unspecified atrial fibrillation: Secondary | ICD-10-CM | POA: Diagnosis not present

## 2023-10-22 DIAGNOSIS — Z952 Presence of prosthetic heart valve: Secondary | ICD-10-CM | POA: Diagnosis not present

## 2023-10-22 NOTE — Patient Instructions (Signed)
 Medication Instructions:  Your physician recommends that you continue on your current medications as directed. Please refer to the Current Medication list given to you today.  *If you need a refill on your cardiac medications before your next appointment, please call your pharmacy*   Lab Work: NONE   If you have labs (blood work) drawn today and your tests are completely normal, you will receive your results only by: MyChart Message (if you have MyChart) OR A paper copy in the mail If you have any lab test that is abnormal or we need to change your treatment, we will call you to review the results.   Testing/Procedures: Your physician has requested that you have a carotid duplex. This test is an ultrasound of the carotid arteries in your neck. It looks at blood flow through these arteries that supply the brain with blood. Allow one hour for this exam. There are no restrictions or special instructions.    Follow-Up: At [facility], you and your health needs are our priority.  As part of our continuing mission to provide you with exceptional heart care, we have created designated Provider Care Teams.  These Care Teams include your primary Cardiologist (physician) and Advanced Practice Providers (APPs -  Physician Assistants and Nurse Practitioners) who all work together to provide you with the care you need, when you need it.  We recommend signing up for the patient portal called "MyChart".  Sign up information is provided on this After Visit Summary.  MyChart is used to connect with patients for Virtual Visits (Telemedicine).  Patients are able to view lab/test results, encounter notes, upcoming appointments, etc.  Non-urgent messages can be sent to your provider as well.   To learn more about what you can do with MyChart, go to [email or web address].    Your next appointment:   1 year(s)  Provider:   You may see Charlton Haws, MD or one of the following Advanced Practice  Providers on your designated Care Team:   Randall An, PA-C  Jacolyn Reedy, PA-C     Other Instructions Thank you for choosing [facility]!

## 2023-10-30 ENCOUNTER — Ambulatory Visit (HOSPITAL_COMMUNITY)
Admission: RE | Admit: 2023-10-30 | Discharge: 2023-10-30 | Disposition: A | Discharge status: Home / Self Care | Source: Ambulatory Visit | Attending: Cardiovascular Disease | Admitting: Cardiovascular Disease

## 2023-10-30 DIAGNOSIS — I6523 Occlusion and stenosis of bilateral carotid arteries: Secondary | ICD-10-CM | POA: Diagnosis not present

## 2023-10-30 DIAGNOSIS — R0989 Other specified symptoms and signs involving the circulatory and respiratory systems: Secondary | ICD-10-CM | POA: Insufficient documentation

## 2023-11-12 ENCOUNTER — Other Ambulatory Visit: Payer: Self-pay | Admitting: Cardiovascular Disease

## 2024-01-07 DIAGNOSIS — I10 Essential (primary) hypertension: Secondary | ICD-10-CM | POA: Diagnosis not present

## 2024-01-07 DIAGNOSIS — F419 Anxiety disorder, unspecified: Secondary | ICD-10-CM | POA: Diagnosis not present

## 2024-01-07 DIAGNOSIS — R42 Dizziness and giddiness: Secondary | ICD-10-CM | POA: Diagnosis not present

## 2024-01-07 DIAGNOSIS — Z125 Encounter for screening for malignant neoplasm of prostate: Secondary | ICD-10-CM | POA: Diagnosis not present

## 2024-01-07 DIAGNOSIS — E1129 Type 2 diabetes mellitus with other diabetic kidney complication: Secondary | ICD-10-CM | POA: Diagnosis not present

## 2024-01-07 DIAGNOSIS — Z79899 Other long term (current) drug therapy: Secondary | ICD-10-CM | POA: Diagnosis not present

## 2024-01-07 DIAGNOSIS — I2510 Atherosclerotic heart disease of native coronary artery without angina pectoris: Secondary | ICD-10-CM | POA: Diagnosis not present

## 2024-01-14 DIAGNOSIS — E1169 Type 2 diabetes mellitus with other specified complication: Secondary | ICD-10-CM | POA: Diagnosis not present

## 2024-01-14 DIAGNOSIS — E785 Hyperlipidemia, unspecified: Secondary | ICD-10-CM | POA: Diagnosis not present

## 2024-01-14 DIAGNOSIS — I10 Essential (primary) hypertension: Secondary | ICD-10-CM | POA: Diagnosis not present

## 2024-02-23 DIAGNOSIS — H5203 Hypermetropia, bilateral: Secondary | ICD-10-CM | POA: Diagnosis not present

## 2024-05-10 DIAGNOSIS — E1129 Type 2 diabetes mellitus with other diabetic kidney complication: Secondary | ICD-10-CM | POA: Diagnosis not present

## 2024-05-10 DIAGNOSIS — I10 Essential (primary) hypertension: Secondary | ICD-10-CM | POA: Diagnosis not present

## 2024-05-10 DIAGNOSIS — D519 Vitamin B12 deficiency anemia, unspecified: Secondary | ICD-10-CM | POA: Diagnosis not present

## 2024-05-17 DIAGNOSIS — F322 Major depressive disorder, single episode, severe without psychotic features: Secondary | ICD-10-CM | POA: Diagnosis not present

## 2024-05-17 DIAGNOSIS — I2510 Atherosclerotic heart disease of native coronary artery without angina pectoris: Secondary | ICD-10-CM | POA: Diagnosis not present

## 2024-05-17 DIAGNOSIS — I10 Essential (primary) hypertension: Secondary | ICD-10-CM | POA: Diagnosis not present

## 2024-05-17 DIAGNOSIS — E1151 Type 2 diabetes mellitus with diabetic peripheral angiopathy without gangrene: Secondary | ICD-10-CM | POA: Diagnosis not present

## 2024-05-17 DIAGNOSIS — E1169 Type 2 diabetes mellitus with other specified complication: Secondary | ICD-10-CM | POA: Diagnosis not present
